# Patient Record
Sex: Male | Born: 1950
Health system: Southern US, Community
[De-identification: ages and names within clinical notes are randomized; demographics above are authoritative.]

## PROBLEM LIST (undated history)

## (undated) DIAGNOSIS — I700 Atherosclerosis of aorta: Secondary | ICD-10-CM

## (undated) DIAGNOSIS — I10 Essential (primary) hypertension: Secondary | ICD-10-CM

## (undated) DIAGNOSIS — K224 Dyskinesia of esophagus: Secondary | ICD-10-CM

## (undated) DIAGNOSIS — G20 Parkinson's disease: Principal | ICD-10-CM

## (undated) DIAGNOSIS — K921 Melena: Secondary | ICD-10-CM

## (undated) DIAGNOSIS — E7800 Pure hypercholesterolemia, unspecified: Secondary | ICD-10-CM

## (undated) HISTORY — DX: Essential (primary) hypertension: I10

## (undated) HISTORY — DX: Atherosclerosis of aorta: I70.0

## (undated) HISTORY — DX: Melena: K92.1

## (undated) HISTORY — DX: Dyskinesia of esophagus: K22.4

## (undated) HISTORY — DX: Pure hypercholesterolemia, unspecified: E78.00

## (undated) HISTORY — DX: Parkinson's disease: G20

---

## 2000-03-27 ENCOUNTER — Ambulatory Visit (HOSPITAL_COMMUNITY): Admission: RE | Admit: 2000-03-27 | Discharge: 2000-03-27 | Payer: Self-pay | Admitting: Gastroenterology

## 2000-03-27 ENCOUNTER — Encounter (INDEPENDENT_AMBULATORY_CARE_PROVIDER_SITE_OTHER): Payer: Self-pay | Admitting: *Deleted

## 2001-02-06 ENCOUNTER — Emergency Department (HOSPITAL_COMMUNITY): Admission: EM | Admit: 2001-02-06 | Discharge: 2001-02-06 | Payer: Self-pay | Admitting: Emergency Medicine

## 2002-06-14 ENCOUNTER — Encounter: Admission: RE | Admit: 2002-06-14 | Discharge: 2002-06-14 | Payer: Self-pay | Admitting: Family Medicine

## 2002-06-14 ENCOUNTER — Encounter: Payer: Self-pay | Admitting: Family Medicine

## 2002-07-06 ENCOUNTER — Encounter: Payer: Self-pay | Admitting: Neurology

## 2002-07-06 ENCOUNTER — Ambulatory Visit (HOSPITAL_COMMUNITY): Admission: RE | Admit: 2002-07-06 | Discharge: 2002-07-06 | Payer: Self-pay | Admitting: Neurology

## 2002-07-08 ENCOUNTER — Ambulatory Visit (HOSPITAL_COMMUNITY): Admission: RE | Admit: 2002-07-08 | Discharge: 2002-07-08 | Payer: Self-pay | Admitting: Neurology

## 2006-05-19 ENCOUNTER — Emergency Department (HOSPITAL_COMMUNITY): Admission: EM | Admit: 2006-05-19 | Discharge: 2006-05-19 | Payer: Self-pay | Admitting: Emergency Medicine

## 2008-10-17 DIAGNOSIS — K921 Melena: Secondary | ICD-10-CM

## 2008-10-17 HISTORY — DX: Melena: K92.1

## 2010-06-23 ENCOUNTER — Ambulatory Visit (HOSPITAL_COMMUNITY): Admission: RE | Admit: 2010-06-23 | Discharge: 2010-06-23 | Payer: Self-pay | Admitting: General Surgery

## 2010-06-23 HISTORY — PX: HERNIA REPAIR: SHX51

## 2010-12-30 LAB — BASIC METABOLIC PANEL
BUN: 19 mg/dL (ref 6–23)
CO2: 27 mEq/L (ref 19–32)
Calcium: 9.5 mg/dL (ref 8.4–10.5)
Chloride: 107 mEq/L (ref 96–112)
Creatinine, Ser: 1.1 mg/dL (ref 0.4–1.5)
GFR calc Af Amer: >60 mL/min (ref >60)
GFR calc non Af Amer: >60 mL/min (ref >60)
Glucose, Bld: 109 mg/dL — ABNORMAL HIGH (ref 70–99)
Potassium: 3.6 mEq/L (ref 3.5–5.1)
Sodium: 140 mEq/L (ref 135–145)

## 2010-12-30 LAB — CBC
Hemoglobin: 14.5 g/dL (ref 13.0–17.0)
MCHC: 34.4 g/dL (ref 30.0–36.0)
Platelets: 261 10*3/uL (ref 150–400)
RDW: 12.9 % (ref 11.5–15.5)

## 2010-12-30 LAB — SURGICAL PCR SCREEN
MRSA, PCR: NEGATIVE
Staphylococcus aureus: POSITIVE — AB

## 2010-12-30 LAB — DIFFERENTIAL
Basophils Absolute: 0 10*3/uL (ref 0.0–0.1)
Basophils Relative: 1 % (ref 0–1)
Neutro Abs: 4.9 10*3/uL (ref 1.7–7.7)
Neutrophils Relative %: 64 % (ref 43–77)

## 2011-03-04 NOTE — Op Note (Signed)
   NAME:  Jeremy Davidson, Jeremy Davidson NO.:  [id number]   MEDICAL RECORD NO.:  [id number]                   PATIENT TYPE:  OUT   LOCATION:  [facility]                                  FACILITY:  [facility]   PHYSICIAN:  Marlan Palau, M.D.               DATE OF BIRTH:  12/05/1950   DATE OF PROCEDURE:  07/08/2002  DATE OF DISCHARGE:                                 OPERATIVE REPORT   PROCEDURE:  Lumbar puncture.   HISTORY:  This is a 60 year old gentleman with paresthesias in both lower  extremities and the left upper extremity.  This patient is being evaluated  for possible transverse myelitis.  Rule out demyelinating disease.   DESCRIPTION OF PROCEDURE:  Lumbar puncture was performed with the patient in  the fetal position on the left side.  The low back was cleaned with Betadine  solution, and approximately 2 cc of 1% Xylocaine was used as a local  anesthetic.  A 20-gauge spinal needle was inserted in the L3-4 interspace,  and approximately 18 cc of clear, colorless spinal fluid was removed for  testing.  Opening pressure was 180 mmH2O.  Tube #1 was sent for VDRL,  cryptococcal antigen; tube #2 was sent for oligoclonal banding, IgG/albumin  ratio; tube #3 was sent for cells, differential, glucose, protein; tube #4  was sent for Lyme antibody panel.  Blood work was also obtained for  sedimentation rate, ANA, rheumatoid factor, ANCA level, antiphospholipid  antibody panel, protein S, protein C, antithrombin-3 level, homocysteine  level.  The patient tolerated the procedure well.  No complications of the  above procedure were noted.                                               Marlan Palau, M.D.    CKW/MEDQ  D:  07/08/2002  T:  07/09/2002  Job:  [id number]

## 2011-03-04 NOTE — Procedures (Signed)
[place]  Patient:    Jeremy Davidson, Jeremy Davidson                    MRN: [id number] Proc. Date: 03/27/00 Adm. Date:  [id number] Disc. Date: [id number] Attending:  Nelda Marseille CC:         Vale [place]. Andrey Campanile, M.D.                           Procedure Report  PROCEDURE PERFORMED:  Colonoscopy with biopsy.  INDICATION:  The patient with a family history of colon cancer due for colonic screening. Some bright red blood per rectum. Consent was signed after risks benefits, methods and options were thoroughly discussed in the office.  MEDICATIONS USED: Demerol 60 mg, Versed 6 mg.  DESCRIPTION OF PROCEDURE:  Rectal inspection is pertinent for external hemorrhoids. Digital exam was negative.  Video colonoscope was inserted and easily advanced around the colon to the cecum. This did not require any abdominal pressure or any position changes.  The cecum was identified by the appendiceal orifice and the ileocecal valve. In fact, the scope was inserted a short way into the terminal ileum which was normal. Photo documentation was obtained.  The scope was then slowly withdrawn and the prep was adequate. There was some liquid stool that required washing and suctioning.  On slow withdrawal through the cecum, ascending, transverse, descending and majority of the sigmoid were normal and the rectal sigmoid junction two tiny hyperplastic appearing polyps were seen and were each cold biopsied times one to two.  The scope as withdrawn back to the rectum and retroflexed, pertinent for some internal hemorrhoids.  The scope was straightened and readvanced a short way of the cecum.  The air was suctioned and the scope removed.  The patient tolerated the procedure well. There was no evidence of immediate complication.  ENDOSCOPIC DIAGNOSES: 1. Internal and external hemorrhoids. 2. Two tiny hyperplastic appearing rectal sigmoid junction polyps status post    cold biopsy. 3.  Otherwise within normal limits to the terminal ileum.  PLAN:  Yearly rectals and guaiac per Dr. Andrey Campanile.  GI follow up p.r.n.  Await pathology.  Probably recheck colon screening in five years. DD:  03/27/00 TD:  03/29/00 Job: [id number]

## 2011-10-17 ENCOUNTER — Other Ambulatory Visit: Payer: Self-pay | Admitting: Family Medicine

## 2011-10-17 DIAGNOSIS — R42 Dizziness and giddiness: Secondary | ICD-10-CM

## 2011-10-21 ENCOUNTER — Ambulatory Visit
Admission: RE | Admit: 2011-10-21 | Discharge: 2011-10-21 | Disposition: A | Discharge status: Home / Self Care | Payer: BC Managed Care – PPO | Source: Ambulatory Visit | Attending: Family Medicine | Admitting: Family Medicine

## 2011-10-21 ENCOUNTER — Other Ambulatory Visit: Payer: Self-pay

## 2011-10-21 DIAGNOSIS — R42 Dizziness and giddiness: Secondary | ICD-10-CM

## 2014-05-27 ENCOUNTER — Other Ambulatory Visit: Payer: Self-pay | Admitting: Family Medicine

## 2014-05-27 DIAGNOSIS — Z139 Encounter for screening, unspecified: Secondary | ICD-10-CM

## 2014-06-04 ENCOUNTER — Ambulatory Visit: Payer: BC Managed Care – PPO

## 2014-06-06 ENCOUNTER — Encounter (INDEPENDENT_AMBULATORY_CARE_PROVIDER_SITE_OTHER): Payer: Self-pay

## 2014-06-06 ENCOUNTER — Ambulatory Visit
Admission: RE | Admit: 2014-06-06 | Discharge: 2014-06-06 | Disposition: A | Discharge status: Home / Self Care | Payer: Medicare HMO | Source: Ambulatory Visit | Attending: Family Medicine | Admitting: Family Medicine

## 2014-06-06 DIAGNOSIS — Z139 Encounter for screening, unspecified: Secondary | ICD-10-CM

## 2015-07-20 DIAGNOSIS — Z23 Encounter for immunization: Secondary | ICD-10-CM | POA: Diagnosis not present

## 2015-09-09 ENCOUNTER — Ambulatory Visit (INDEPENDENT_AMBULATORY_CARE_PROVIDER_SITE_OTHER): Payer: Medicare HMO | Admitting: Emergency Medicine

## 2015-09-09 VITALS — BP 128/72 | HR 71 | Temp 97.4°F | Resp 19 | Ht 71.0 in | Wt 188.0 lb

## 2015-09-09 DIAGNOSIS — I10 Essential (primary) hypertension: Secondary | ICD-10-CM | POA: Diagnosis not present

## 2015-09-09 DIAGNOSIS — E785 Hyperlipidemia, unspecified: Secondary | ICD-10-CM | POA: Diagnosis not present

## 2015-09-09 DIAGNOSIS — L03012 Cellulitis of left finger: Secondary | ICD-10-CM

## 2015-09-09 HISTORY — DX: Essential (primary) hypertension: I10

## 2015-09-09 HISTORY — DX: Hyperlipidemia, unspecified: E78.5

## 2015-09-09 MED ORDER — DOXYCYCLINE HYCLATE 100 MG PO TABS: 100.0000 mg | ORAL_TABLET | Freq: Two times a day (BID) | ORAL | Status: DC

## 2015-09-09 NOTE — Patient Instructions (Signed)
Paronychia °Paronychia is an infection of the skin that surrounds a nail. It usually affects the skin around a fingernail, but it may also occur near a toenail. It often causes pain and swelling around the nail. This condition may come on suddenly or develop over a longer period. In some cases, a collection of pus (abscess) can form near or under the nail. Usually, paronychia is not serious and it clears up with treatment. °CAUSES °This condition may be caused by bacteria or fungi. It is commonly caused by either Streptococcus or Staphylococcus bacteria. The bacteria or fungi often cause the infection by getting into the affected area through an opening in the skin, such as a cut or a hangnail. °RISK FACTORS °This condition is more likely to develop in: °· People who get their hands wet often, such as those who work as dishwashers, bartenders, or nurses. °· People who bite their fingernails or suck their thumbs. °· People who trim their nails too short. °· People who have hangnails or injured fingertips. °· People who get manicures. °· People who have diabetes. °SYMPTOMS °Symptoms of this condition include: °· Redness and swelling of the skin near the nail. °· Tenderness around the nail when you touch the area. °· Pus-filled bumps under the cuticle. The cuticle is the skin at the base or sides of the nail. °· Fluid or pus under the nail. °· Throbbing pain in the area. °DIAGNOSIS °This condition is usually diagnosed with a physical exam. In some cases, a sample of pus may be taken from an abscess to be tested in a lab. This can help to determine what type of bacteria or fungi is causing the condition. °TREATMENT °Treatment for this condition depends on the cause and severity of the condition. If the condition is mild, it may clear up on its own in a few days. Your health care provider may recommend soaking the affected area in warm water a few times a day. When treatment is needed, the options may  include: °· Antibiotic medicine, if the condition is caused by a bacterial infection. °· Antifungal medicine, if the condition is caused by a fungal infection. °· Incision and drainage, if an abscess is present. In this procedure, the health care provider will cut open the abscess so the pus can drain out. °HOME CARE INSTRUCTIONS °· Soak the affected area in warm water if directed to do so by your health care provider. You may be told to do this for 20 minutes, 2-3 times a day. Keep the area dry in between soakings. °· Take medicines only as directed by your health care provider. °· If you were prescribed an antibiotic medicine, finish all of it even if you start to feel better. °· Keep the affected area clean. °· Do not try to drain a fluid-filled bump yourself. °· If you will be washing dishes or performing other tasks that require your hands to get wet, wear rubber gloves. You should also wear gloves if your hands might come in contact with irritating substances, such as cleaners or chemicals. °· Follow your health care provider's instructions about: °¨ Wound care. °¨ Bandage (dressing) changes and removal. °SEEK MEDICAL CARE IF: °· Your symptoms get worse or do not improve with treatment. °· You have a fever or chills. °· You have redness spreading from the affected area. °· You have continued or increased fluid, blood, or pus coming from the affected area. °· Your finger or knuckle becomes swollen or is difficult to move. °  °  This information is not intended to replace advice given to you by your health care provider. Make sure you discuss any questions you have with your health care provider. °  °Document Released: 03/29/2001 Document Revised: 02/17/2015 Document Reviewed: 09/10/2014 °Elsevier Interactive Patient Education ©2016 [employer] ° °

## 2015-09-09 NOTE — Addendum Note (Signed)
Addended by: Arlyss Queen A on: 09/09/2015 02:54 PM   Modules accepted: Level of Service

## 2015-09-09 NOTE — Progress Notes (Signed)
Patient ID: Jeremy Davidson, male   DOB: [birth year], 64 y.o.   MRN: [id number]     This chart was scribed for Arlyss Queen, MD by Zola Button, Medical Scribe. This patient was seen in room 3 and the patient's care was started at 2:24 PM.   Chief Complaint:  Chief Complaint  Patient presents with  . Other    lft.thumb swelling     HPI: Jeremy Davidson is a 64 y.o. male who reports to [facility] today complaining of gradual onset, left thumb pain that started about a week ago. He has noticed an area of swelling and redness within the past few days. Patient denies known injury and history of DM. He believes he is UTD on tetanus. He has been receiving most of his care at [facility].  Patient has 3 children and 6 grandchildren, soon to be 66.  Past Medical History  Diagnosis Date  . Hypertension   . Elevated cholesterol   . Blood in stool 2010    Colonoscopy benign repeat 2015   Past Surgical History  Procedure Laterality Date  . Hernia repair  06/23/2010    With large ultra pro hrnia system-- Dr Donne Hazel 1971   Social History   Social History  . Marital Status: Married    Spouse Name: N/A  . Number of Children: N/A  . Years of Education: N/A   Social History Main Topics  . Smoking status: Former Smoker    Quit date: 10/17/1998  . Smokeless tobacco: None  . Alcohol Use: Yes  . Drug Use: None  . Sexual Activity: Not Asked   Other Topics Concern  . None   Social History Narrative   History reviewed. No pertinent family history. No Known Allergies Prior to Admission medications   Medication Sig Start Date End Date Taking? Authorizing Provider  atorvastatin (LIPITOR) 40 MG tablet Take 40 mg by mouth at bedtime.   Yes Historical Provider, MD  hydrochlorothiazide (HYDRODIURIL) 25 MG tablet Take 25 mg by mouth daily.   Yes Historical Provider, MD  Sildenafil Citrate (VIAGRA PO) Take by mouth as needed.   Yes Historical Provider, MD     ROS: The patient denies fevers,  chills, night sweats, unintentional weight loss, chest pain, palpitations, wheezing, dyspnea on exertion, nausea, vomiting, abdominal pain, dysuria, hematuria, melena, numbness, weakness, or tingling.  All other systems have been reviewed and were otherwise negative with the exception of those mentioned in the HPI and as above.    PHYSICAL EXAM: Filed Vitals:   09/09/15 1420  BP: 128/72  Pulse: 71  Temp: 97.4 F (36.3 C)  Resp: 19   Body mass index is 26.23 kg/(m^2).   General: Alert, no acute distress HEENT:  Normocephalic, atraumatic, oropharynx patent. Eye: Jeremy Davidson [facility] Cardiovascular:  Regular rate and rhythm, no rubs murmurs or gallops.  No Carotid bruits, radial pulse intact. No pedal edema.  Respiratory: Clear to auscultation bilaterally.  No wheezes, rales, or rhonchi.  No cyanosis, no use of accessory musculature Abdominal: No organomegaly, abdomen is soft and non-tender, positive bowel sounds.  No masses. Musculoskeletal: Gait intact. No edema, tenderness Skin: He has a 1 cm x 0.75 cm purulent blister, medial left thumb with mild redness of the cuticle. Neurologic: Facial musculature symmetric. Psychiatric: Patient acts appropriately throughout our interaction. Lymphatic: No cervical or submandibular lymphadenopathy Procedure note The thumb was prepped with Betadine half centimeter incision made over the wound abscess with a droplet of purulent material obtained and sent for  culture.  LABS:    EKG/XRAY:   Primary read interpreted by Dr. Everlene Farrier at [facility].   ASSESSMENT/PLAN: Patient presents with a paronychia with abscess. The abscess was opened and sent for culture. He will be on doxycycline as well as soap and water cleaning.I personally performed the services described in this documentation, which was scribed in my presence. The recorded information has been reviewed and is accurate.  By signing my name below, I, Zola Button, attest that this documentation has been  prepared under the direction and in the presence of Arlyss Queen, MD.  Electronically Signed: Zola Button, Medical Scribe. 09/09/2015. 2:24 PM.   Gross sideeffects, risk and benefits, and alternatives of medications d/w patient. Patient is aware that all medications have potential sideeffects and we are unable to predict every sideeffect or drug-drug interaction that may occur.  Arlyss Queen MD 09/09/2015 2:24 PM

## 2015-09-12 ENCOUNTER — Telehealth: Payer: Self-pay | Admitting: *Deleted

## 2015-09-12 DIAGNOSIS — L03012 Cellulitis of left finger: Secondary | ICD-10-CM

## 2015-09-12 LAB — WOUND CULTURE: GRAM STAIN: NONE SEEN

## 2015-09-12 MED ORDER — CIPROFLOXACIN HCL 500 MG PO TABS: 500.0000 mg | ORAL_TABLET | Freq: Two times a day (BID) | ORAL | Status: DC

## 2015-09-12 NOTE — Telephone Encounter (Signed)
Pt called about lab results of his wound culture.  He has not picked up his medication because its $29 and wanted to know if he really need it or not.   Advise he take Cipro 500 BID for 5 days. # 10 no refills. Thanks.  Land[place]. Do not take Doxy.  Cipro sent into pharmacy  Pt called and notified

## 2015-09-15 DIAGNOSIS — R69 Illness, unspecified: Secondary | ICD-10-CM | POA: Diagnosis not present

## 2015-10-30 DIAGNOSIS — L570 Actinic keratosis: Secondary | ICD-10-CM | POA: Diagnosis not present

## 2015-10-30 DIAGNOSIS — D1801 Hemangioma of skin and subcutaneous tissue: Secondary | ICD-10-CM | POA: Diagnosis not present

## 2015-10-30 DIAGNOSIS — D225 Melanocytic nevi of trunk: Secondary | ICD-10-CM | POA: Diagnosis not present

## 2015-10-30 DIAGNOSIS — L821 Other seborrheic keratosis: Secondary | ICD-10-CM | POA: Diagnosis not present

## 2015-10-30 DIAGNOSIS — L814 Other melanin hyperpigmentation: Secondary | ICD-10-CM | POA: Diagnosis not present

## 2015-11-04 ENCOUNTER — Other Ambulatory Visit: Payer: Self-pay | Admitting: Emergency Medicine

## 2016-02-29 DIAGNOSIS — B029 Zoster without complications: Secondary | ICD-10-CM | POA: Diagnosis not present

## 2016-02-29 DIAGNOSIS — K068 Other specified disorders of gingiva and edentulous alveolar ridge: Secondary | ICD-10-CM | POA: Diagnosis not present

## 2016-03-02 DIAGNOSIS — Z0000 Encounter for general adult medical examination without abnormal findings: Secondary | ICD-10-CM | POA: Diagnosis not present

## 2016-03-02 DIAGNOSIS — E785 Hyperlipidemia, unspecified: Secondary | ICD-10-CM | POA: Diagnosis not present

## 2016-03-02 DIAGNOSIS — I10 Essential (primary) hypertension: Secondary | ICD-10-CM | POA: Diagnosis not present

## 2016-03-15 DIAGNOSIS — R69 Illness, unspecified: Secondary | ICD-10-CM | POA: Diagnosis not present

## 2016-03-23 DIAGNOSIS — Z23 Encounter for immunization: Secondary | ICD-10-CM | POA: Diagnosis not present

## 2016-04-12 DIAGNOSIS — N5201 Erectile dysfunction due to arterial insufficiency: Secondary | ICD-10-CM | POA: Diagnosis not present

## 2016-04-12 DIAGNOSIS — Z125 Encounter for screening for malignant neoplasm of prostate: Secondary | ICD-10-CM | POA: Diagnosis not present

## 2016-04-12 DIAGNOSIS — R351 Nocturia: Secondary | ICD-10-CM | POA: Diagnosis not present

## 2016-05-08 DIAGNOSIS — S91111A Laceration without foreign body of right great toe without damage to nail, initial encounter: Secondary | ICD-10-CM | POA: Diagnosis not present

## 2016-07-06 DIAGNOSIS — Z23 Encounter for immunization: Secondary | ICD-10-CM | POA: Diagnosis not present

## 2016-07-08 IMAGING — US US AORTA SCREENING (MEDICARE)
1 series · 13 of 13 positions shown · non-contrast
Comparison: None.

CLINICAL DATA: Medicare screening exam for abdominal aortic
aneurysm.

EXAM:
ABDOMINAL AORTA SCREENING ULTRASOUND
TECHNIQUE: Ultrasound examination of the abdominal aorta was performed as a
screening evaluation for abdominal aortic aneurysm.

[Series 1: us aorta screening (medicare) · 0.41mm/px · 13 of 13 slices shown]
[im 1/13]
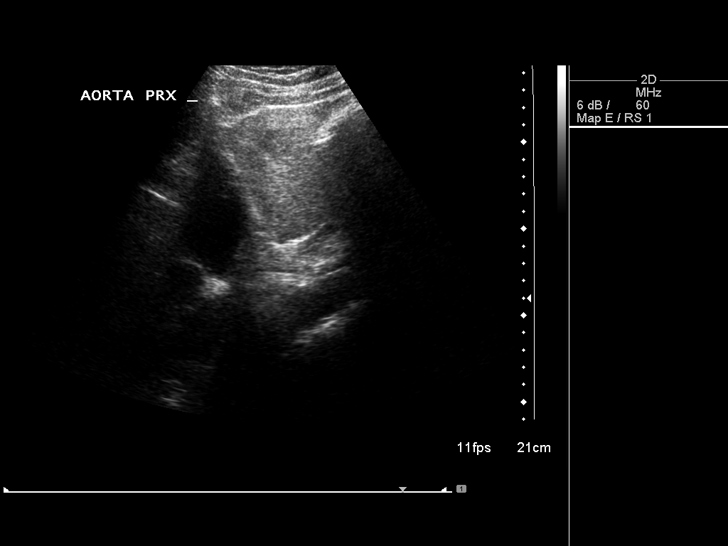
[im 2/13]
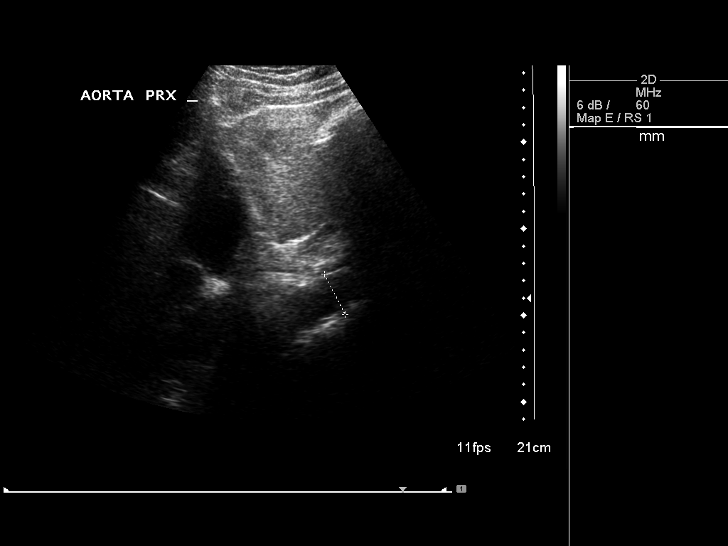
[im 3/13]
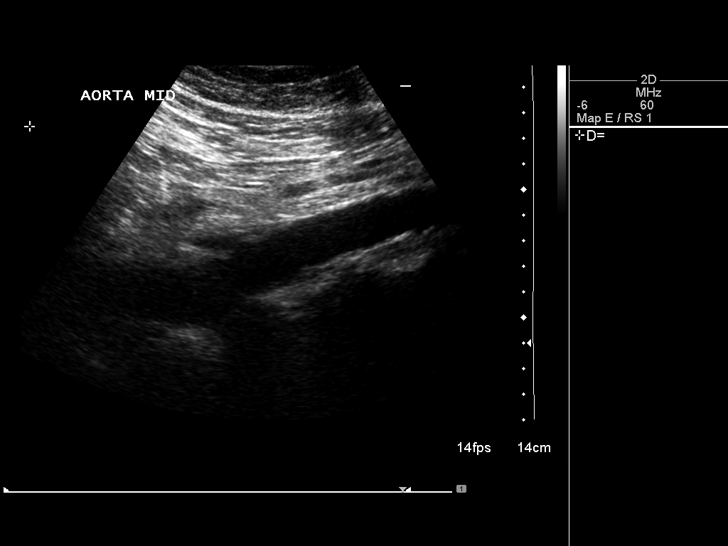
[im 4/13]
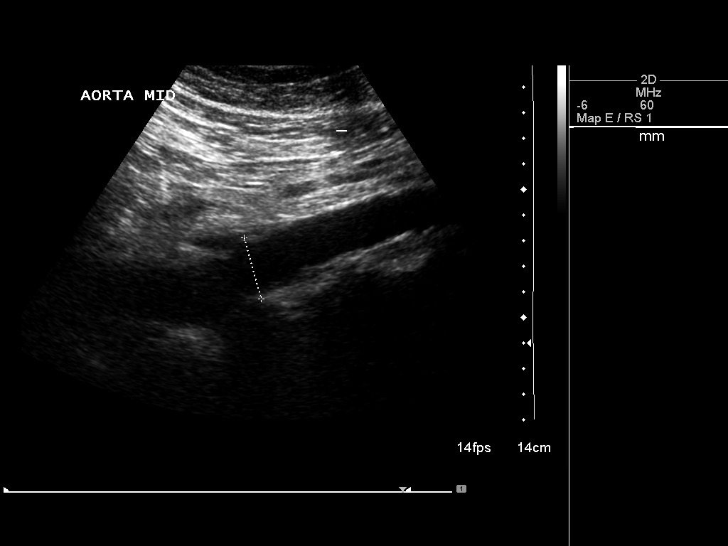
[im 5/13]
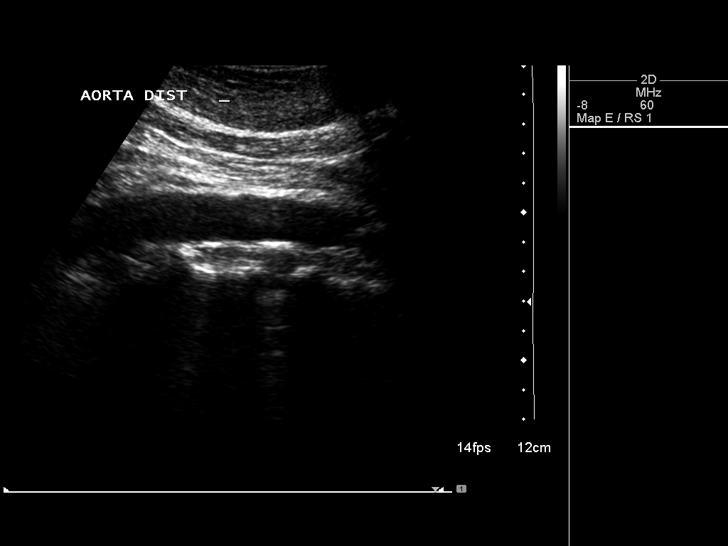
[im 6/13]
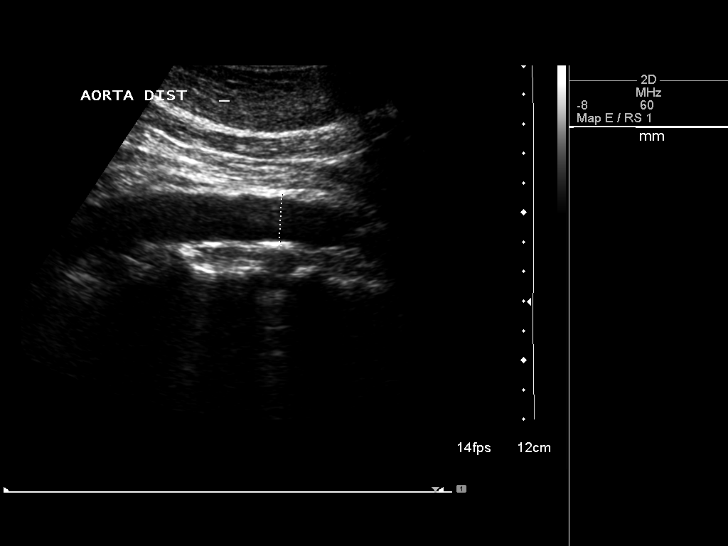
[im 7/13]
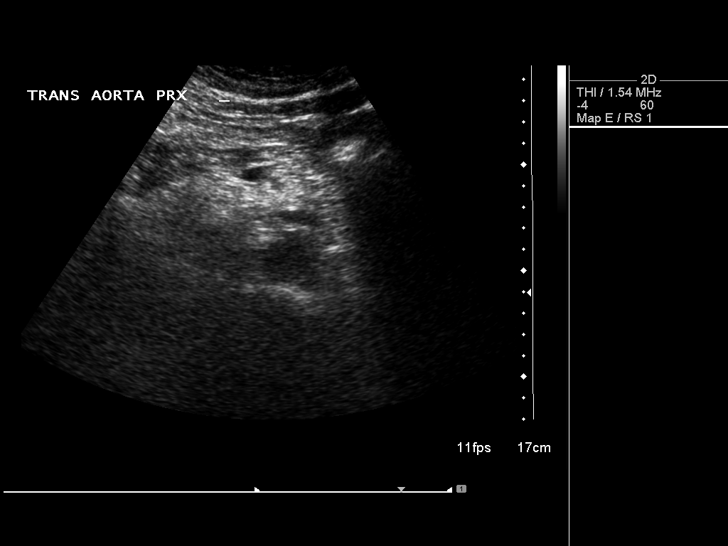
[im 8/13]
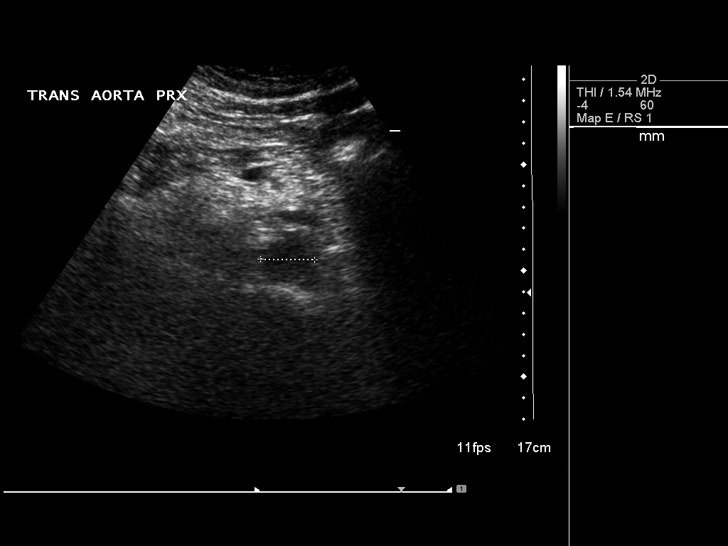
[im 9/13]
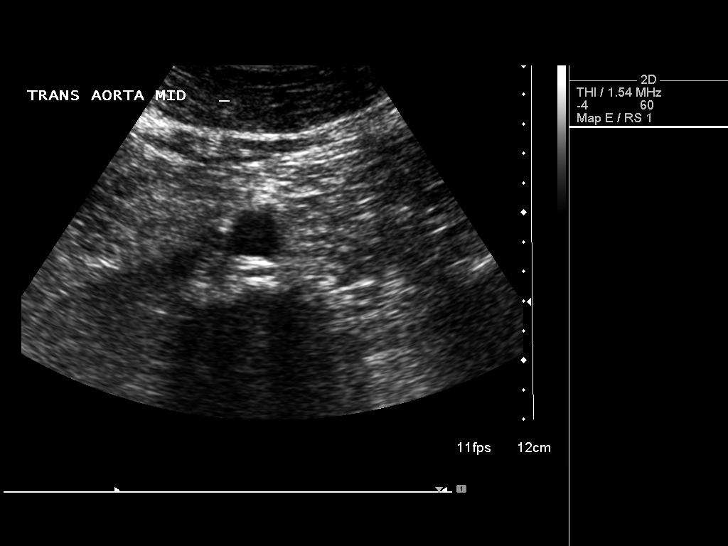
[im 10/13]
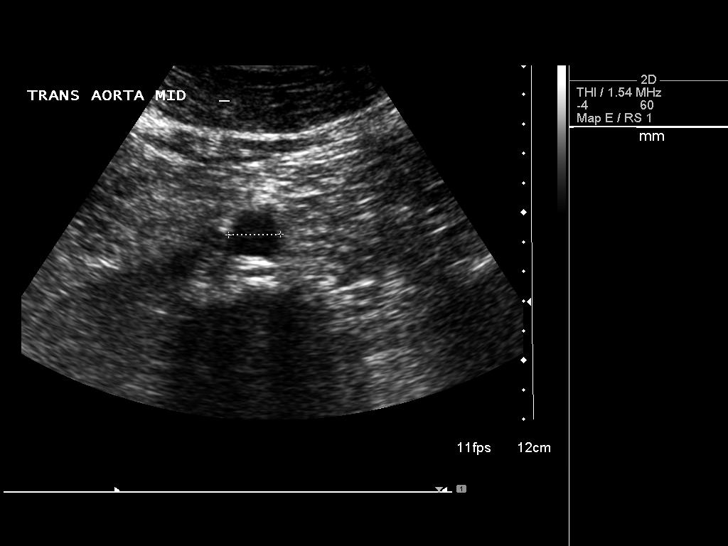
[im 11/13]
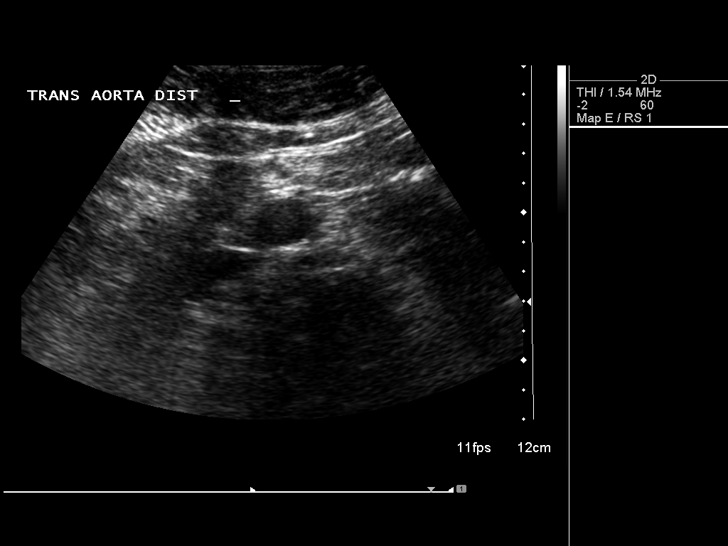
[im 12/13]
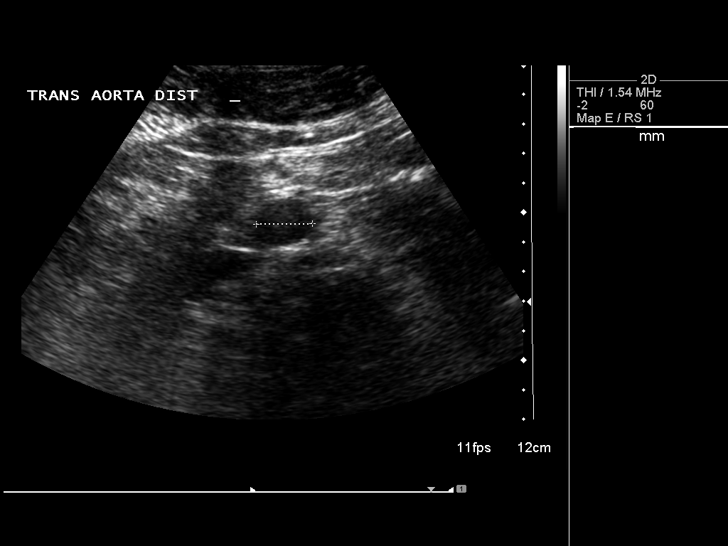
[im 13/13]
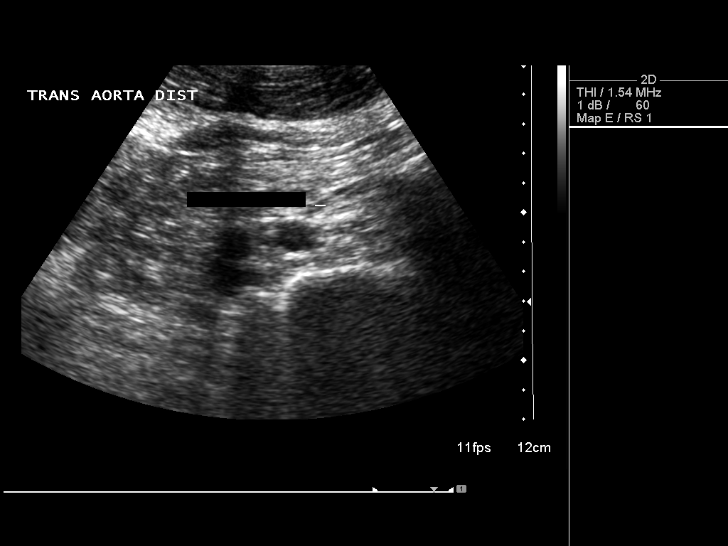

[13 of 13 positions shown; findings below may reference images not displayed]

FINDINGS: Abdominal Aorta

No aneurysm identified. There is slight atherosclerosis in the
abdominal aorta.

Maximum AP

Diameter:  2.6 cm

Maximum TRV

Diameter: 2.6 cm
IMPRESSION: No evidence of abdominal aortic aneurysm. Slight atherosclerotic
irregularity.

## 2016-07-11 DIAGNOSIS — H52223 Regular astigmatism, bilateral: Secondary | ICD-10-CM | POA: Diagnosis not present

## 2016-07-11 DIAGNOSIS — H524 Presbyopia: Secondary | ICD-10-CM | POA: Diagnosis not present

## 2016-07-11 DIAGNOSIS — H2513 Age-related nuclear cataract, bilateral: Secondary | ICD-10-CM | POA: Diagnosis not present

## 2016-07-11 DIAGNOSIS — H35371 Puckering of macula, right eye: Secondary | ICD-10-CM | POA: Diagnosis not present

## 2016-07-11 DIAGNOSIS — H5203 Hypermetropia, bilateral: Secondary | ICD-10-CM | POA: Diagnosis not present

## 2016-09-14 DIAGNOSIS — R69 Illness, unspecified: Secondary | ICD-10-CM | POA: Diagnosis not present

## 2016-10-12 DIAGNOSIS — R69 Illness, unspecified: Secondary | ICD-10-CM | POA: Diagnosis not present

## 2016-10-21 DIAGNOSIS — D225 Melanocytic nevi of trunk: Secondary | ICD-10-CM | POA: Diagnosis not present

## 2016-10-21 DIAGNOSIS — L570 Actinic keratosis: Secondary | ICD-10-CM | POA: Diagnosis not present

## 2016-10-21 DIAGNOSIS — C44519 Basal cell carcinoma of skin of other part of trunk: Secondary | ICD-10-CM | POA: Diagnosis not present

## 2016-10-21 DIAGNOSIS — L821 Other seborrheic keratosis: Secondary | ICD-10-CM | POA: Diagnosis not present

## 2016-10-21 DIAGNOSIS — Z85828 Personal history of other malignant neoplasm of skin: Secondary | ICD-10-CM | POA: Diagnosis not present

## 2016-10-21 DIAGNOSIS — L814 Other melanin hyperpigmentation: Secondary | ICD-10-CM | POA: Diagnosis not present

## 2016-12-13 DIAGNOSIS — Z0001 Encounter for general adult medical examination with abnormal findings: Secondary | ICD-10-CM | POA: Diagnosis not present

## 2016-12-13 DIAGNOSIS — S86911A Strain of unspecified muscle(s) and tendon(s) at lower leg level, right leg, initial encounter: Secondary | ICD-10-CM | POA: Diagnosis not present

## 2016-12-13 DIAGNOSIS — Z79899 Other long term (current) drug therapy: Secondary | ICD-10-CM | POA: Diagnosis not present

## 2016-12-13 DIAGNOSIS — E7800 Pure hypercholesterolemia, unspecified: Secondary | ICD-10-CM | POA: Diagnosis not present

## 2016-12-13 DIAGNOSIS — I10 Essential (primary) hypertension: Secondary | ICD-10-CM | POA: Diagnosis not present

## 2017-03-14 DIAGNOSIS — G252 Other specified forms of tremor: Secondary | ICD-10-CM | POA: Diagnosis not present

## 2017-03-14 DIAGNOSIS — R269 Unspecified abnormalities of gait and mobility: Secondary | ICD-10-CM | POA: Diagnosis not present

## 2017-03-16 DIAGNOSIS — R69 Illness, unspecified: Secondary | ICD-10-CM | POA: Diagnosis not present

## 2017-03-17 ENCOUNTER — Encounter: Payer: Self-pay | Admitting: Neurology

## 2017-03-17 ENCOUNTER — Encounter (INDEPENDENT_AMBULATORY_CARE_PROVIDER_SITE_OTHER): Payer: Self-pay

## 2017-03-17 ENCOUNTER — Ambulatory Visit (INDEPENDENT_AMBULATORY_CARE_PROVIDER_SITE_OTHER): Payer: Medicare HMO | Admitting: Neurology

## 2017-03-17 DIAGNOSIS — G20A1 Parkinson's disease without dyskinesia, without mention of fluctuations: Secondary | ICD-10-CM

## 2017-03-17 DIAGNOSIS — G20 Parkinson's disease: Secondary | ICD-10-CM | POA: Diagnosis not present

## 2017-03-17 HISTORY — DX: Parkinson's disease without dyskinesia, without mention of fluctuations: G20.A1

## 2017-03-17 HISTORY — DX: Parkinson's disease: G20

## 2017-03-17 MED ORDER — PRAMIPEXOLE DIHYDROCHLORIDE 0.125 MG PO TABS: ORAL_TABLET | ORAL | 2 refills | Status: DC

## 2017-03-17 NOTE — Patient Instructions (Signed)
   We will start Mirapex and gradually work up on the dose.  We will check MRI of the brain.  Mirapex (pramipexole) may result in confusion or hallucinations, dizziness, drowsiness, or nausea. If any significant side effects are noted, please contact our office.

## 2017-03-17 NOTE — Progress Notes (Signed)
Reason for visit: Tremors  Referring physician: Dr. Gerlene Fee is a 66 y.o. male  History of present illness:  Jeremy Davidson is a 66 year old right-handed white male with a history of onset of tremor that affected the left arm and some with the left leg that began about one month ago. The patient was on a recent beach trip with the family, his children noted that he was shuffling his feet. His wife has not noted any change in his walking. The patient is quite active, he runs and he works out on a regular basis. He has not noted any change in balance, with no falls, and he has not noted any slowness of activities throughout the day. His running times have not changed. He has not noted any change in speech or swallowing. He has noted some alteration in his handwriting with more sloppy handwriting. He denies any numbness or weakness of the face, arms, or legs. He has not had any troubles controlling the bowels or the bladder. He reports no problems with memory or concentration. There is no family history of tremor, he has a paternal aunt with ALS. The patient is sent to this office for an evaluation of possible Parkinson's disease.  Past Medical History:  Diagnosis Date  . Blood in stool 2010   Colonoscopy benign repeat 2015  . Elevated cholesterol   . Hypertension     Past Surgical History:  Procedure Laterality Date  . HERNIA REPAIR  06/23/2010   With large ultra pro hrnia system-- Dr Donne Hazel 1971. x2    History reviewed. No pertinent family history.  Social history:  reports that he quit smoking about 18 years ago. He has never used smokeless tobacco. He reports that he drinks alcohol. He reports that he does not use drugs.  Medications:  Prior to Admission medications   Medication Sig Start Date End Date Taking? Authorizing Provider  aspirin 81 MG tablet Take 81 mg by mouth daily.   Yes [provider]  atorvastatin (LIPITOR) 40 MG tablet Take 40 mg by  mouth at bedtime.   Yes [provider]  hydrochlorothiazide (HYDRODIURIL) 25 MG tablet Take 25 mg by mouth daily.   Yes [provider]  Sildenafil Citrate (VIAGRA PO) Take by mouth as needed.   Yes [provider]     No Known Allergies  ROS:  Out of a complete 14 system review of symptoms, the patient complains only of the following symptoms, and all other reviewed systems are negative.  Fatigue Weakness Tremor  Blood pressure (!) 145/90, pulse 81, height 6' (1.829 m), weight 183 lb 8 oz (83.2 kg).  Physical Exam  General: The patient is alert and cooperative at the time of the examination.  Eyes: Pupils are equal, round, and reactive to light. Discs are flat bilaterally.  Neck: The neck is supple, no carotid bruits are noted.  Respiratory: The respiratory examination is clear.  Cardiovascular: The cardiovascular examination reveals a regular rate and rhythm, no obvious murmurs or rubs are noted.  Skin: Extremities are without significant edema.  Neurologic Exam  Mental status: The patient is alert and oriented x 3 at the time of the examination. The patient has apparent normal recent and remote memory, with an apparently normal attention span and concentration ability.  Cranial nerves: Facial symmetry is present. There is good sensation of the face to pinprick and soft touch bilaterally. The strength of the facial muscles and the muscles to head  turning and shoulder shrug are normal bilaterally. Speech is well enunciated, no aphasia or dysarthria is noted. Extraocular movements are full. Visual fields are full. The tongue is midline, and the patient has symmetric elevation of the soft palate. No obvious hearing deficits are noted. Very minimal masking of the face was seen.  Motor: The motor testing reveals 5 over 5 strength of all 4 extremities. Good symmetric motor tone is noted throughout.  Sensory: Sensory testing is intact to pinprick, soft  touch, vibration sensation, and position sense on all 4 extremities. No evidence of extinction is noted.  Coordination: Cerebellar testing reveals good finger-nose-finger and heel-to-shin bilaterally. Resting tremors were noted on the left upper and left lower extremities.  Gait and station: The patient is able to arise from a seated position with arms crossed. Once up, the patient is able to ambulate without difficulty, there is decreased arm swing on the left arm as compared to the right, tremor seen with the left arm while walking. Tandem gait is normal. Romberg is negative. No drift is seen.  Reflexes: Deep tendon reflexes are symmetric and normal bilaterally. Toes are downgoing bilaterally.   MRI brain 10/21/11:  IMPRESSION:  1.  Mild periventricular subcortical white matter disease is slightly advanced for age. The finding is nonspecific but can be seen in the setting of chronic microvascular ischemia, a demyelinating process such as multiple sclerosis, vasculitis, complicated migraine headaches, or as the sequelae of a prior infectious or inflammatory process. 2.  No acute or focal abnormality to explain the patient's symptoms of dizziness.    Assessment/Plan:  1. Parkinson's disease with left-sided features  The patient will be set up for MRI of the brain, a prior MRI done in 2013 did show some degree of small vessel disease. The patient will be placed on Mirapex and the dose will be increased gradually over time. The patient will call me for any dose adjustments or for side effects. He will follow-up in 5 months. He is to remain active.  Jill Alexanders MD 03/17/2017 8:57 AM  [facility] [address] [place], [place] [address]  Phone [phone] Fax [phone]

## 2017-03-20 ENCOUNTER — Encounter: Payer: Self-pay | Admitting: Neurology

## 2017-03-24 ENCOUNTER — Telehealth: Payer: Self-pay | Admitting: *Deleted

## 2017-03-24 NOTE — Telephone Encounter (Signed)
Gave completed/signed form for [employer] program back to medical records to process.

## 2017-03-29 ENCOUNTER — Ambulatory Visit
Admission: RE | Admit: 2017-03-29 | Discharge: 2017-03-29 | Disposition: A | Discharge status: Home / Self Care | Payer: Medicare HMO | Source: Ambulatory Visit | Attending: Neurology | Admitting: Neurology

## 2017-03-29 ENCOUNTER — Encounter: Payer: Self-pay | Admitting: Neurology

## 2017-03-29 DIAGNOSIS — G20 Parkinson's disease: Secondary | ICD-10-CM | POA: Diagnosis not present

## 2017-03-30 ENCOUNTER — Encounter: Payer: Self-pay | Admitting: Neurology

## 2017-03-30 ENCOUNTER — Telehealth: Payer: Self-pay | Admitting: Neurology

## 2017-03-30 NOTE — Telephone Encounter (Signed)
I called patient. The patient is continued on Mirapex, small vessel disease is mild, likely is not the etiology of his parkinsonism. No change from 2013.   MRI brain 03/29/17:  IMPRESSION:  Abnormal MRI brain (without) demonstrating: 1. Mild periventricular and subcortical foci of chronic small vessel ischemic disease. 2. Mild diffuse atrophy. 3. No change from MRI on 10/21/11.

## 2017-04-11 ENCOUNTER — Encounter: Payer: Self-pay | Admitting: Neurology

## 2017-04-11 ENCOUNTER — Ambulatory Visit (INDEPENDENT_AMBULATORY_CARE_PROVIDER_SITE_OTHER): Payer: Medicare HMO | Admitting: Neurology

## 2017-04-11 VITALS — BP 142/86 | HR 79 | Ht 72.0 in | Wt 183.0 lb

## 2017-04-11 DIAGNOSIS — G20 Parkinson's disease: Secondary | ICD-10-CM | POA: Diagnosis not present

## 2017-04-11 NOTE — Progress Notes (Addendum)
Reason for visit: Parkinson's disease  Jeremy Davidson is an 66 y.o. male  History of present illness:  Jeremy Davidson is a 66 year old right-handed white male with a history of Parkinson's disease. The patient has recently been placed on Mirapex, he is gradually working up on the dose. He initially had some problems with nausea on the medication, but he rapidly became acclimated to the medication. He has noted it has improved the tremor on the left hand. The patient is ambulating well, he is not having falls. He comes back in today mainly to discuss the results of the MRI of the brain and get many questions answered concerning Parkinson's disease.  Past Medical History:  Diagnosis Date  . Blood in stool 2010   Colonoscopy benign repeat 2015  . Elevated cholesterol   . Hypertension   . Parkinson's disease ([place]) 03/17/2017    Past Surgical History:  Procedure Laterality Date  . HERNIA REPAIR  06/23/2010   With large ultra pro hrnia system-- Dr Donne Hazel 1971. x2    History reviewed. No pertinent family history.  Social history:  reports that he quit smoking about 18 years ago. He has never used smokeless tobacco. He reports that he drinks alcohol. He reports that he does not use drugs.   No Known Allergies  Medications:  Prior to Admission medications   Medication Sig Start Date End Date Taking? Authorizing Provider  aspirin 81 MG tablet Take 81 mg by mouth daily.   Yes [provider]  atorvastatin (LIPITOR) 40 MG tablet Take 40 mg by mouth at bedtime.   Yes [provider]  hydrochlorothiazide (HYDRODIURIL) 25 MG tablet Take 25 mg by mouth daily.   Yes [provider]  pramipexole (MIRAPEX) 0.125 MG tablet One tablet three times a day for 3 weeks, then take 2 tablets three times a day for 3 weeks, then take 3 tablets three times a day 03/17/17  Yes Kathrynn Ducking, MD  Sildenafil Citrate (VIAGRA PO) Take by mouth as needed.   Yes [provider]    ROS:  Out of a complete 14 system review of symptoms, the patient complains only of the following symptoms, and all other reviewed systems are negative.  Memory loss, dizziness Confusion Frequent waking, daytime sleepiness Fatigue  Blood pressure (!) 142/86, pulse 79, height 6' (1.829 m), weight 183 lb (83 kg).  Physical Exam  General: The patient is alert and cooperative at the time of the examination.  Skin: No significant peripheral edema is noted.   Neurologic Exam  Mental status: The patient is alert and oriented x 3 at the time of the examination. The patient has apparent normal recent and remote memory, with an apparently normal attention span and concentration ability.   Cranial nerves: Facial symmetry is present. Speech is normal, no aphasia or dysarthria is noted. Extraocular movements are full.  Gait and station: The patient has a normal gait, good arm swing seen on the right, slightly decreased on the left.   MRI brain 03/29/17:  IMPRESSION:  Abnormal MRI brain (without) demonstrating: 1. Mild periventricular and subcortical foci of chronic small vessel ischemic disease. 2. Mild diffuse atrophy. 3. No change from MRI on 10/21/11.   * MRI scan images were reviewed online. I agree with the written report.    Assessment/Plan:  1. Parkinson's disease  The patient has already had a positive response to the Mirapex. He will call me when he gets up to taking 3 tablets  3 times a day, we will convert him to a 0.5 mg tablet 3 times daily. He will follow-up for his next scheduled visit. The majority of this visit was spent answering questions regarding Parkinson's disease for the patient, and reviewing the MRI scan of the brain was done.  Jill Alexanders MD 04/11/2017 12:27 PM  [facility] [address] [place], [place] [address]  Phone [phone] Fax [phone]

## 2017-04-17 DIAGNOSIS — H6120 Impacted cerumen, unspecified ear: Secondary | ICD-10-CM | POA: Diagnosis not present

## 2017-04-17 DIAGNOSIS — H9190 Unspecified hearing loss, unspecified ear: Secondary | ICD-10-CM | POA: Diagnosis not present

## 2017-04-18 ENCOUNTER — Encounter: Payer: Self-pay | Admitting: Neurology

## 2017-04-21 ENCOUNTER — Encounter: Payer: Self-pay | Admitting: Neurology

## 2017-04-27 ENCOUNTER — Other Ambulatory Visit: Payer: Self-pay | Admitting: Neurology

## 2017-04-27 ENCOUNTER — Encounter: Payer: Self-pay | Admitting: Neurology

## 2017-04-27 MED ORDER — PRAMIPEXOLE DIHYDROCHLORIDE 0.125 MG PO TABS: ORAL_TABLET | ORAL | 1 refills | Status: DC

## 2017-04-28 ENCOUNTER — Encounter: Payer: Self-pay | Admitting: Neurology

## 2017-05-10 ENCOUNTER — Encounter: Payer: Self-pay | Admitting: Neurology

## 2017-05-11 ENCOUNTER — Encounter: Payer: Self-pay | Admitting: Neurology

## 2017-05-11 ENCOUNTER — Telehealth: Payer: Self-pay | Admitting: Neurology

## 2017-05-11 MED ORDER — PRAMIPEXOLE DIHYDROCHLORIDE 0.125 MG PO TABS: ORAL_TABLET | ORAL | 1 refills | Status: DC

## 2017-05-11 NOTE — Telephone Encounter (Signed)
The Mirapex will be refilled.

## 2017-05-11 NOTE — Telephone Encounter (Signed)
Pt called the office said he will run out of pramipexole (MIRAPEX) 0.125 MG tablet, he will be 49 tablets short (pt has sent an email regarding this also). Pt is going to [facility] today thru Sunday. He is wanting refill called to [facility] [place] [phone] Thank you

## 2017-05-18 ENCOUNTER — Encounter: Payer: Self-pay | Admitting: Neurology

## 2017-05-22 ENCOUNTER — Other Ambulatory Visit: Payer: Self-pay | Admitting: Neurology

## 2017-05-22 MED ORDER — PRAMIPEXOLE DIHYDROCHLORIDE 0.5 MG PO TABS: 0.5000 mg | ORAL_TABLET | Freq: Three times a day (TID) | ORAL | 3 refills | Status: DC

## 2017-05-30 ENCOUNTER — Encounter: Payer: Self-pay | Admitting: Neurology

## 2017-06-01 ENCOUNTER — Encounter: Payer: Self-pay | Admitting: Neurology

## 2017-06-18 ENCOUNTER — Encounter: Payer: Self-pay | Admitting: Neurology

## 2017-06-20 ENCOUNTER — Other Ambulatory Visit: Payer: Self-pay | Admitting: Neurology

## 2017-06-20 MED ORDER — PRAMIPEXOLE DIHYDROCHLORIDE 0.75 MG PO TABS: 0.7500 mg | ORAL_TABLET | Freq: Three times a day (TID) | ORAL | 1 refills | Status: DC

## 2017-06-27 ENCOUNTER — Other Ambulatory Visit: Payer: Self-pay | Admitting: Neurology

## 2017-07-03 DIAGNOSIS — R351 Nocturia: Secondary | ICD-10-CM | POA: Diagnosis not present

## 2017-07-03 DIAGNOSIS — N401 Enlarged prostate with lower urinary tract symptoms: Secondary | ICD-10-CM | POA: Diagnosis not present

## 2017-07-03 DIAGNOSIS — R3912 Poor urinary stream: Secondary | ICD-10-CM | POA: Diagnosis not present

## 2017-07-03 DIAGNOSIS — Z125 Encounter for screening for malignant neoplasm of prostate: Secondary | ICD-10-CM | POA: Diagnosis not present

## 2017-07-11 DIAGNOSIS — H35371 Puckering of macula, right eye: Secondary | ICD-10-CM | POA: Diagnosis not present

## 2017-07-14 DIAGNOSIS — Z23 Encounter for immunization: Secondary | ICD-10-CM | POA: Diagnosis not present

## 2017-07-23 DIAGNOSIS — J029 Acute pharyngitis, unspecified: Secondary | ICD-10-CM | POA: Diagnosis not present

## 2017-07-23 DIAGNOSIS — B370 Candidal stomatitis: Secondary | ICD-10-CM | POA: Diagnosis not present

## 2017-07-23 DIAGNOSIS — Z9289 Personal history of other medical treatment: Secondary | ICD-10-CM | POA: Diagnosis not present

## 2017-08-18 ENCOUNTER — Other Ambulatory Visit: Payer: Self-pay | Admitting: Neurology

## 2017-08-21 DIAGNOSIS — H6123 Impacted cerumen, bilateral: Secondary | ICD-10-CM | POA: Diagnosis not present

## 2017-08-22 ENCOUNTER — Ambulatory Visit: Payer: Medicare HMO | Admitting: Neurology

## 2017-08-22 ENCOUNTER — Encounter: Payer: Self-pay | Admitting: Neurology

## 2017-08-22 VITALS — BP 121/78 | HR 80 | Ht 72.0 in | Wt 190.5 lb

## 2017-08-22 DIAGNOSIS — G20 Parkinson's disease: Secondary | ICD-10-CM

## 2017-08-22 MED ORDER — PRAMIPEXOLE DIHYDROCHLORIDE 0.75 MG PO TABS: 0.7500 mg | ORAL_TABLET | Freq: Three times a day (TID) | ORAL | 1 refills | Status: DC

## 2017-08-22 NOTE — Progress Notes (Signed)
Reason for visit: Parkinson's disease  Jeremy Davidson is an 66 y.o. male  History of present illness:  Jeremy Davidson is a 66 year old right-handed white male with a history of Parkinson's disease.  The patient has worked up gradually on the dose of the Mirapex currently taking 0.75 mg 3 times daily.  He is tolerating the medication fairly well, he is not having any significant issues with confusion on the drug, he may occasionally see something in the corner of his eye and by the time that he turns around he does not see anything.  He sleeps well at night, he does not act out his dreams.  He may have vivid dreams at times.  The patient is quite active, he runs and he does a spin class, he has good balance, he has not had any falls.  He returns to the office today for an evaluation.  Past Medical History:  Diagnosis Date  . Blood in stool 2010   Colonoscopy benign repeat 2015  . Elevated cholesterol   . Hypertension   . Parkinson's disease ([place]) 03/17/2017    Past Surgical History:  Procedure Laterality Date  . HERNIA REPAIR  06/23/2010   With large ultra pro hrnia system-- Dr Donne Hazel 1971. x2    History reviewed. No pertinent family history.  Social history:  reports that he quit smoking about 18 years ago. he has never used smokeless tobacco. He reports that he drinks alcohol. He reports that he does not use drugs.   No Known Allergies  Medications:  Prior to Admission medications   Medication Sig Start Date End Date Taking? Authorizing Provider  aspirin 81 MG tablet Take 81 mg by mouth daily.   Yes [provider]  atorvastatin (LIPITOR) 40 MG tablet Take 40 mg by mouth at bedtime.   Yes [provider]  pramipexole (MIRAPEX) 0.75 MG tablet TAKE 1 TABLET (0.75 MG TOTAL) BY MOUTH 3 (THREE) TIMES DAILY. 08/18/17  Yes Kathrynn Ducking, MD  Sildenafil Citrate (VIAGRA PO) Take by mouth as needed.   Yes [provider]    ROS:  Out of a complete  14 system review of symptoms, the patient complains only of the following symptoms, and all other reviewed systems are negative.  Fatigue Frequency of urination Daytime sleepiness Back pain Dizziness Confusion  Blood pressure 121/78, pulse 80, height 6' (1.829 m), weight 190 lb 8 oz (86.4 kg), SpO2 98 %.  Physical Exam  General: The patient is alert and cooperative at the time of the examination.  Skin: No significant peripheral edema is noted.   Neurologic Exam  Mental status: The patient is alert and oriented x 3 at the time of the examination. The patient has apparent normal recent and remote memory, with an apparently normal attention span and concentration ability.   Cranial nerves: Facial symmetry is present. Speech is normal, no aphasia or dysarthria is noted. Extraocular movements are full. Visual fields are full.  Mild masking of the face is seen.  Motor: The patient has good strength in all 4 extremities.  Sensory examination: Soft touch sensation is symmetric on the face, arms, and legs.  Coordination: The patient has good finger-nose-finger and heel-to-shin bilaterally.  Gait and station: The patient has a normal gait.  The patient is able to arise from a seated position with the arms crossed.  Tandem gait is normal. Romberg is negative. No drift is seen.  Reflexes: Deep tendon reflexes are symmetric.   Assessment/Plan:  1.  Parkinson's disease  The patient appears to have minimum features of Parkinson's disease on medication.  The patient will continue the Mirapex at the current dose taking 0.75 mg 3 times daily.  He does report some decreased voice amplitude at times.  He is swallowing well.  He will follow-up in 6 months, sooner if needed.  He will call for any dose adjustments.  A prescription was sent in for Mirapex 0.75 mg tablets.  Jill Alexanders MD 08/22/2017 12:28 PM  [facility] [address] [place], [place]  [address]  Phone [phone] Fax [phone]

## 2017-09-08 DIAGNOSIS — B370 Candidal stomatitis: Secondary | ICD-10-CM | POA: Diagnosis not present

## 2017-09-19 DIAGNOSIS — Z0000 Encounter for general adult medical examination without abnormal findings: Secondary | ICD-10-CM | POA: Diagnosis not present

## 2017-09-19 DIAGNOSIS — G20 Parkinson's disease: Secondary | ICD-10-CM | POA: Diagnosis not present

## 2017-09-19 DIAGNOSIS — R030 Elevated blood-pressure reading, without diagnosis of hypertension: Secondary | ICD-10-CM | POA: Diagnosis not present

## 2017-09-19 DIAGNOSIS — Z79899 Other long term (current) drug therapy: Secondary | ICD-10-CM | POA: Diagnosis not present

## 2017-09-19 DIAGNOSIS — E785 Hyperlipidemia, unspecified: Secondary | ICD-10-CM | POA: Diagnosis not present

## 2017-09-19 DIAGNOSIS — Z6826 Body mass index (BMI) 26.0-26.9, adult: Secondary | ICD-10-CM | POA: Diagnosis not present

## 2017-09-19 DIAGNOSIS — Z87891 Personal history of nicotine dependence: Secondary | ICD-10-CM | POA: Diagnosis not present

## 2017-09-19 DIAGNOSIS — Z7982 Long term (current) use of aspirin: Secondary | ICD-10-CM | POA: Diagnosis not present

## 2017-10-24 DIAGNOSIS — R69 Illness, unspecified: Secondary | ICD-10-CM | POA: Diagnosis not present

## 2017-12-26 ENCOUNTER — Encounter: Payer: Self-pay | Admitting: Neurology

## 2018-01-08 ENCOUNTER — Encounter: Payer: Self-pay | Admitting: Neurology

## 2018-01-10 DIAGNOSIS — Z0001 Encounter for general adult medical examination with abnormal findings: Secondary | ICD-10-CM | POA: Diagnosis not present

## 2018-01-10 DIAGNOSIS — E7800 Pure hypercholesterolemia, unspecified: Secondary | ICD-10-CM | POA: Diagnosis not present

## 2018-01-10 DIAGNOSIS — Z23 Encounter for immunization: Secondary | ICD-10-CM | POA: Diagnosis not present

## 2018-01-10 DIAGNOSIS — Z79899 Other long term (current) drug therapy: Secondary | ICD-10-CM | POA: Diagnosis not present

## 2018-01-10 DIAGNOSIS — G20 Parkinson's disease: Secondary | ICD-10-CM | POA: Diagnosis not present

## 2018-01-19 ENCOUNTER — Encounter: Payer: Self-pay | Admitting: Neurology

## 2018-02-08 ENCOUNTER — Encounter: Payer: Self-pay | Admitting: Neurology

## 2018-02-19 ENCOUNTER — Encounter: Payer: Self-pay | Admitting: Neurology

## 2018-02-19 ENCOUNTER — Ambulatory Visit: Payer: Medicare HMO | Admitting: Neurology

## 2018-02-19 VITALS — BP 154/91 | HR 73 | Ht 72.0 in | Wt 190.5 lb

## 2018-02-19 DIAGNOSIS — E538 Deficiency of other specified B group vitamins: Secondary | ICD-10-CM | POA: Diagnosis not present

## 2018-02-19 DIAGNOSIS — R413 Other amnesia: Secondary | ICD-10-CM

## 2018-02-19 DIAGNOSIS — G20 Parkinson's disease: Secondary | ICD-10-CM | POA: Diagnosis not present

## 2018-02-19 MED ORDER — PRAMIPEXOLE DIHYDROCHLORIDE 0.75 MG PO TABS: 0.7500 mg | ORAL_TABLET | Freq: Three times a day (TID) | ORAL | 1 refills | Status: DC

## 2018-02-19 NOTE — Progress Notes (Signed)
Reason for visit: Parkinson's disease  Jeremy Davidson is an 67 y.o. male  History of present illness:  Jeremy Davidson is a 67 year old right-handed white male with a history of Parkinson's disease.  The patient has remained quite active, he runs and works out on a regular basis.  The patient has reported some mild tremor at times affecting the left hand.  The patient has not had any falls.  He denies difficulty swallowing.  He has urinary frequency at night, he gets up 3-4 times.  He has noted some problems with word finding and some mild memory problems that may come and go.  The patient reports no focal symptoms of numbness or weakness.  He is on Mirapex taking 0.75 mg 3 times daily.  He has not had any decline in his physical capabilities since last seen.  Past Medical History:  Diagnosis Date  . Blood in stool 2010   Colonoscopy benign repeat 2015  . Elevated cholesterol   . Hypertension   . Parkinson's disease ([place]) 03/17/2017    Past Surgical History:  Procedure Laterality Date  . HERNIA REPAIR  06/23/2010   With large ultra pro hrnia system-- Dr Donne Hazel 1971. x2    History reviewed. No pertinent family history.  Social history:  reports that he quit smoking about 19 years ago. He has never used smokeless tobacco. He reports that he drinks alcohol. He reports that he does not use drugs.   No Known Allergies  Medications:  Prior to Admission medications   Medication Sig Start Date End Date Taking? Authorizing Provider  atorvastatin (LIPITOR) 40 MG tablet Take 40 mg by mouth at bedtime.   Yes [provider]  pramipexole (MIRAPEX) 0.75 MG tablet Take 1 tablet (0.75 mg total) by mouth 3 (three) times daily. 02/19/18  Yes Kathrynn Ducking, MD  Sildenafil Citrate (VIAGRA PO) Take by mouth as needed.   Yes [provider]    ROS:  Out of a complete 14 system review of symptoms, the patient complains only of the following symptoms, and all other reviewed  systems are negative.  Fatigue Frequency of urination Back pain Dizziness, tremors Confusion  Blood pressure (!) 154/91, pulse 73, height 6' (1.829 m), weight 190 lb 8 oz (86.4 kg).   Blood pressure, right arm, sitting is 142/80.  Blood pressure, right arm, standing is 161 systolic.  Physical Exam  General: The patient is alert and cooperative at the time of the examination.  Skin: No significant peripheral edema is noted.   Neurologic Exam  Mental status: The patient is alert and oriented x 3 at the time of the examination. The patient has apparent normal recent and remote memory, with an apparently normal attention span and concentration ability.  Mini-Mental status examination done today shows a total score 27/30   Cranial nerves: Facial symmetry is present. Speech is normal, no aphasia or dysarthria is noted. Extraocular movements are full. Visual fields are full.  Motor: The patient has good strength in all 4 extremities.  Sensory examination: Soft touch sensation is symmetric on the face, arms, and legs.  Coordination: The patient has good finger-nose-finger and heel-to-shin bilaterally.  Gait and station: The patient has a normal gait.  There appears to be some very slight decrease in arm swing on the left.  Tandem gait is normal. Romberg is negative. No drift is seen.  Reflexes: Deep tendon reflexes are symmetric.   Assessment/Plan:  1.  Parkinson's disease  2.  Reports  of memory disturbance  We will follow the memory issues over time.  The patient will remain on his current dose of Mirapex, a prescription was sent in.  He will follow-up in 6 months.  The tremor issue appears to be quite minimal at this time.  Jeremy Alexanders MD 02/19/2018 12:33 PM  [facility] [address] [place], [place] [address]  Phone [phone] Fax [phone]

## 2018-02-20 ENCOUNTER — Telehealth: Payer: Self-pay | Admitting: Neurology

## 2018-02-20 LAB — SEDIMENTATION RATE: SED RATE: 2 mm/h (ref 0–30)

## 2018-02-20 LAB — RPR: RPR: NONREACTIVE

## 2018-02-20 LAB — VITAMIN B12: VITAMIN B 12: 420 pg/mL (ref 232–1245)

## 2018-02-20 NOTE — Telephone Encounter (Signed)
Called pt. Advised per CW,MD note, labs unremarkable. He released the results to [facility] and that is why he received a message. He verbalized understanding. Nothing further needed at this time.

## 2018-02-20 NOTE — Telephone Encounter (Signed)
Pt called stating he had been notified that he had 3 mychart messages, one being about B12 but once the pt clicked to read it they all disappeared pt just needing to know if there was anything he needed to know

## 2018-02-21 ENCOUNTER — Ambulatory Visit: Payer: Medicare HMO | Admitting: Neurology

## 2018-03-08 DIAGNOSIS — L570 Actinic keratosis: Secondary | ICD-10-CM | POA: Diagnosis not present

## 2018-03-08 DIAGNOSIS — Z85828 Personal history of other malignant neoplasm of skin: Secondary | ICD-10-CM | POA: Diagnosis not present

## 2018-03-08 DIAGNOSIS — L821 Other seborrheic keratosis: Secondary | ICD-10-CM | POA: Diagnosis not present

## 2018-03-08 DIAGNOSIS — C44519 Basal cell carcinoma of skin of other part of trunk: Secondary | ICD-10-CM | POA: Diagnosis not present

## 2018-03-08 DIAGNOSIS — L814 Other melanin hyperpigmentation: Secondary | ICD-10-CM | POA: Diagnosis not present

## 2018-03-08 DIAGNOSIS — D225 Melanocytic nevi of trunk: Secondary | ICD-10-CM | POA: Diagnosis not present

## 2018-03-08 DIAGNOSIS — D045 Carcinoma in situ of skin of trunk: Secondary | ICD-10-CM | POA: Diagnosis not present

## 2018-04-09 ENCOUNTER — Encounter: Payer: Self-pay | Admitting: Neurology

## 2018-04-14 ENCOUNTER — Encounter: Payer: Self-pay | Admitting: Neurology

## 2018-04-17 ENCOUNTER — Encounter: Payer: Self-pay | Admitting: Neurology

## 2018-04-27 ENCOUNTER — Encounter: Payer: Self-pay | Admitting: Neurology

## 2018-04-30 ENCOUNTER — Telehealth: Payer: Self-pay | Admitting: Neurology

## 2018-04-30 NOTE — Telephone Encounter (Signed)
Please call patient regarding his question about Droxidopa. Please advise patient that this medication is indicated for true blood pressure drops in the context of a neurodegenerative disorder and while Parkinson's disease would qualify him for the medication, he would have to have evidence of true blood pressure drop when changing positions. It may not be feasible to try this medication for just symptoms of lightheadedness. For that, he is encouraged to stay well-hydrated and change positions slowly, monitor his blood pressure if possible.

## 2018-04-30 NOTE — Telephone Encounter (Signed)
Sent pt mychart message

## 2018-05-10 DIAGNOSIS — R69 Illness, unspecified: Secondary | ICD-10-CM | POA: Diagnosis not present

## 2018-06-22 DIAGNOSIS — M545 Low back pain: Secondary | ICD-10-CM | POA: Diagnosis not present

## 2018-06-22 DIAGNOSIS — Z23 Encounter for immunization: Secondary | ICD-10-CM | POA: Diagnosis not present

## 2018-06-28 DIAGNOSIS — N400 Enlarged prostate without lower urinary tract symptoms: Secondary | ICD-10-CM | POA: Diagnosis not present

## 2018-07-09 DIAGNOSIS — Z125 Encounter for screening for malignant neoplasm of prostate: Secondary | ICD-10-CM | POA: Diagnosis not present

## 2018-07-09 DIAGNOSIS — R351 Nocturia: Secondary | ICD-10-CM | POA: Diagnosis not present

## 2018-08-09 DIAGNOSIS — H5203 Hypermetropia, bilateral: Secondary | ICD-10-CM | POA: Diagnosis not present

## 2018-08-09 DIAGNOSIS — H35371 Puckering of macula, right eye: Secondary | ICD-10-CM | POA: Diagnosis not present

## 2018-08-09 DIAGNOSIS — H2513 Age-related nuclear cataract, bilateral: Secondary | ICD-10-CM | POA: Diagnosis not present

## 2018-08-09 DIAGNOSIS — H524 Presbyopia: Secondary | ICD-10-CM | POA: Diagnosis not present

## 2018-08-09 DIAGNOSIS — H52223 Regular astigmatism, bilateral: Secondary | ICD-10-CM | POA: Diagnosis not present

## 2018-08-13 ENCOUNTER — Other Ambulatory Visit: Payer: Self-pay | Admitting: Neurology

## 2018-08-16 ENCOUNTER — Telehealth: Payer: Self-pay | Admitting: Neurology

## 2018-08-16 NOTE — Telephone Encounter (Signed)
LMOM that Dr. Jannifer Franklin just had a cancellation for 3pm today. If he could be here by 2:45pm to check in, Dr. Jannifer Franklin could see him today. If not, will add to the cancellation list and call him if another appt.  sooner than 08/27/18 comes open/fim

## 2018-08-16 NOTE — Telephone Encounter (Signed)
Patient called in stating that he saw the my chart message from [facility] about coming in at noon today 08/16/18 but he just saw it. I informed him that I don't see that message at all.. Right now he is scheduled for 08/27/18 to see Dr. Jannifer Franklin but didn't know if in the next week if there would be an cancellation to be seen sooner.

## 2018-08-23 ENCOUNTER — Encounter: Payer: Self-pay | Admitting: *Deleted

## 2018-08-27 ENCOUNTER — Ambulatory Visit: Payer: Medicare HMO | Admitting: Neurology

## 2018-08-27 ENCOUNTER — Encounter: Payer: Self-pay | Admitting: Neurology

## 2018-08-27 ENCOUNTER — Other Ambulatory Visit: Payer: Self-pay

## 2018-08-27 ENCOUNTER — Telehealth: Payer: Self-pay | Admitting: Neurology

## 2018-08-27 VITALS — BP 131/83 | HR 81 | Resp 18 | Ht 72.0 in | Wt 195.5 lb

## 2018-08-27 DIAGNOSIS — G20 Parkinson's disease: Secondary | ICD-10-CM | POA: Diagnosis not present

## 2018-08-27 MED ORDER — PRAMIPEXOLE DIHYDROCHLORIDE 1 MG PO TABS: 1.0000 mg | ORAL_TABLET | Freq: Three times a day (TID) | ORAL | 3 refills | Status: DC

## 2018-08-27 NOTE — Telephone Encounter (Signed)
error 

## 2018-08-27 NOTE — Patient Instructions (Signed)
We will increase the mirapex to 1 mg three times a day.  Week 1-2:  0.75 mg/ 0.75 mg/ 1 mg  Week 3-4:  1 mg/ 0.75 mg/ 1 mg  Then start 1 mg three times a day.

## 2018-08-27 NOTE — Progress Notes (Signed)
Reason for visit: Parkinson's disease  Jeremy Davidson is an 67 y.o. male  History of present illness:  Jeremy Davidson is a 67 year old right-handed white male with a history of Parkinson's disease.  The patient has tremors that affect mainly the left hand, he has some hypophonia that may come and go.  He is remaining quite active, he has excellent mobility.  He is on minimal medications to include Mirapex 0.75 mg 3 times daily.  The patient does have a mild memory issue, this has not worsened much over time.  The patient is having occasional events where he may have a vivid dream and when he awakens, he may have some visual hallucinations until he fully clears from sleep.  The patient denies any problems with choking with swallowing.  He does have some problems with directions with driving at times otherwise he is able to function fairly well cognitively.  He returns to this office for an evaluation.  The patient has had some low back pain off and on over the last several weeks, fortunately, this has improved within the last 7 to 10 days, he is not having any pain currently.  When he did have back pain, it was in the center of the back without radiation down the legs.  Past Medical History:  Diagnosis Date  . Blood in stool 2010   Colonoscopy benign repeat 2015  . Elevated cholesterol   . Hypertension   . Parkinson's disease ([place]) 03/17/2017    Past Surgical History:  Procedure Laterality Date  . HERNIA REPAIR  06/23/2010   With large ultra pro hrnia system-- Dr Donne Hazel 1971. x2    History reviewed. No pertinent family history.  Social history:  reports that he quit smoking about 19 years ago. He has never used smokeless tobacco. He reports that he drinks alcohol. He reports that he does not use drugs.   No Known Allergies  Medications:  Prior to Admission medications   Medication Sig Start Date End Date Taking? Authorizing Provider  atorvastatin (LIPITOR) 40 MG tablet Take 40  mg by mouth at bedtime.   Yes [provider]  pramipexole (MIRAPEX) 0.75 MG tablet TAKE 1 TABLET BY MOUTH 3 TIMES A DAY 08/13/18  Yes Kathrynn Ducking, MD  tamsulosin (FLOMAX) 0.4 MG CAPS capsule Take 0.4 mg by mouth.   Yes [provider]  Sildenafil Citrate (VIAGRA PO) Take by mouth as needed.    [provider]    ROS:  Out of a complete 14 system review of symptoms, the patient complains only of the following symptoms, and all other reviewed systems are negative.  Tremors Memory disturbance  Blood pressure 131/83, pulse 81, resp. rate 18, height 6' (1.829 m), weight 195 lb 8 oz (88.7 kg).  Physical Exam  General: The patient is alert and cooperative at the time of the examination.  Skin: No significant peripheral edema is noted.   Neurologic Exam  Mental status: The patient is alert and oriented x 3 at the time of the examination. The Mini-Mental status examination done today shows a total score 27/30.  The patient is able to name 13 four-legged animals in 1 minute..   Cranial nerves: Facial symmetry is present. Speech is normal, no aphasia or dysarthria is noted. Extraocular movements are full. Visual fields are full.  Motor: The patient has good strength in all 4 extremities.  Sensory examination: Soft touch sensation is symmetric on the face, arms, and legs.  Coordination: The  patient has good finger-nose-finger and heel-to-shin bilaterally.  Gait and station: The patient has a normal gait.  The patient is able to rise from a seated position with the arms crossed.  Tandem gait is normal. Romberg is negative. No drift is seen.  Reflexes: Deep tendon reflexes are symmetric.   Assessment/Plan:  1.  Parkinson's disease  2.  Memory disturbance  The patient appears to be doing fairly well with his mobility.  He may get into yoga for his back pain and for his mobility.  The patient will go up on the Mirapex, he will go to 1 mg 3 times daily.   He will follow-up in 6 months.  The patient will contact me if any new issues arise.  He does not wish to go on medications for memory at this time.  Jill Alexanders MD 08/27/2018 10:00 AM  [facility] [address] [place],  [address]  Phone [phone] Fax [phone]

## 2018-10-22 DIAGNOSIS — Z0100 Encounter for examination of eyes and vision without abnormal findings: Secondary | ICD-10-CM | POA: Diagnosis not present

## 2018-11-15 DIAGNOSIS — R69 Illness, unspecified: Secondary | ICD-10-CM | POA: Diagnosis not present

## 2018-12-11 DIAGNOSIS — L570 Actinic keratosis: Secondary | ICD-10-CM | POA: Diagnosis not present

## 2018-12-11 DIAGNOSIS — D485 Neoplasm of uncertain behavior of skin: Secondary | ICD-10-CM | POA: Diagnosis not present

## 2018-12-11 DIAGNOSIS — R69 Illness, unspecified: Secondary | ICD-10-CM | POA: Diagnosis not present

## 2018-12-11 DIAGNOSIS — Z85828 Personal history of other malignant neoplasm of skin: Secondary | ICD-10-CM | POA: Diagnosis not present

## 2019-03-19 ENCOUNTER — Telehealth: Payer: Self-pay | Admitting: Neurology

## 2019-03-19 DIAGNOSIS — E7800 Pure hypercholesterolemia, unspecified: Secondary | ICD-10-CM | POA: Diagnosis not present

## 2019-03-19 DIAGNOSIS — Z1159 Encounter for screening for other viral diseases: Secondary | ICD-10-CM | POA: Diagnosis not present

## 2019-03-19 DIAGNOSIS — Z79899 Other long term (current) drug therapy: Secondary | ICD-10-CM | POA: Diagnosis not present

## 2019-03-19 DIAGNOSIS — G252 Other specified forms of tremor: Secondary | ICD-10-CM | POA: Diagnosis not present

## 2019-03-19 NOTE — Telephone Encounter (Signed)
Due to current COVID 19 pandemic, our office is severely reducing in office visits until further notice, in order to minimize the risk to our patients and healthcare providers.   Called patient and confirmed a virtual visit for his 6/8 appointment. Patient verbalized understanding of the [email or web address] process and I have sent him an e-mail with link and directions as well as my name and office number/hours for reference. Patient understands that he will receive a call from RN to update chart.  Pt understands that although there may be some limitations with this type of visit, we will take all precautions to reduce any security or privacy concerns.  Pt understands that this will be treated like an in office visit and we will file with pt's insurance, and there may be a patient responsible charge related to this service.

## 2019-03-21 DIAGNOSIS — G20 Parkinson's disease: Secondary | ICD-10-CM | POA: Diagnosis not present

## 2019-03-21 DIAGNOSIS — R42 Dizziness and giddiness: Secondary | ICD-10-CM | POA: Diagnosis not present

## 2019-03-21 DIAGNOSIS — Z0001 Encounter for general adult medical examination with abnormal findings: Secondary | ICD-10-CM | POA: Diagnosis not present

## 2019-03-25 ENCOUNTER — Encounter: Payer: Self-pay | Admitting: Neurology

## 2019-03-25 ENCOUNTER — Other Ambulatory Visit: Payer: Self-pay

## 2019-03-25 ENCOUNTER — Ambulatory Visit (INDEPENDENT_AMBULATORY_CARE_PROVIDER_SITE_OTHER): Payer: Medicare HMO | Admitting: Neurology

## 2019-03-25 DIAGNOSIS — G20 Parkinson's disease: Secondary | ICD-10-CM | POA: Diagnosis not present

## 2019-03-25 MED ORDER — PRAMIPEXOLE DIHYDROCHLORIDE 1.5 MG PO TABS: 1.5000 mg | ORAL_TABLET | Freq: Three times a day (TID) | ORAL | 3 refills | Status: DC

## 2019-03-25 NOTE — Progress Notes (Signed)
     Virtual Visit via Video Note  I connected with Jeremy Davidson on 03/25/19 at 10:30 AM EDT by a video enabled telemedicine application and verified that I am speaking with the correct person using two identifiers.  Location: Patient: The patient is at home. Provider: Physician in office.   I discussed the limitations of evaluation and management by telemedicine and the availability of in person appointments. The patient expressed understanding and agreed to proceed.  History of Present Illness: Jeremy Davidson is a 68 year old right-handed white male with a history of Parkinson's disease.  The patient has been quite active, he exercises daily, he has noted that his ability to run and walk has slowed some, he will occasionally stumble but he is not having falls.  The patient continues to have some tremor involving the left arm.  He notes that his balance is off slightly, he will have low amplitude speech at times.  He does shuffle some when he first tries to walk.  He is on Mirapex taking 1 mg 3 times daily, he tolerates medication well.  Occasionally, he may have some sort of a visual phenomena in the periphery of the vision, when he turns to look, nothing is there.  He is sleeping fairly well at night, he takes a nap in late afternoon which seems to help.   Observations/Objective: The virtual visit reveals that the patient is alert and cooperative.  He is speaking relatively normally, no aphasia or dysarthria or hypophonia is noted.  The patient has good finger-nose-finger and heel shin bilaterally.  He has good facial symmetry, he is able to protrude the tongue midline with good lateral movement of the tongue.  He is able to arise from a seated position easily, he can walk without assistance.  He does have arm swing bilaterally.  He is able to perform tandem gait with some difficulty, slight imbalance is noted.  Romberg is negative.  No drift is seen.  Assessment and Plan: 1.   Parkinson's disease  The patient is doing quite well at this point.  He is starting to have more bradykinesia, for this reason we will go up to the 1.5 mg tablets of the Mirapex.  A prescription will be sent in.  He will phase in a 1.5 mg tablet for a 1 mg tablet every 2 weeks until he gets to 1.5 mg 3 times daily.  Follow Up Instructions: 60-month follow-up with me.   I discussed the assessment and treatment plan with the patient. The patient was provided an opportunity to ask questions and all were answered. The patient agreed with the plan and demonstrated an understanding of the instructions.   The patient was advised to call back or seek an in-person evaluation if the symptoms worsen or if the condition fails to improve as anticipated.  I provided 25 minutes of non-face-to-face time during this encounter.   Kathrynn Ducking, MD

## 2019-04-03 DIAGNOSIS — Z85828 Personal history of other malignant neoplasm of skin: Secondary | ICD-10-CM | POA: Diagnosis not present

## 2019-04-03 DIAGNOSIS — L821 Other seborrheic keratosis: Secondary | ICD-10-CM | POA: Diagnosis not present

## 2019-04-03 DIAGNOSIS — L570 Actinic keratosis: Secondary | ICD-10-CM | POA: Diagnosis not present

## 2019-04-03 DIAGNOSIS — L814 Other melanin hyperpigmentation: Secondary | ICD-10-CM | POA: Diagnosis not present

## 2019-04-07 DIAGNOSIS — Z20828 Contact with and (suspected) exposure to other viral communicable diseases: Secondary | ICD-10-CM | POA: Diagnosis not present

## 2019-04-23 DIAGNOSIS — R69 Illness, unspecified: Secondary | ICD-10-CM | POA: Diagnosis not present

## 2019-05-06 DIAGNOSIS — Z20828 Contact with and (suspected) exposure to other viral communicable diseases: Secondary | ICD-10-CM | POA: Diagnosis not present

## 2019-05-13 DIAGNOSIS — Z20828 Contact with and (suspected) exposure to other viral communicable diseases: Secondary | ICD-10-CM | POA: Diagnosis not present

## 2019-05-15 DIAGNOSIS — E785 Hyperlipidemia, unspecified: Secondary | ICD-10-CM | POA: Diagnosis not present

## 2019-05-15 DIAGNOSIS — Z7982 Long term (current) use of aspirin: Secondary | ICD-10-CM | POA: Diagnosis not present

## 2019-05-15 DIAGNOSIS — Z85828 Personal history of other malignant neoplasm of skin: Secondary | ICD-10-CM | POA: Diagnosis not present

## 2019-05-15 DIAGNOSIS — Z809 Family history of malignant neoplasm, unspecified: Secondary | ICD-10-CM | POA: Diagnosis not present

## 2019-05-15 DIAGNOSIS — G3184 Mild cognitive impairment, so stated: Secondary | ICD-10-CM | POA: Diagnosis not present

## 2019-05-15 DIAGNOSIS — G20 Parkinson's disease: Secondary | ICD-10-CM | POA: Diagnosis not present

## 2019-05-15 DIAGNOSIS — N400 Enlarged prostate without lower urinary tract symptoms: Secondary | ICD-10-CM | POA: Diagnosis not present

## 2019-06-18 ENCOUNTER — Other Ambulatory Visit: Payer: Self-pay

## 2019-06-18 DIAGNOSIS — Z20822 Contact with and (suspected) exposure to covid-19: Secondary | ICD-10-CM

## 2019-06-18 DIAGNOSIS — R6889 Other general symptoms and signs: Secondary | ICD-10-CM | POA: Diagnosis not present

## 2019-06-19 LAB — NOVEL CORONAVIRUS, NAA: SARS-CoV-2, NAA: NOT DETECTED

## 2019-06-25 DIAGNOSIS — R69 Illness, unspecified: Secondary | ICD-10-CM | POA: Diagnosis not present

## 2019-07-08 DIAGNOSIS — N400 Enlarged prostate without lower urinary tract symptoms: Secondary | ICD-10-CM | POA: Diagnosis not present

## 2019-07-12 DIAGNOSIS — N529 Male erectile dysfunction, unspecified: Secondary | ICD-10-CM | POA: Diagnosis not present

## 2019-07-12 DIAGNOSIS — R351 Nocturia: Secondary | ICD-10-CM | POA: Diagnosis not present

## 2019-07-12 DIAGNOSIS — Z125 Encounter for screening for malignant neoplasm of prostate: Secondary | ICD-10-CM | POA: Diagnosis not present

## 2019-08-12 DIAGNOSIS — H2513 Age-related nuclear cataract, bilateral: Secondary | ICD-10-CM | POA: Diagnosis not present

## 2019-08-13 DIAGNOSIS — R69 Illness, unspecified: Secondary | ICD-10-CM | POA: Diagnosis not present

## 2019-08-19 DIAGNOSIS — Z20828 Contact with and (suspected) exposure to other viral communicable diseases: Secondary | ICD-10-CM | POA: Diagnosis not present

## 2019-08-22 DIAGNOSIS — R69 Illness, unspecified: Secondary | ICD-10-CM | POA: Diagnosis not present

## 2019-08-30 DIAGNOSIS — Z20828 Contact with and (suspected) exposure to other viral communicable diseases: Secondary | ICD-10-CM | POA: Diagnosis not present

## 2019-09-10 ENCOUNTER — Encounter: Payer: Self-pay | Admitting: Neurology

## 2019-09-24 ENCOUNTER — Other Ambulatory Visit: Payer: Self-pay

## 2019-09-24 ENCOUNTER — Encounter: Payer: Self-pay | Admitting: Neurology

## 2019-09-24 ENCOUNTER — Ambulatory Visit: Payer: Medicare HMO | Admitting: Neurology

## 2019-09-24 VITALS — BP 142/80 | HR 68 | Temp 97.8°F | Ht 72.0 in | Wt 201.3 lb

## 2019-09-24 DIAGNOSIS — G20 Parkinson's disease: Secondary | ICD-10-CM

## 2019-09-24 DIAGNOSIS — G20A1 Parkinson's disease without dyskinesia, without mention of fluctuations: Secondary | ICD-10-CM

## 2019-09-24 NOTE — Progress Notes (Addendum)
Reason for visit: Parkinson's disease  Jeremy Davidson is an 68 y.o. male  History of present illness:  Jeremy. Davidson is a 68 year old right-handed white male with a history of Parkinson's disease.  The patient has progressed very slowly over the last 2 years, he has a resting tremor in the left arm.  He is remaining quite active, he is not having issues with shuffling or any balance problems, he has not had any falls.  He was increased on the Mirapex to the 1.5 mg tablets 3 times daily in June 2020.  Since that time, he has had occasional hallucinations that are visual in nature, he usually will see the child inside the house.  The hallucinations are not upsetting to him and are not daily in nature.  The patient has good and bad days with his energy level.  Most nights he sleeps fairly well.  He is involved with [employer] and has been walking on a regular basis.  The patient denies any issues with choking with swallowing.  He does report some blurring of vision with reading, he has gotten prism reading glasses for this purpose, he believes that this is helpful.  Past Medical History:  Diagnosis Date  . Blood in stool 2010   Colonoscopy benign repeat 2015  . Elevated cholesterol   . Hypertension   . Parkinson's disease ([place]) 03/17/2017    Past Surgical History:  Procedure Laterality Date  . HERNIA REPAIR  06/23/2010   With large ultra pro hrnia system-- Dr Donne Hazel 1971. x2    History reviewed. No pertinent family history.  Social history:  reports that he quit smoking about 20 years ago. He has never used smokeless tobacco. He reports current alcohol use. He reports that he does not use drugs.   No Known Allergies  Medications:  Prior to Admission medications   Medication Sig Start Date End Date Taking? Authorizing Provider  atorvastatin (LIPITOR) 40 MG tablet Take 40 mg by mouth at bedtime.    [provider]  pramipexole (MIRAPEX) 1.5 MG tablet Take 1  tablet (1.5 mg total) by mouth 3 (three) times daily. 03/25/19   Kathrynn Ducking, MD  Sildenafil Citrate (VIAGRA PO) Take by mouth as needed.    [provider]  tamsulosin (FLOMAX) 0.4 MG CAPS capsule Take 0.4 mg by mouth.    [provider]    ROS:  Out of a complete 14 system review of symptoms, the patient complains only of the following symptoms, and all other reviewed systems are negative.  Tremor Hallucinations  Blood pressure (!) 142/80, pulse 68, temperature 97.8 F (36.6 C), temperature source Temporal, height 6' (1.829 m), weight 201 lb 5 oz (91.3 kg).  Physical Exam  General: The patient is alert and cooperative at the time of the examination.  Skin: No significant peripheral edema is noted.   Neurologic Exam  Mental status: The patient is alert and oriented x 3 at the time of the examination. The patient has apparent normal recent and remote memory, with an apparently normal attention span and concentration ability.  The Mini-Mental status examination done today shows a total score 30/30.   Cranial nerves: Facial symmetry is present. Speech is normal, no aphasia or dysarthria is noted. Extraocular movements are full. Visual fields are full.  Motor: The patient has good strength in all 4 extremities.  Sensory examination: Soft touch sensation is symmetric on the face, arms, and legs.  Coordination: The patient has good  finger-nose-finger and heel-to-shin bilaterally.  A mild resting tremor seen intermittently with the left upper extremity.  Gait and station: The patient has a normal gait, there may be a slight decrease in arm swing on the left. Tandem gait is normal. Romberg is negative. No drift is seen.  Reflexes: Deep tendon reflexes are symmetric.   Assessment/Plan:  1.  Parkinson's disease  The patient is doing fairly well at this time.  His mobility is excellent, we will continue on the Mirapex taking 1.5 mg tablets 3 times daily.  He is  having some problems with hallucinations we will need to follow this over time, he will call if they are worsening.  The patient is doing a good job staying active.  He will be seen by Dr. Carles Collet for second opinion in January 2021.  Jill Alexanders MD 09/24/2019 10:27 AM  [facility] [address] [place], [place] [address]  Phone [phone] Fax [phone]

## 2019-10-21 NOTE — Progress Notes (Deleted)
Jeremy Davidson was seen today in the movement disorders clinic for neurologic consultation at the request of Koirala, Dibas, MD.  The consultation is for a second opinion on PD.  Outside records that were made available to me were reviewed, including those of Dr. Jannifer Franklin.  Record review: Patient began to see Dr. Jannifer Franklin in June, 2018 with complaints of left arm and left leg tremor that began 1 month prior.  He was started on pramipexole.  The dose has changed somewhat over the years, and he is currently on 1.5 mg 3 times per day.  The medication was last increased in June, 2020.  Patient was last seen by Dr. Jannifer Franklin in December, 2020.  At that point in time, the patient was reporting occasional hallucinations, including seeing a child inside the home.  The first symptom(s) the patient noticed was {parkinsons general sx:18033} and this was {NUMBERS;0-15 BY 1:408015} {days/wks/mos/yrs:310907}.    Specific Symptoms:  Tremor: {yes no:314532} Family hx of similar:  {yes no:314532} Voice: *** Sleep: ***  Vivid Dreams:  {yes no:314532}  Acting out dreams:  {yes no:314532} Wet Pillows: {yes no:314532} Postural symptoms:  {yes no:314532}  Falls?  {yes no:314532} Bradykinesia symptoms: {parkinson brady:18041} Loss of smell:  {yes no:314532} Loss of taste:  {yes no:314532} Urinary Incontinence:  {yes no:314532} Difficulty Swallowing:  {yes no:314532} Handwriting, micrographia: {yes no:314532} Trouble with ADL's:  {yes no:314532}  Trouble buttoning clothing: {yes no:314532} Depression:  {yes no:314532} Memory changes:  {yes no:314532} Hallucinations:  {yes no:314532}  visual distortions: {yes no:314532} N/V:  {yes no:314532} Lightheaded:  {yes no:314532}  Syncope: {yes no:314532} Diplopia:  {yes no:314532} Dyskinesia:  {yes no:314532} Prior exposure to reglan/antipsychotics: {yes no:314532}   MRI of the brain was ordered and completed in June, 2018.  It was reported to show mild T2  hyperintensities.  This image was not available.  I was able to review the January, 2013 MRI of the brain and it also showed mild T2 hyperintensities.  According to the report of the 2018 MRI, it had not changed from 2013.  PREVIOUS MEDICATIONS: {Parkinson's RX:18200}  ALLERGIES:  No Known Allergies  CURRENT MEDICATIONS:  Current Outpatient Medications  Medication Instructions  . atorvastatin (LIPITOR) 40 mg, Daily at bedtime  . pramipexole (MIRAPEX) 1.5 mg, Oral, 3 times daily  . Sildenafil Citrate (VIAGRA PO) As needed  . tamsulosin (FLOMAX) 0.4 mg, Oral    VITALS:  There were no vitals filed for this visit.  GEN:  The patient appears stated age and is in NAD. HEENT:  Normocephalic, atraumatic.  The mucous membranes are moist. The superficial temporal arteries are without ropiness or tenderness. CV:  RRR Lungs:  CTAB Neck/HEME:  There are no carotid bruits bilaterally.  Neurological examination:  Orientation: The patient is alert and oriented x3.  Cranial nerves: There is good facial symmetry. Extraocular muscles are intact. The visual fields are full to confrontational testing. The speech is fluent and clear. Soft palate rises symmetrically and there is no tongue deviation. Hearing is intact to conversational tone. Sensation: Sensation is intact to light and pinprick throughout (facial, trunk, extremities). Vibration is intact at the bilateral big toe. There is no extinction with double simultaneous stimulation. There is no sensory dermatomal level identified. Motor: Strength is 5/5 in the bilateral upper and lower extremities.   Shoulder shrug is equal and symmetric.  There is no pronator drift. Deep tendon reflexes: Deep tendon reflexes are 2/4 at the bilateral biceps, triceps, brachioradialis, patella and achilles. Plantar  responses are downgoing bilaterally.  Movement examination: Tone: There is ***tone in the bilateral upper extremities.  The tone in the lower extremities is  ***.  Abnormal movements: *** Coordination:  There is *** decremation with RAM's, *** Gait and Station: The patient has *** difficulty arising out of a deep-seated chair without the use of the hands. The patient's stride length is ***.  The patient has a *** pull test.      ASSESSMENT/PLAN:  ***  Total time spent on today's visit was ***greater than 60 minutes, including both face-to-face time and nonface-to-face time.  Time included that spent on review of records (prior notes available to me/labs/imaging if pertinent), discussing treatment and goals, answering patient's questions and coordinating care.  Cc:  Lujean Amel, MD

## 2019-10-22 DIAGNOSIS — R05 Cough: Secondary | ICD-10-CM | POA: Diagnosis not present

## 2019-10-24 ENCOUNTER — Ambulatory Visit: Payer: Medicare HMO | Admitting: Neurology

## 2019-10-25 DIAGNOSIS — R05 Cough: Secondary | ICD-10-CM | POA: Diagnosis not present

## 2019-10-28 DIAGNOSIS — R69 Illness, unspecified: Secondary | ICD-10-CM | POA: Diagnosis not present

## 2019-11-06 ENCOUNTER — Ambulatory Visit: Payer: Medicare Other | Attending: Internal Medicine

## 2019-11-06 DIAGNOSIS — Z23 Encounter for immunization: Secondary | ICD-10-CM | POA: Insufficient documentation

## 2019-11-06 NOTE — Progress Notes (Signed)
   Covid-19 Vaccination Clinic  Name:  Jeremy Davidson    MRN: [id number] DOB: May 23, 1951  11/06/2019  Jeremy Davidson was observed post Covid-19 immunization for 15 minutes without incidence. He was provided with Vaccine Information Sheet and instruction to access the V-Safe system.   Jeremy Davidson was instructed to call 911 with any severe reactions post vaccine: Marland Kitchen Difficulty breathing  . Swelling of your face and throat  . A fast heartbeat  . A bad rash all over your body  . Dizziness and weakness    Immunizations Administered    Name Date Dose VIS Date Route   Pfizer COVID-19 Vaccine 11/06/2019  1:18 PM 0.3 mL 09/27/2019 Intramuscular   Manufacturer: [place]   Lot: [id number]   [place]: [id number]

## 2019-11-22 NOTE — Progress Notes (Signed)
Jeremy Davidson was seen today in the movement disorders clinic for neurologic consultation at the request of Koirala, Dibas, MD.  The consultation is for a second opinion on PD.  Outside records that were made available to me were reviewed, including those of Dr. Jannifer Franklin.  Record review: Patient began to see Dr. Jannifer Franklin in June, 2018 with complaints of left arm and left leg tremor that began 1 month prior.  He was started on pramipexole.  The dose has changed somewhat over the years, and he is currently on 1.5 mg 3 times per day.  The medication was last increased in June, 2020.  Patient was last seen by Dr. Jannifer Franklin in December, 2020.  At that point in time, the patient was reporting occasional hallucinations, including seeing a child inside the home.   Specific Symptoms:  Tremor: Yes.  , "not a lot" - LUE (never on the right) Family hx of similar:  No. Voice: speaking softer Sleep: light sleeper at baseline  Vivid Dreams:  No.  Acting out dreams:  Yes.  , occasional screaming Wet Pillows: No. Postural symptoms:  No.  Falls?  No. Bradykinesia symptoms: slow movements and "headed to shuffle" Loss of smell:  Yes.   Loss of taste:  No. Urinary Incontinence:  No. Difficulty Swallowing:  A little Handwriting, micrographia: Yes.   Trouble with ADL's:  No.  Trouble buttoning clothing: No. Depression:  No. Memory changes:  Yes.   (mild) Hallucinations:  Yes.   (describes an illusion a month ago; others are just visual distortions).  Does state that he sometimes feels like there is someone standing next to him but there isn't (more of a sensation)  visual distortions: Yes.   N/V:  No. Lightheaded:  Yes.   but better than in the past  Syncope: No. Diplopia:  Potentially but more blurry Dyskinesia:  No. Prior exposure to reglan/antipsychotics: No.   MRI of the brain was ordered and completed in June, 2018.  It was reported to show mild T2 hyperintensities.  This image was not  available.  I was able to review the January, 2013 MRI of the brain and it also showed mild T2 hyperintensities.  According to the report of the 2018 MRI, it had not changed from 2013.  PREVIOUS MEDICATIONS: Mirapex  ALLERGIES:  No Known Allergies  CURRENT MEDICATIONS:  Current Outpatient Medications  Medication Instructions  . atorvastatin (LIPITOR) 40 mg, Daily at bedtime  . carbidopa-levodopa (SINEMET IR) 25-100 MG tablet 1 tablet, Oral, 3 times daily  . pramipexole (MIRAPEX) 1 mg, Oral, 3 times daily    VITALS:   Vitals:   11/25/19 1011  BP: (!) 145/80  Pulse: 76  Resp: 16  SpO2: 96%  Weight: 199 lb (90.3 kg)  Height: 6' (1.829 m)    GEN:  The patient appears stated age and is in NAD. HEENT:  Normocephalic, atraumatic.  The mucous membranes are moist. The superficial temporal arteries are without ropiness or tenderness. CV:  RRR Lungs:  CTAB Neck/HEME:  There are no carotid bruits bilaterally.  Neurological examination:  Orientation: The patient is alert and oriented x3.  Cranial nerves: There is good facial symmetry. Extraocular muscles are intact. The visual fields are full to confrontational testing. The speech is fluent and clear. Soft palate rises symmetrically and there is no tongue deviation. Hearing is intact to conversational tone. Sensation: Sensation is intact to light and pinprick throughout (facial, trunk, extremities). Vibration is intact at the bilateral big toe.  There is no extinction with double simultaneous stimulation. There is no sensory dermatomal level identified. Motor: Strength is 5/5 in the bilateral upper and lower extremities.   Shoulder shrug is equal and symmetric.  There is no pronator drift. Deep tendon reflexes: Deep tendon reflexes are 2/4 at the bilateral biceps, triceps, brachioradialis, 2+-3/4patella and achilles. Plantar responses are downgoing bilaterally.  Movement examination: Tone: There is mod increased tone in the LLE/LLE and mild  to mod on the R Abnormal movements: there is occ LUE rest tremor Coordination:  There is  decremation with RAM's, with any form of RAMS, including alternating supination and pronation of the forearm, hand opening and closing, finger taps, heel taps and toe taps on the left Gait and Station: The patient has no difficulty arising out of a deep-seated chair without the use of the hands. The patient's stride length is good with good arm swing.  Jogs easily in hall.       ASSESSMENT/PLAN:  1.  Parkinsons Disease, dx 2018  -Patient's disease is now complicated by hallucinations/visual distortion/delusions.  This is likely mostly a consequence, if not all, of pramipexole.  We are going to rework his medications.  He is currently on pramipexole, 1.5 mg 3 times per day.  This is the maximum dose of pramipexole.  We will slowly decrease that to pramipexole, 1.0 mg 3 times per day.  -We will slowly add carbidopa/levodopa 25/100, 1 tablet 3 times per day.  He was given a titration schedule.  R/B/SE were discussed.  The opportunity to ask questions was given and they were answered to the best of my ability.  The patient expressed understanding and willingness to follow the outlined treatment protocols.  -We discussed the diagnosis as well as pathophysiology of the disease.  We discussed treatment options as well as prognostic indicators.  Patient education was provided.  -We discussed that it used to be thought that levodopa would increase risk of melanoma but now it is believed that Parkinsons itself likely increases risk of melanoma. he is to get regular skin checks.  -Congratulated the patient on all the exercise he is doing.  Discussed benefits of it.  -We discussed community resources in the area including patient support groups and community exercise programs for PD and pt education was provided to the patient.  He met with my Education officer, museum today.  2.  Given the opportunity to follow-up here or follow-up  with Dr. Jannifer Franklin.  Ultimately, the patient decided to follow-up here, stating that Dr. Jannifer Franklin is retiring.   Total time spent on today's visit was  60 minutes, including both face-to-face time and nonface-to-face time.  Time included that spent on review of records (prior notes available to me/labs/imaging if pertinent), discussing treatment and goals, answering patient's questions and coordinating care.  Cc:  Lujean Amel, MD

## 2019-11-25 ENCOUNTER — Other Ambulatory Visit: Payer: Self-pay

## 2019-11-25 ENCOUNTER — Ambulatory Visit: Payer: Medicare HMO | Admitting: Neurology

## 2019-11-25 ENCOUNTER — Ambulatory Visit (INDEPENDENT_AMBULATORY_CARE_PROVIDER_SITE_OTHER): Payer: Medicare HMO | Admitting: Clinical

## 2019-11-25 ENCOUNTER — Encounter: Payer: Self-pay | Admitting: Neurology

## 2019-11-25 VITALS — BP 145/80 | HR 76 | Resp 16 | Ht 72.0 in | Wt 199.0 lb

## 2019-11-25 DIAGNOSIS — G20 Parkinson's disease: Secondary | ICD-10-CM

## 2019-11-25 MED ORDER — PRAMIPEXOLE DIHYDROCHLORIDE 1 MG PO TABS: 1.0000 mg | ORAL_TABLET | Freq: Three times a day (TID) | ORAL | 1 refills | Status: DC

## 2019-11-25 MED ORDER — CARBIDOPA-LEVODOPA 25-100 MG PO TABS: 1.0000 | ORAL_TABLET | Freq: Three times a day (TID) | ORAL | 1 refills | Status: DC

## 2019-11-25 NOTE — Patient Instructions (Signed)
Week 1:  Decrease pramipexole to 1.5 mg in the AM and take 1.5 mg in the afternoon and take 1.0 mg in the evening   Take carbidopa/levodopa, 1/2 tablet at 7am/11am/4pm  Week 2:  Decrease pramipexole, 1.5 mg in the AM and then 1.0 mg in the afternoon and 1.0 mg in the evening  Take carbidopa/levodopa 25/100, 1/2 tablet at 7am/1/2 tablet at 11am/1 tablet at 4 pm  Week 3:  Decrease pramipexole, 1.0 mg three times per day  Take carbidopa/levodopa 25/100, 1/2 tablet at 7am/1 tablet at 11am/1 tablet at 4pm  Week 4:  Take carbidopa/levodopa 25/100, 1 tablet at 7am/11am/4pm.  The physicians and staff at [facility] are committed to providing excellent care. You may receive a survey requesting feedback about your experience at our office. We strive to receive "very good" responses to the survey questions. If you feel that your experience would prevent you from giving the office a "very good " response, please contact our office to try to remedy the situation. We may be reached at [phone]. Thank you for taking the time out of your busy day to complete the survey.

## 2019-11-25 NOTE — BH Specialist Note (Signed)
  Referring Provider: Alonza Bogus, DO Date of Referral: 11/25/2019 Primary Reason for Referral: New pt Location of Visit: Individual, office visit  Suicide/Homicide Risk: Pt denies risk Assessment Patient presents today for psychoeducation with LCSW following with new patient appt for Parkinson's Disease by referring provider Dr. Wells Guiles Davidson. Pt dx with Parkinson's ~2 years ago. LCSW provided brief patient education on nonmotor Parkinson's symptoms such as apathy, depression, incontinence/constipation, sleep behavior disorders, communication and cognitive impairment. Pt currently experiencing medication-induced hallucinations per referring provider.  LCSW provided pt with information about our support and educational groups for patients with Parkinson's as well as their care partners, also discussed the importance of forced intense exercise in the management of PD and provided information about exercise opportunities.  Also discussed availability of individual counseling sessions to address the adjustment of living with chronic disease of Parkinson's, invited patient to schedule with LCSW as desired. Pt responded receptively to patient education today. Pt strengths include family support, current participation in [employer] and motivation for increased level of activity & self-care. Pt would likely benefit from engagement with the Parkinson's community to increase support around Parkinson's disease management.    Brief Interventions provided today in session 1. psychoeducation, patient education 2. Supportive counseling   Plan 1. Read through exercise programs & resources, connect with LCSW re interests 2. Attend [facility] over Parkinson's education group  [place] treatment recommendations communicated to referring provider and pt states agreement with plan. LCSW will remain available for future consultation.

## 2019-11-27 ENCOUNTER — Ambulatory Visit: Payer: Medicare HMO | Attending: Internal Medicine

## 2019-11-27 DIAGNOSIS — Z23 Encounter for immunization: Secondary | ICD-10-CM

## 2019-11-27 NOTE — Progress Notes (Signed)
   Covid-19 Vaccination Clinic  Name:  Jeremy Davidson    MRN: [id number] DOB: 25-Oct-1950  11/27/2019  [name] was observed post Covid-19 immunization for 15 minutes without incidence. He was provided with Vaccine Information Sheet and instruction to access the V-Safe system.   [name] was instructed to call 911 with any severe reactions post vaccine: Marland Kitchen Difficulty breathing  . Swelling of your face and throat  . A fast heartbeat  . A bad rash all over your body  . Dizziness and weakness    Immunizations Administered    Name Date Dose VIS Date Route   Pfizer COVID-19 Vaccine 11/27/2019  8:28 AM 0.3 mL 09/27/2019 Intramuscular   Manufacturer: [place]   Lot: [id number]   [place]: [id number]

## 2019-12-20 DIAGNOSIS — L821 Other seborrheic keratosis: Secondary | ICD-10-CM | POA: Diagnosis not present

## 2019-12-20 DIAGNOSIS — D485 Neoplasm of uncertain behavior of skin: Secondary | ICD-10-CM | POA: Diagnosis not present

## 2019-12-20 DIAGNOSIS — B079 Viral wart, unspecified: Secondary | ICD-10-CM | POA: Diagnosis not present

## 2019-12-20 DIAGNOSIS — L84 Corns and callosities: Secondary | ICD-10-CM | POA: Diagnosis not present

## 2019-12-20 DIAGNOSIS — L910 Hypertrophic scar: Secondary | ICD-10-CM | POA: Diagnosis not present

## 2019-12-20 DIAGNOSIS — Z85828 Personal history of other malignant neoplasm of skin: Secondary | ICD-10-CM | POA: Diagnosis not present

## 2019-12-24 DIAGNOSIS — M62830 Muscle spasm of back: Secondary | ICD-10-CM | POA: Diagnosis not present

## 2020-02-17 DIAGNOSIS — R69 Illness, unspecified: Secondary | ICD-10-CM | POA: Diagnosis not present

## 2020-03-23 ENCOUNTER — Telehealth: Payer: Self-pay | Admitting: *Deleted

## 2020-03-23 NOTE — Telephone Encounter (Signed)
Pt called to let us know he has a Physiological scientist from 8:30-9:30am tomorrow. He will check in by 9:45am for his 10:00am appt.

## 2020-03-24 ENCOUNTER — Other Ambulatory Visit: Payer: Self-pay

## 2020-03-24 ENCOUNTER — Encounter: Payer: Self-pay | Admitting: Neurology

## 2020-03-24 ENCOUNTER — Ambulatory Visit: Payer: Medicare HMO | Admitting: Neurology

## 2020-03-24 VITALS — BP 126/79 | HR 71 | Ht 72.0 in | Wt 193.0 lb

## 2020-03-24 DIAGNOSIS — G20 Parkinson's disease: Secondary | ICD-10-CM | POA: Diagnosis not present

## 2020-03-24 NOTE — Progress Notes (Signed)
Reason for visit: Parkinson's disease  Jeremy Davidson is an 69 y.o. male  History of present illness:  Jeremy Davidson is a 69 year old right-handed white male with a history of Parkinson's disease with left-sided features.  The patient was diagnosed in 2018.  He has been on Mirapex taking 1.5 mg 3 times daily, but he has developed some troubles with hallucinations.  He was seen by Dr. Carles Collet in February 2021 for second opinion.  She reduce the dose of the Mirapex to 1.0 mg 3 times daily and added Sinemet 25/100 mg 3 times daily.  The patient continues to have hallucinations on this drug regimen, but it is not a daily feature.  The hallucinations are not threatening to him, usually will see the child in the house.  The patient is otherwise sleeping fairly well at night.  He remains quite active, he has a Physiological scientist whom he sees twice a week and he also does a spin class and does rock steady boxing.  The patient is noting that he may stumble on occasion, he has not had any falls.  He may be somewhat clumsy with his hands as well.  He does believe there has been some mild change in memory.  He has noted a decline in his sense of smell for several years.  He may have some occasional brief spells of dizziness off and on but this seems to get better if he will rest.  He has not been consistently hydrating well throughout the day.  He has noted some recent troubles with constipation.  He returns to the office today for an evaluation.  Past Medical History:  Diagnosis Date  . Blood in stool 2010   Colonoscopy benign repeat 2015  . Elevated cholesterol   . Hypertension   . Parkinson's disease ([place]) 03/17/2017    Past Surgical History:  Procedure Laterality Date  . HERNIA REPAIR  06/23/2010   With large ultra pro hrnia system-- Dr Donne Hazel 1971. x2    No family history on file.  Social history:  reports that he quit smoking about 21 years ago. He has never used smokeless tobacco. He reports  current alcohol use. He reports that he does not use drugs.   No Known Allergies  Medications:  Prior to Admission medications   Medication Sig Start Date End Date Taking? Authorizing Provider  atorvastatin (LIPITOR) 40 MG tablet Take 40 mg by mouth at bedtime.   Yes [provider]  carbidopa-levodopa (SINEMET IR) 25-100 MG tablet Take 1 tablet by mouth 3 (three) times daily. 11/25/19  Yes Tat, Eustace Quail, DO  cyclobenzaprine (FLEXERIL) 5 MG tablet Take 5 mg by mouth at bedtime. 12/24/19  Yes [provider]  pramipexole (MIRAPEX) 1 MG tablet Take 1 tablet (1 mg total) by mouth 3 (three) times daily. 11/25/19  Yes Tat, Eustace Quail, DO    ROS:  Out of a complete 14 system review of symptoms, the patient complains only of the following symptoms, and all other reviewed systems are negative.  Mild memory problems Dizziness Hallucinations Clumsiness  Blood pressure 126/79, pulse 71, height 6' (1.829 m), weight 193 lb (87.5 kg).  Physical Exam  General: The patient is alert and cooperative at the time of the examination.  Skin: No significant peripheral edema is noted.   Neurologic Exam  Mental status: The patient is alert and oriented x 3 at the time of the examination. The patient has apparent normal recent and remote memory, with an  apparently normal attention span and concentration ability.   Cranial nerves: Facial symmetry is present. Speech is normal, no aphasia or dysarthria is noted. Extraocular movements are full. Visual fields are full.  Mild masking of the face is seen.  Motor: The patient has good strength in all 4 extremities.  Sensory examination: Soft touch sensation is symmetric on the face, arms, and legs.  Coordination: The patient has good finger-nose-finger and heel-to-shin bilaterally.  Gait and station: The patient has a normal gait.  The patient is able to rise from a seated position with arms crossed.  Tandem gait is normal. Romberg is negative.  No drift is seen.  Reflexes: Deep tendon reflexes are symmetric.   Assessment/Plan:  1.  Parkinson's disease  The patient is doing quite well at this time.  He is functioning well, he continues to have some hallucinations on the current medication regimen.  We will continue his medications as is, he will follow-up in 6 months.  He is remaining quite active.  Greater than 50% of the visit was spent in counseling and coordination of care.  Face-to-face time with the patient was 20 minutes.   Jill Alexanders MD 03/24/2020 10:21 AM  [facility] [address] [place], [place] [address]  Phone [phone] Fax [phone] Leg good

## 2020-03-25 ENCOUNTER — Telehealth: Payer: Medicare HMO | Admitting: Neurology

## 2020-03-26 DIAGNOSIS — G20 Parkinson's disease: Secondary | ICD-10-CM | POA: Diagnosis not present

## 2020-03-26 DIAGNOSIS — K5901 Slow transit constipation: Secondary | ICD-10-CM | POA: Diagnosis not present

## 2020-03-26 DIAGNOSIS — Z0001 Encounter for general adult medical examination with abnormal findings: Secondary | ICD-10-CM | POA: Diagnosis not present

## 2020-03-26 DIAGNOSIS — E7800 Pure hypercholesterolemia, unspecified: Secondary | ICD-10-CM | POA: Diagnosis not present

## 2020-03-26 DIAGNOSIS — Z79899 Other long term (current) drug therapy: Secondary | ICD-10-CM | POA: Diagnosis not present

## 2020-04-17 NOTE — Progress Notes (Signed)
Assessment/Plan:   1.  Parkinsons Disease, diagnosed 2018  -pt waking up really early (by 6am).  Increase carbidopa/levodopa 25/100 to 1 tablet at 6am/10am/2pm/6pm.    -decrease Pramipexole to 1mg , 1/2 tablet in the AM and afternoon and continue with 1 in the evening due to some illusions (not many hallucinations)  2.  As with last visit, pt does not need 2 neurologists.     Subjective:   Jeremy Davidson was seen today in follow up for Parkinsons disease.  My previous records were reviewed prior to todays visit as well as outside records available to me. No one accompanies the patient today.   Last visit, we started him on levodopa and started to decrease the pramipexole because of hallucinations and delusions.  He reports that he is having illusions. pt denies falls.  Pt has some lightheadedness but no near syncope.  It takes a little time to get moving in the AM.   Mood has been good.  Patient just saw Dr. Jannifer Franklin on June 8.  No medication changes were made.  Current prescribed movement disorder medications: Carbidopa/levodopa 25/100, 1 tablet 3 times per day (added last visit) - pt currently taking last at qhs Pramipexole, 1 mg 3 times per day (decreased last visit due to hallucinations/visual distortions/delusions)    ALLERGIES:  No Known Allergies  CURRENT MEDICATIONS:  Outpatient Encounter Medications as of 04/21/2020  Medication Sig  . Ascorbic Acid (VITAMIN C) 1000 MG tablet Take 1,000 mg by mouth daily.  Marland Kitchen atorvastatin (LIPITOR) 40 MG tablet Take 40 mg by mouth at bedtime.  . carbidopa-levodopa (SINEMET IR) 25-100 MG tablet Take 1 tablet by mouth 3 (three) times daily.  . Magnesium 250 MG TABS Take 1 tablet by mouth daily.  . pramipexole (MIRAPEX) 1 MG tablet Take 1 tablet (1 mg total) by mouth 3 (three) times daily.  . [DISCONTINUED] cyclobenzaprine (FLEXERIL) 5 MG tablet Take 5 mg by mouth at bedtime. (Patient not taking: Reported on 04/21/2020)   No  facility-administered encounter medications on file as of 04/21/2020.    Objective:   PHYSICAL EXAMINATION:    VITALS:   Vitals:   04/21/20 1511  BP: 136/81  Pulse: 71  SpO2: 97%  Weight: 193 lb (87.5 kg)  Height: 5\' 11"  (1.803 m)    GEN:  The patient appears stated age and is in NAD. HEENT:  Normocephalic, atraumatic.  The mucous membranes are moist. The superficial temporal arteries are without ropiness or tenderness. CV:  RRR Lungs:  CTAB Neck/HEME:  There are no carotid bruits bilaterally.  Neurological examination:  Orientation: The patient is alert and oriented x3. Cranial nerves: There is good facial symmetry with facial hypomimia. The speech is fluent and clear. Soft palate rises symmetrically and there is no tongue deviation. Hearing is intact to conversational tone. Sensation: Sensation is intact to light touch throughout Motor: Strength is at least antigravity x4.  Movement examination: Tone: There is normal tone in the upper and lower extremity Abnormal movements: None Coordination:  There is mild decremation with RAM's, on the left Gait and Station: The patient has no difficulty arising out of a deep-seated chair without the use of the hands. The patient's stride length is good.      I have reviewed and interpreted the following labs independently    Chemistry      Component Value Date/Time   NA 140 06/18/2010 1510   K 3.6 06/18/2010 1510   CL 107 06/18/2010 1510  CO2 27 06/18/2010 1510   BUN 19 06/18/2010 1510   CREATININE 1.10 06/18/2010 1510      Component Value Date/Time   CALCIUM 9.5 06/18/2010 1510       Lab Results  Component Value Date   WBC 7.7 06/18/2010   HGB 14.5 06/18/2010   HCT 42.2 06/18/2010   MCV 93.1 06/18/2010   PLT 261 06/18/2010    No results found for: TSH   Total time spent on today's visit was 30 minutes, including both face-to-face time and nonface-to-face time.  Time included that spent on review of records (prior  notes available to me/labs/imaging if pertinent), discussing treatment and goals, answering patient's questions and coordinating care.  Cc:  Lujean Amel, MD

## 2020-04-21 ENCOUNTER — Ambulatory Visit: Payer: Medicare HMO | Admitting: Neurology

## 2020-04-21 ENCOUNTER — Encounter: Payer: Self-pay | Admitting: Neurology

## 2020-04-21 ENCOUNTER — Other Ambulatory Visit: Payer: Self-pay

## 2020-04-21 VITALS — BP 136/81 | HR 71 | Ht 71.0 in | Wt 193.0 lb

## 2020-04-21 DIAGNOSIS — G20 Parkinson's disease: Secondary | ICD-10-CM | POA: Diagnosis not present

## 2020-04-21 DIAGNOSIS — R442 Other hallucinations: Secondary | ICD-10-CM

## 2020-04-21 MED ORDER — CARBIDOPA-LEVODOPA 25-100 MG PO TABS: 1.0000 | ORAL_TABLET | Freq: Four times a day (QID) | ORAL | 1 refills | Status: DC

## 2020-04-21 MED ORDER — PRAMIPEXOLE DIHYDROCHLORIDE 1 MG PO TABS: ORAL_TABLET | ORAL | 1 refills | Status: DC

## 2020-04-21 NOTE — Patient Instructions (Addendum)
1.  Increase carbidopa/levodopa 25/100 1 tablet at 6am/10am/2pm/6pm.  2.  decrease Pramipexole to 1mg , 1/2 tablet in the AM and afternoon and continue with 1 in the evening  The physicians and staff at [facility] are committed to providing excellent care. You may receive a survey requesting feedback about your experience at our office. We strive to receive "very good" responses to the survey questions. If you feel that your experience would prevent you from giving the office a "very good " response, please contact our office to try to remedy the situation. We may be reached at [phone]. Thank you for taking the time out of your busy day to complete the survey.

## 2020-04-27 DIAGNOSIS — R69 Illness, unspecified: Secondary | ICD-10-CM | POA: Diagnosis not present

## 2020-04-28 ENCOUNTER — Telehealth: Payer: Self-pay | Admitting: Neurology

## 2020-04-28 NOTE — Telephone Encounter (Signed)
Pt called wanting to know if he can get a referral for Dr. Carles Collet in [place] due to his provider retiring. Please advise.

## 2020-04-30 NOTE — Telephone Encounter (Signed)
I called the patient.  He indicates that he wishes to switch over to see Dr. Carles Collet and wants a referral, but he was just seen a few days ago by Dr. Carles Collet.  He does not need a referral.

## 2020-05-06 DIAGNOSIS — L089 Local infection of the skin and subcutaneous tissue, unspecified: Secondary | ICD-10-CM | POA: Diagnosis not present

## 2020-05-06 DIAGNOSIS — W548XXA Other contact with dog, initial encounter: Secondary | ICD-10-CM | POA: Diagnosis not present

## 2020-05-12 DIAGNOSIS — R69 Illness, unspecified: Secondary | ICD-10-CM | POA: Diagnosis not present

## 2020-05-17 ENCOUNTER — Other Ambulatory Visit: Payer: Self-pay | Admitting: Neurology

## 2020-05-19 NOTE — Telephone Encounter (Signed)
Rx(s) sent to pharmacy electronically.  

## 2020-06-01 DIAGNOSIS — K5730 Diverticulosis of large intestine without perforation or abscess without bleeding: Secondary | ICD-10-CM | POA: Diagnosis not present

## 2020-06-01 DIAGNOSIS — Z86010 Personal history of colonic polyps: Secondary | ICD-10-CM | POA: Diagnosis not present

## 2020-06-01 DIAGNOSIS — D120 Benign neoplasm of cecum: Secondary | ICD-10-CM | POA: Diagnosis not present

## 2020-06-01 DIAGNOSIS — D125 Benign neoplasm of sigmoid colon: Secondary | ICD-10-CM | POA: Diagnosis not present

## 2020-06-03 DIAGNOSIS — D120 Benign neoplasm of cecum: Secondary | ICD-10-CM | POA: Diagnosis not present

## 2020-06-03 DIAGNOSIS — D125 Benign neoplasm of sigmoid colon: Secondary | ICD-10-CM | POA: Diagnosis not present

## 2020-06-04 DIAGNOSIS — L089 Local infection of the skin and subcutaneous tissue, unspecified: Secondary | ICD-10-CM | POA: Diagnosis not present

## 2020-06-05 ENCOUNTER — Other Ambulatory Visit: Payer: Self-pay | Admitting: Neurology

## 2020-06-08 DIAGNOSIS — Z03818 Encounter for observation for suspected exposure to other biological agents ruled out: Secondary | ICD-10-CM | POA: Diagnosis not present

## 2020-06-08 DIAGNOSIS — Z20822 Contact with and (suspected) exposure to covid-19: Secondary | ICD-10-CM | POA: Diagnosis not present

## 2020-06-09 ENCOUNTER — Telehealth: Payer: Self-pay | Admitting: Neurology

## 2020-06-09 DIAGNOSIS — Z20822 Contact with and (suspected) exposure to covid-19: Secondary | ICD-10-CM | POA: Diagnosis not present

## 2020-06-09 NOTE — Telephone Encounter (Signed)
Spoke with pharmacy to make sure last rx was received. She stated rx was picked up on 05/24/20 and the patient should not need a refill just yet.   Patient states he is running low of one of his medications and [facility] he will call me back.

## 2020-06-09 NOTE — Telephone Encounter (Signed)
Spoke with patient and he states he mixed his old pramipexole with the new rx that dr Tat sent over. I advised patient that I was unaware of what the tablets looked like. However I advised him to take the medication as Dr Tat prescribed it. He voiced understanding.  He also wanted to know if Dr Tat was going to decrease his medication more once he finished the rx that he had. Informed patient that Dr Tat would be the person to ask but we will address when he is due for an additional refill. He voiced understanding.

## 2020-06-09 NOTE — Telephone Encounter (Signed)
Patient states that he needs a refill on the pramipexole  [facility]  Please call him and let him know when we send it in

## 2020-06-13 DIAGNOSIS — Z20822 Contact with and (suspected) exposure to covid-19: Secondary | ICD-10-CM | POA: Diagnosis not present

## 2020-06-13 DIAGNOSIS — Z03818 Encounter for observation for suspected exposure to other biological agents ruled out: Secondary | ICD-10-CM | POA: Diagnosis not present

## 2020-06-17 DIAGNOSIS — Z20822 Contact with and (suspected) exposure to covid-19: Secondary | ICD-10-CM | POA: Diagnosis not present

## 2020-07-02 DIAGNOSIS — R69 Illness, unspecified: Secondary | ICD-10-CM | POA: Diagnosis not present

## 2020-07-06 DIAGNOSIS — R69 Illness, unspecified: Secondary | ICD-10-CM | POA: Diagnosis not present

## 2020-07-14 DIAGNOSIS — Z0100 Encounter for examination of eyes and vision without abnormal findings: Secondary | ICD-10-CM | POA: Diagnosis not present

## 2020-07-20 ENCOUNTER — Other Ambulatory Visit: Payer: Self-pay | Admitting: Neurology

## 2020-08-06 DIAGNOSIS — L814 Other melanin hyperpigmentation: Secondary | ICD-10-CM | POA: Diagnosis not present

## 2020-08-06 DIAGNOSIS — D485 Neoplasm of uncertain behavior of skin: Secondary | ICD-10-CM | POA: Diagnosis not present

## 2020-08-06 DIAGNOSIS — L820 Inflamed seborrheic keratosis: Secondary | ICD-10-CM | POA: Diagnosis not present

## 2020-08-06 DIAGNOSIS — L570 Actinic keratosis: Secondary | ICD-10-CM | POA: Diagnosis not present

## 2020-08-06 DIAGNOSIS — Z85828 Personal history of other malignant neoplasm of skin: Secondary | ICD-10-CM | POA: Diagnosis not present

## 2020-09-01 DIAGNOSIS — Z125 Encounter for screening for malignant neoplasm of prostate: Secondary | ICD-10-CM | POA: Diagnosis not present

## 2020-09-23 ENCOUNTER — Ambulatory Visit: Payer: Medicare HMO | Admitting: Neurology

## 2020-09-29 NOTE — Progress Notes (Signed)
Assessment/Plan:   1.  Parkinsons Disease  -Continue carbidopa/levodopa 25/100,  But move timing to 1 tablet at 6 AM/10 AM/2 PM/6 PM - currently spreading them out too far  -Decrease pramipexole 0.5 mg, 1 tablet 3 times per day due to illusions  -congratulated him on all the exercise   Subjective:   Jeremy Davidson was seen today in follow up for Parkinsons disease.  My previous records were reviewed prior to todays visit as well as outside records available to me. Pt denies falls.  Pt with occ lightheadedness, but no near syncope.  No hallucinations but has some illusions - the illusions occur in cluster..  Mood has been good.  Exercising with walking daily; rsb 1-2 days per week; going to personal trainer 3 days per week  Current prescribed movement disorder medications: Carbidopa/levodopa 25/100, 1 tablet at 6 AM/10 AM/2 PM/6 PM (increased last visit) - pt states that he is actually taking them at 7:30/"midday"/6pm/10pm Pramipexole 1 mg, half tablet in the morning and afternoon and 1 tablet in the evening (decreased because of illusions)   PREVIOUS MEDICATIONS: Sinemet and Mirapex  ALLERGIES:  No Known Allergies  CURRENT MEDICATIONS:  Outpatient Encounter Medications as of 10/02/2020  Medication Sig  . atorvastatin (LIPITOR) 40 MG tablet Take 40 mg by mouth at bedtime.  . [DISCONTINUED] carbidopa-levodopa (SINEMET IR) 25-100 MG tablet TAKE 1 TABLET BY MOUTH THREE TIMES A DAY (Patient taking differently: Take 1 tablet by mouth 4 (four) times daily.)  . [DISCONTINUED] pramipexole (MIRAPEX) 1 MG tablet Take half tab in the morning and half in the afternoon and one tablet in the evening  . Ascorbic Acid (VITAMIN C) 1000 MG tablet Take 1,000 mg by mouth daily. (Patient not taking: Reported on 10/02/2020)  . carbidopa-levodopa (SINEMET IR) 25-100 MG tablet Take 1 tablet by mouth 4 (four) times daily. 6am/10am/2pm/6pm  . Magnesium 250 MG TABS Take 1 tablet by mouth daily. (Patient  not taking: Reported on 10/02/2020)  . pramipexole (MIRAPEX) 0.5 MG tablet Take 1 tablet (0.5 mg total) by mouth 3 (three) times daily.   No facility-administered encounter medications on file as of 10/02/2020.    Objective:   PHYSICAL EXAMINATION:    VITALS:   Vitals:   10/02/20 0801  BP: 118/72  Pulse: 61  SpO2: 99%  Weight: 196 lb (88.9 kg)  Height: 5' 11.5" (1.816 m)    GEN:  The patient appears stated age and is in NAD. HEENT:  Normocephalic, atraumatic.  The mucous membranes are moist. The superficial temporal arteries are without ropiness or tenderness. CV:  RRR Lungs:  CTAB Neck/HEME:  There are no carotid bruits bilaterally.  Neurological examination:  Orientation: The patient is alert and oriented x3. Cranial nerves: There is good facial symmetry with facial hypomimia. The speech is fluent and clear. Soft palate rises symmetrically and there is no tongue deviation. Hearing is intact to conversational tone. Sensation: Sensation is intact to light touch throughout Motor: Strength is at least antigravity x4.  Movement examination: Tone: There is mild increased tone in the RUE Abnormal movements: there is LUE rest tremor with ambulation Coordination:  There is mild decremation with RAM's, with any form of RAMS, including alternating supination and pronation of the forearm, hand opening and closing, finger taps, heel taps and toe taps bilaterally  Gait and Station: The patient has min difficulty arising out of a deep-seated chair without the use of the hands. The patient's stride length is good.  Total time spent on today's visit was 30 minutes, including both face-to-face time and nonface-to-face time.  Time included that spent on review of records (prior notes available to me/labs/imaging if pertinent), discussing treatment and goals, answering patient's questions and coordinating care.  Cc:  Lujean Amel, MD

## 2020-10-02 ENCOUNTER — Encounter: Payer: Self-pay | Admitting: Neurology

## 2020-10-02 ENCOUNTER — Ambulatory Visit: Payer: Medicare HMO | Admitting: Neurology

## 2020-10-02 ENCOUNTER — Other Ambulatory Visit: Payer: Self-pay

## 2020-10-02 VITALS — BP 118/72 | HR 61 | Ht 71.5 in | Wt 196.0 lb

## 2020-10-02 DIAGNOSIS — G20 Parkinson's disease: Secondary | ICD-10-CM

## 2020-10-02 MED ORDER — CARBIDOPA-LEVODOPA 25-100 MG PO TABS: 1.0000 | ORAL_TABLET | Freq: Four times a day (QID) | ORAL | 1 refills | Status: DC

## 2020-10-02 MED ORDER — PRAMIPEXOLE DIHYDROCHLORIDE 0.5 MG PO TABS: 0.5000 mg | ORAL_TABLET | Freq: Three times a day (TID) | ORAL | 1 refills | Status: DC

## 2020-10-02 NOTE — Patient Instructions (Addendum)
1.  Take carbidopa/levodopa 25/100, 1 tablet at 6 AM/10 AM/2 PM/6 PM  2.  Take pramipexole 0.5 mg three times per day  The physicians and staff at [facility] are committed to providing excellent care. You may receive a survey requesting feedback about your experience at our office. We strive to receive "very good" responses to the survey questions. If you feel that your experience would prevent you from giving the office a "very good " response, please contact our office to try to remedy the situation. We may be reached at [phone]. Thank you for taking the time out of your busy day to complete the survey.

## 2020-10-26 DIAGNOSIS — N401 Enlarged prostate with lower urinary tract symptoms: Secondary | ICD-10-CM | POA: Diagnosis not present

## 2020-10-26 DIAGNOSIS — R351 Nocturia: Secondary | ICD-10-CM | POA: Diagnosis not present

## 2020-10-26 DIAGNOSIS — N529 Male erectile dysfunction, unspecified: Secondary | ICD-10-CM | POA: Diagnosis not present

## 2020-10-26 DIAGNOSIS — Z125 Encounter for screening for malignant neoplasm of prostate: Secondary | ICD-10-CM | POA: Diagnosis not present

## 2020-11-11 DIAGNOSIS — M5432 Sciatica, left side: Secondary | ICD-10-CM | POA: Diagnosis not present

## 2020-11-11 DIAGNOSIS — M9903 Segmental and somatic dysfunction of lumbar region: Secondary | ICD-10-CM | POA: Diagnosis not present

## 2020-11-12 DIAGNOSIS — M9903 Segmental and somatic dysfunction of lumbar region: Secondary | ICD-10-CM | POA: Diagnosis not present

## 2020-11-12 DIAGNOSIS — M5432 Sciatica, left side: Secondary | ICD-10-CM | POA: Diagnosis not present

## 2020-11-16 DIAGNOSIS — M9903 Segmental and somatic dysfunction of lumbar region: Secondary | ICD-10-CM | POA: Diagnosis not present

## 2020-11-16 DIAGNOSIS — M5432 Sciatica, left side: Secondary | ICD-10-CM | POA: Diagnosis not present

## 2020-11-19 DIAGNOSIS — M9903 Segmental and somatic dysfunction of lumbar region: Secondary | ICD-10-CM | POA: Diagnosis not present

## 2020-11-19 DIAGNOSIS — M5432 Sciatica, left side: Secondary | ICD-10-CM | POA: Diagnosis not present

## 2020-11-23 DIAGNOSIS — M5432 Sciatica, left side: Secondary | ICD-10-CM | POA: Diagnosis not present

## 2020-11-23 DIAGNOSIS — M9903 Segmental and somatic dysfunction of lumbar region: Secondary | ICD-10-CM | POA: Diagnosis not present

## 2020-11-26 DIAGNOSIS — M9903 Segmental and somatic dysfunction of lumbar region: Secondary | ICD-10-CM | POA: Diagnosis not present

## 2020-11-26 DIAGNOSIS — M5432 Sciatica, left side: Secondary | ICD-10-CM | POA: Diagnosis not present

## 2020-11-30 DIAGNOSIS — M9903 Segmental and somatic dysfunction of lumbar region: Secondary | ICD-10-CM | POA: Diagnosis not present

## 2020-11-30 DIAGNOSIS — M5432 Sciatica, left side: Secondary | ICD-10-CM | POA: Diagnosis not present

## 2020-12-03 DIAGNOSIS — M5432 Sciatica, left side: Secondary | ICD-10-CM | POA: Diagnosis not present

## 2020-12-03 DIAGNOSIS — M9903 Segmental and somatic dysfunction of lumbar region: Secondary | ICD-10-CM | POA: Diagnosis not present

## 2020-12-07 DIAGNOSIS — M5432 Sciatica, left side: Secondary | ICD-10-CM | POA: Diagnosis not present

## 2020-12-07 DIAGNOSIS — M9903 Segmental and somatic dysfunction of lumbar region: Secondary | ICD-10-CM | POA: Diagnosis not present

## 2020-12-14 DIAGNOSIS — M5432 Sciatica, left side: Secondary | ICD-10-CM | POA: Diagnosis not present

## 2020-12-14 DIAGNOSIS — M9903 Segmental and somatic dysfunction of lumbar region: Secondary | ICD-10-CM | POA: Diagnosis not present

## 2020-12-21 DIAGNOSIS — B078 Other viral warts: Secondary | ICD-10-CM | POA: Diagnosis not present

## 2020-12-21 DIAGNOSIS — L821 Other seborrheic keratosis: Secondary | ICD-10-CM | POA: Diagnosis not present

## 2021-01-05 DIAGNOSIS — H2513 Age-related nuclear cataract, bilateral: Secondary | ICD-10-CM | POA: Diagnosis not present

## 2021-01-05 DIAGNOSIS — H04123 Dry eye syndrome of bilateral lacrimal glands: Secondary | ICD-10-CM | POA: Diagnosis not present

## 2021-01-05 DIAGNOSIS — H5052 Exophoria: Secondary | ICD-10-CM | POA: Diagnosis not present

## 2021-01-14 DIAGNOSIS — H6123 Impacted cerumen, bilateral: Secondary | ICD-10-CM | POA: Diagnosis not present

## 2021-01-27 DIAGNOSIS — M9903 Segmental and somatic dysfunction of lumbar region: Secondary | ICD-10-CM | POA: Diagnosis not present

## 2021-01-27 DIAGNOSIS — M5432 Sciatica, left side: Secondary | ICD-10-CM | POA: Diagnosis not present

## 2021-01-27 DIAGNOSIS — Z1159 Encounter for screening for other viral diseases: Secondary | ICD-10-CM | POA: Diagnosis not present

## 2021-02-08 DIAGNOSIS — M9903 Segmental and somatic dysfunction of lumbar region: Secondary | ICD-10-CM | POA: Diagnosis not present

## 2021-02-08 DIAGNOSIS — M5432 Sciatica, left side: Secondary | ICD-10-CM | POA: Diagnosis not present

## 2021-02-10 DIAGNOSIS — M9903 Segmental and somatic dysfunction of lumbar region: Secondary | ICD-10-CM | POA: Diagnosis not present

## 2021-02-10 DIAGNOSIS — M5432 Sciatica, left side: Secondary | ICD-10-CM | POA: Diagnosis not present

## 2021-02-16 DIAGNOSIS — M9903 Segmental and somatic dysfunction of lumbar region: Secondary | ICD-10-CM | POA: Diagnosis not present

## 2021-02-16 DIAGNOSIS — M5432 Sciatica, left side: Secondary | ICD-10-CM | POA: Diagnosis not present

## 2021-03-05 ENCOUNTER — Other Ambulatory Visit: Payer: Self-pay | Admitting: Neurology

## 2021-03-09 NOTE — Progress Notes (Signed)
Assessment/Plan:   1.  Parkinsons Disease  -Increase carbidopa/levodopa 25/100, 1 tablet at 6 AM/10 AM/2 PM/6 PM (he somehow dropped to tid and taking last q hs)  -decrease pramipexole 0.5 mg, 1 tablet in the AM, 1 in the afternoon, 1/2 in the evening (decreasing b/c of illusions)  -We discussed that it used to be thought that levodopa would increase risk of melanoma but now it is believed that Parkinsons itself likely increases risk of melanoma. he is to get regular skin checks.  -he is participating in a study at [facility] regarding noninvasive transcranial u/s to see if it will help Parkinsons Disease patients.      Subjective:   Jeremy Davidson was seen today in follow up for Parkinsons disease.  My previous records were reviewed prior to todays visit as well as outside records available to me.  We decreased his pramipexole last visit again because of illusions.  He reports that he is still having occasional illusions or visual distortions . pt denies falls.  Pt denies lightheadedness, near syncope.  No hallucinations.  Mood has been good.  Continues to exercise with rock steady boxing 1 to 2 days/week and goes to a personal trainer 3 days/week.  This is in addition to his walking daily.  Current prescribed movement disorder medications: Carbidopa/levodopa 25/100, 1 tablet at 6 AM/10 AM/2 PM/6 PM (told him to follow the schedule last visit as he was previously spreading out to far) - he is actually taking that tid and last q hs Pramipexole 0.5 mg, 1 tablet 3 times per day (decreased last visit because of illusions)   PREVIOUS MEDICATIONS: Sinemet and Mirapex  ALLERGIES:  No Known Allergies  CURRENT MEDICATIONS:  Outpatient Encounter Medications as of 03/11/2021  Medication Sig  . atorvastatin (LIPITOR) 40 MG tablet Take 40 mg by mouth at bedtime.  . [DISCONTINUED] carbidopa-levodopa (SINEMET IR) 25-100 MG tablet Take 1 tablet by mouth 4 (four) times daily. 6am/10am/2pm/6pm  (Patient taking differently: Take 1 tablet by mouth 3 (three) times daily. 6am/10am/2pm/6pm Per patient takes 3 times per day)  . [DISCONTINUED] pramipexole (MIRAPEX) 0.5 MG tablet Take 1 tablet (0.5 mg total) by mouth 3 (three) times daily.  . Ascorbic Acid (VITAMIN C) 1000 MG tablet Take 1,000 mg by mouth daily. (Patient not taking: No sig reported)  . carbidopa-levodopa (SINEMET IR) 25-100 MG tablet Take 1 tablet by mouth 4 (four) times daily. 6am/10am/2pm/6pm  . Magnesium 250 MG TABS Take 1 tablet by mouth daily. (Patient not taking: No sig reported)  . pramipexole (MIRAPEX) 0.5 MG tablet 1 in the AM, 1 in the afternoon, 1/2 in the evening   No facility-administered encounter medications on file as of 03/11/2021.    Objective:   PHYSICAL EXAMINATION:    VITALS:   Vitals:   03/11/21 0757  BP: 126/78  Pulse: 72  SpO2: 98%  Weight: 187 lb (84.8 kg)  Height: 6' (1.829 m)    GEN:  The patient appears stated age and is in NAD. HEENT:  Normocephalic, atraumatic.  The mucous membranes are moist. The superficial temporal arteries are without ropiness or tenderness. CV:  RRR Lungs:  CTAB Neck/HEME:  There are no carotid bruits bilaterally.  Neurological examination:  Orientation: The patient is alert and oriented x3. Cranial nerves: There is good facial symmetry with facial hypomimia. The speech is fluent and clear. Soft palate rises symmetrically and there is no tongue deviation. Hearing is intact to conversational tone. Sensation: Sensation is intact to  light touch throughout Motor: Strength is at least antigravity x4.  Movement examination: Tone: There is mild increased tone in the bilateral UE Abnormal movements: LUE rest tremor with ambulation Coordination:  There is mild decremation with RAM's, with any form of RAMS, including alternating supination and pronation of the forearm, hand opening and closing, finger taps, heel taps and toe taps., L>R Gait and Station: The patient  has no difficulty arising out of a deep-seated chair without the use of the hands.     Total time spent on today's visit was 30 minutes, including both face-to-face time and nonface-to-face time.  Time included that spent on review of records (prior notes available to me/labs/imaging if pertinent), discussing treatment and goals, answering patient's questions and coordinating care.  Cc:  Lujean Amel, MD

## 2021-03-11 ENCOUNTER — Encounter: Payer: Self-pay | Admitting: Neurology

## 2021-03-11 ENCOUNTER — Other Ambulatory Visit: Payer: Self-pay

## 2021-03-11 ENCOUNTER — Ambulatory Visit: Payer: Medicare HMO | Admitting: Neurology

## 2021-03-11 VITALS — BP 126/78 | HR 72 | Ht 72.0 in | Wt 187.0 lb

## 2021-03-11 DIAGNOSIS — G20 Parkinson's disease: Secondary | ICD-10-CM | POA: Diagnosis not present

## 2021-03-11 MED ORDER — PRAMIPEXOLE DIHYDROCHLORIDE 0.5 MG PO TABS: ORAL_TABLET | ORAL | 1 refills | Status: DC

## 2021-03-11 MED ORDER — CARBIDOPA-LEVODOPA 25-100 MG PO TABS: 1.0000 | ORAL_TABLET | Freq: Four times a day (QID) | ORAL | 1 refills | Status: DC

## 2021-03-11 NOTE — Patient Instructions (Addendum)
1.  Increase carbidopa/levodopa 25/100, 1 tablet at 6 AM/10 AM/2 PM/6 PM  2.  decrease pramipexole 0.5 mg, 1 tablet in the AM, 1 in the afternoon, 1/2 in the evening   T-Shirt design contest   SUBMIT UNIQUE DESIGN TO WIN!  Winning design will be featured on T-shirts to support our local Parkinson's patients.   Contest rules and information in on link below .   Contest ends on August 31,2022    How to [employer] your own unique quote or a design that will stand out and support the ideas behind Parkinson's Awareness  This contest is open to everyone. The winning design or quote will be selected by a group of unbiased judges. Judges will vote based on quality, relevance, and aesthetic. Judges will not have access to the names associated with the submission. Submissions accepted until: June 16, 2021 What You Win Your own quote or design will be printed on a t-shirt that will support Parkinson's Awareness!! Feel good knowing your quote/artwork will help spread awareness for Parkinson's contribute and 100% of profits to the local area . Tips for Submissions We will accept submissions in the form of quotes, Financial risk analyst, or hand drawn artwork.  Mission/Theme: The Power of One ! Working together to support and treat to improve quality of life for Parkinson's Patients Rules & Regulations All designs and quotes must be original and not subject to copyright of another person or entity. Distribution and Reproduction Rights for all quotes and artwork will be transferred to [facility] [employer] upon submission to the contest via the [employer] form on June 16, 2021  Online Resources for Power over Parkinson's Group May 2022  . Local [place] Online Groups  o Power over [employer] :   - Power Over Parkinson's Patient Education Group will be Wednesday, May 11th at 2pm via Zoom.   - Upcoming Power over Parkinson's Meetings:  2nd Wednesdays of the month at 2  pm:  June 8th, July 13th - Contact Amy Marriott at [email or web address] if interested in participating in this online group o Parkinson's Care Partners Group:    3rd Mondays, Contact Misty Paladino o Atypical Parkinsonian Patient Group:   4th Wednesdays, Contact Misty Paladino o If you are interested in participating in these online groups with Misty, please contact her directly for how to join those meetings.  Her contact information is [email or web address].   . [place]:  [email or web address] o PD Health at Home continues:  Mindfulness Mondays, Expert Briefing Tuesdays, Wellness Wednesdays, Take Time Thursdays, Fitness Fridays -Listings for May 2022 are on the website o Upcoming Webinar:  Newly Diagnosed Building a Better Life with Parkinson   Wednesday, May 18th @ 1 pm o Register for Armed forces operational officer) at [email or web address] o  Please check out their website to sign up for emails and see their full online offerings  . [place]:  [email or web address]  o Upcoming Webinar:   What's in your DNA, understanding Parkinson's genetics.  Thursday, May 19th @ 12 noon o Check out additional information on their website to see their full online offerings  . [place]:  [email or web address] o Upcoming Webinar:  Stay tuned o Care Partner Monthly Meetup.  With [place].  First Tuesday of each month, 2 pm o Joy Breaks:  First Wednesday of each month, 2-3 pm. There will be art, doodling, making, crafting, listening, laughing, stories, and everything in between. No art experience necessary. No supplies required. Just show up  for joy!  Register on their website. o Check out additional information to Live Well Today on their website  . Parkinson and Movement Disorders (PMD) Alliance:  www.pmdalliance.org o [employer] Online:  Online Education Events o Sign up for emails, which are sent weekly to give you  updates on programming and online offerings     . Parkinson's Association of the [place]:  www.[email or web address] o Information on online support groups, education events, and online exercises including Yoga, Parkinson's exercises and more-LOTS of information on links to PD resources and online events o Virtual Support Group through Parkinson's Association of the [place]; next one is scheduled for Wednesday, May 4th, 2022 at 2 pm. (These are typically scheduled for the 1st Wednesday of the month at 2 pm).  Visit website for details.  . Additional links for movement activities: o PWR! Moves Classes at [place] RESUMED!  Wednesdays 10 and 11 am.  Contact Amy Marriott, PT [email or web address] or [phone] if interested o Here is a link to the PWR!Moves classes on Zoom from [place] - Daily Mon-Sat at 10:00. Via Zoom, FREE and open to all.  There is also a link below via Facebook if you use that platform. - [email or web address].si - [email or web address] o Parkinson's Wellness Recovery (PWR! Moves)  www.[email or web address] - Info on the PWR! Virtual Experience:  You will have access to our expertise through self-assessment, guided plans that start with the PD-specific fundamentals, educational content, tips, Q&A with an expert, and a growing Art therapist of PD-specific pre-recorded and live exercise classes of varying types and intensity - both physical and cognitive! If that is not enough, we offer 1:1 wellness consultations (in-person or virtual) to personalize your PWR! Research scientist (medical).  - Check out the PWR! Move of the month on the [place] [facility] website:  [email or web address] o [employer] Fridays:  - As part  of the PD Health @ Home program, this free video series focuses each week on one aspect of fitness designed to support people living with Parkinson's.  These weekly videos highlight the [place] recent fitness guidelines for people with Parkinson's disease. -  [email or web address].dk o Dance for PD website is offering free, live-stream classes throughout the week, as well as links to [employer] of classes:  [email or web address] o Dance for Parkinson's Class:  [place].  Free offering for people with Parkinson's and care partners; virtual class.  o For more information, contact [phone] or email Ruffin Frederick at [email or web address] o Virtual dance and Pilates for Parkinson's classes: Click on the Community Tab> Parkinson's Movement Initiative Tab.  To register for classes and for more information, visit www.[employer] and click the "community" tab.     o [employer] Parkinson's Cycling Classes  - [facility]: 1pm on Fridays-Live classes at 05-06-1991 ([employer] at [email or web address] or [phone]) [phone] [employer]: Virtual Classes Mondays and Thursdays [birth year]1985 classes Tuesday, Wednesday and Thursday (contact [place] at [email or web address]  or [phone])  o [facility] Boxing - Three levels of classes are offered Tuesdays and Thursdays:  10:30 am,  12 noon & 1:45 pm at [facility].  - Active Stretching with [facility] Class starting in March, on Fridays - To observe a class or for  more information, call [phone] or email [email or web address] . [employer]: o [phone] Opportunities:  www.[email or web address]/caregiver-[email or web address]/[email or web address]-support-group.  You may also contact Chief Technology Officer at [email or web address] or [phone].   o [facility] Family  Caregivers! Thursday, May 12th 10:00a-1:30p [place]  [facility], [employer], [address] [place] . You may contact Vickki Muff at [email or web address] or [phone].   o [facility] Navigator:  Just1Navigator program, a free service to help individuals and families through the journey of determining care for older adults.  The "Navigator" is a [phone], Education officer, museum, who will speak with a prospective client and/or loved ones to provide an assessment of the situation and a set of recommendations for a personalized care plan -- all free of charge, and whether [facility] [employer] offers the needed service or not. If the need is not a service we provide, we are well-connected with reputable programs in town that we can refer you to.  www.well-[email or web address] or to speak with the Navigator, call [phone].

## 2021-03-16 DIAGNOSIS — M5432 Sciatica, left side: Secondary | ICD-10-CM | POA: Diagnosis not present

## 2021-03-16 DIAGNOSIS — M9903 Segmental and somatic dysfunction of lumbar region: Secondary | ICD-10-CM | POA: Diagnosis not present

## 2021-03-22 DIAGNOSIS — M5432 Sciatica, left side: Secondary | ICD-10-CM | POA: Diagnosis not present

## 2021-03-22 DIAGNOSIS — M9903 Segmental and somatic dysfunction of lumbar region: Secondary | ICD-10-CM | POA: Diagnosis not present

## 2021-05-18 DIAGNOSIS — D0461 Carcinoma in situ of skin of right upper limb, including shoulder: Secondary | ICD-10-CM | POA: Diagnosis not present

## 2021-05-18 DIAGNOSIS — L570 Actinic keratosis: Secondary | ICD-10-CM | POA: Diagnosis not present

## 2021-05-18 DIAGNOSIS — L821 Other seborrheic keratosis: Secondary | ICD-10-CM | POA: Diagnosis not present

## 2021-05-18 DIAGNOSIS — Z85828 Personal history of other malignant neoplasm of skin: Secondary | ICD-10-CM | POA: Diagnosis not present

## 2021-05-18 DIAGNOSIS — C44519 Basal cell carcinoma of skin of other part of trunk: Secondary | ICD-10-CM | POA: Diagnosis not present

## 2021-05-18 DIAGNOSIS — D1801 Hemangioma of skin and subcutaneous tissue: Secondary | ICD-10-CM | POA: Diagnosis not present

## 2021-05-18 DIAGNOSIS — L814 Other melanin hyperpigmentation: Secondary | ICD-10-CM | POA: Diagnosis not present

## 2021-06-08 DIAGNOSIS — Z7982 Long term (current) use of aspirin: Secondary | ICD-10-CM | POA: Diagnosis not present

## 2021-06-08 DIAGNOSIS — Z87891 Personal history of nicotine dependence: Secondary | ICD-10-CM | POA: Diagnosis not present

## 2021-06-08 DIAGNOSIS — N529 Male erectile dysfunction, unspecified: Secondary | ICD-10-CM | POA: Diagnosis not present

## 2021-06-08 DIAGNOSIS — E785 Hyperlipidemia, unspecified: Secondary | ICD-10-CM | POA: Diagnosis not present

## 2021-06-08 DIAGNOSIS — G20 Parkinson's disease: Secondary | ICD-10-CM | POA: Diagnosis not present

## 2021-06-08 DIAGNOSIS — G3184 Mild cognitive impairment, so stated: Secondary | ICD-10-CM | POA: Diagnosis not present

## 2021-08-31 ENCOUNTER — Other Ambulatory Visit: Payer: Self-pay | Admitting: Neurology

## 2021-08-31 DIAGNOSIS — G20 Parkinson's disease: Secondary | ICD-10-CM

## 2021-09-06 NOTE — Progress Notes (Signed)
Assessment/Plan:   1.  Parkinsons Disease  -Increase carbidopa/levodopa 25/100, 1 tablet at 6 AM/10 AM/2 PM/6 PM   -weaning off of pramipexole due to illusions  -start carbidopa/levodopa 50/200 at bed for first AM on  -We discussed that it used to be thought that levodopa would increase risk of melanoma but now it is believed that Parkinsons itself likely increases risk of melanoma. Jeremy Davidson is to get regular skin checks.  Jeremy Davidson went about 6 months ago.    2.  Insomnia  -no napping after 2 pm  -add melatonn, 3 mg at bedtime  3.  Constipation  -discussed nature and pathophysiology and association with PD  -discussed importance of hydration.  Pt is to increase water intake  -pt is given a copy of the rancho recipe  -recommended daily colace  -recommended miralax prn   4.  Memory change  -will stop pramipexole  -discussed physical and mental exercises  Subjective:   Jeremy Davidson was seen today in follow up for Parkinsons disease.  My previous records were reviewed prior to todays visit as well as outside records available to me. Daughter supplements the history.   Last visit, we talked about how to take the levodopa properly and I also slightly decreased his pramipexole because of illusions.  Jeremy Davidson reports that Jeremy Davidson is still having some illusions.  Jeremy Davidson has no visual hallucination but sometimes has a feeling someone else in the house.  Jeremy Davidson has a Physiological scientist 3 days a week, walks daily, and does boxing.  Jeremy Davidson has completed the [facility] study.  Jeremy Davidson does have some trouble with first AM on.  Has some insomnia.  Naps during day but only 15 min.  Also c/o constipation  Current prescribed movement disorder medications: Carbidopa/levodopa 25/100, 1 tablet at 6 AM/10 AM/2 PM/6 PM Pramipexole 0.5 mg, 1 tablet / 1 tablet/0.5 tablet   PREVIOUS MEDICATIONS: Sinemet and Mirapex  ALLERGIES:  No Known Allergies  CURRENT MEDICATIONS:  Outpatient Encounter Medications as of 09/14/2021  Medication Sig    atorvastatin (LIPITOR) 40 MG tablet Take 40 mg by mouth at bedtime.   carbidopa-levodopa (SINEMET IR) 25-100 MG tablet Take 1 tablet by mouth 4 (four) times daily. 6am/10am/2pm/6pm   pramipexole (MIRAPEX) 0.5 MG tablet 1 IN THE AM, 1 IN THE AFTERNOON, 1/2 IN THE EVENING   [DISCONTINUED] Ascorbic Acid (VITAMIN C) 1000 MG tablet Take 1,000 mg by mouth daily. (Patient not taking: Reported on 10/02/2020)   [DISCONTINUED] Magnesium 250 MG TABS Take 1 tablet by mouth daily. (Patient not taking: Reported on 10/02/2020)   No facility-administered encounter medications on file as of 09/14/2021.    Objective:   PHYSICAL EXAMINATION:    VITALS:   There were no vitals filed for this visit.   GEN:  The patient appears stated age and is in NAD. HEENT:  Normocephalic, atraumatic.  The mucous membranes are moist. The superficial temporal arteries are without ropiness or tenderness. CV:  RRR Lungs:  CTAB Neck/HEME:  There are no carotid bruits bilaterally.  Neurological examination:  Orientation: The patient is alert and oriented x3. Cranial nerves: There is good facial symmetry with facial hypomimia. The speech is fluent and clear. Soft palate rises symmetrically and there is no tongue deviation. Hearing is intact to conversational tone. Sensation: Sensation is intact to light touch throughout Motor: Strength is at least antigravity x4.  Movement examination: Tone: There is mild increased tone in the RUE Abnormal movements: LUE rest tremor with ambulation Coordination:  There  is mild decremation with RAM's, with any form of RAMS, including alternating supination and pronation of the forearm, hand opening and closing, finger taps, heel taps and toe taps., L>R Gait and Station: The patient has no difficulty arising out of a deep-seated chair without the use of the hands.  Jeremy Davidson has decreased arm swing on the L    Total time spent on today's visit was 31 minutes, including both face-to-face time and  nonface-to-face time.  Time included that spent on review of records (prior notes available to me/labs/imaging if pertinent), discussing treatment and goals, answering patient's questions and coordinating care.  Cc:  Lujean Amel, MD

## 2021-09-14 ENCOUNTER — Encounter: Payer: Self-pay | Admitting: Neurology

## 2021-09-14 ENCOUNTER — Ambulatory Visit: Payer: Medicare HMO | Admitting: Neurology

## 2021-09-14 ENCOUNTER — Other Ambulatory Visit: Payer: Self-pay

## 2021-09-14 VITALS — BP 126/79 | HR 62 | Ht 72.0 in | Wt 192.2 lb

## 2021-09-14 DIAGNOSIS — G4709 Other insomnia: Secondary | ICD-10-CM

## 2021-09-14 DIAGNOSIS — K5901 Slow transit constipation: Secondary | ICD-10-CM

## 2021-09-14 DIAGNOSIS — G20 Parkinson's disease: Secondary | ICD-10-CM | POA: Diagnosis not present

## 2021-09-14 MED ORDER — CARBIDOPA-LEVODOPA 25-100 MG PO TABS: 1.0000 | ORAL_TABLET | Freq: Four times a day (QID) | ORAL | 1 refills | Status: DC

## 2021-09-14 MED ORDER — CARBIDOPA-LEVODOPA ER 50-200 MG PO TBCR: 1.0000 | EXTENDED_RELEASE_TABLET | Freq: Every day | ORAL | 1 refills | Status: DC

## 2021-09-14 NOTE — Patient Instructions (Addendum)
Week 1: Take pramipexole 0.5, 1/2 tablet three times per day Continue carbidopa/levodopa, 1 at 6am/10am/2pm/6pm Start carbidopa/levodopa 50/200 CR at beditme  Week 2: Take pramipexole, 1/2 tablet twice per day Continue carbidopa/levodopa, 1 at 6am/10am/2pm/6pm Continue carbidopa/levodopa 50/200 at bed  Week 3 Take pramipexole 1/2 tablet twice per day Continue carbidopa/levodopa, 1 at 6am/10am/2pm/6pm Continue carbidopa/levodopa 50/200 at bed  Week 4 and beyond: STOP pramipexole Continue carbidopa/levodopa, 1 at 6am/10am/2pm/6pm Continue carbidopa/levodopa 50/200 at bed  You can add melatonin, 3 mg at bedtime No naps after 2pm  Constipation and Parkinson's disease:  1.Rancho recipe for constipation in Parkinsons Disease:  -1 cup of unprocessed bran (need to get this at [employer], [employer] or similar type of store), 2 cups of applesauce in 1 cup of prune juice 2.  Increase fiber intake (Metamucil,vegetables) 3.  Regular, moderate exercise can be beneficial. 4.  Avoid medications causing constipation, such as medications like antacids with calcium or magnesium 5.  It's okay to take daily Miralax, and taper if stools become too loose or you experience diarrhea 6.  Stool softeners (Colace) can help with chronic constipation and I recommend you take this daily. 7.  Increase water intake.  You should be drinking 1/2 gallon of water a day as long as you have not been diagnosed with congestive heart failure or renal/kidney failure.  This is probably the single greatest thing that you can do to help your constipation.

## 2021-09-21 DIAGNOSIS — M9903 Segmental and somatic dysfunction of lumbar region: Secondary | ICD-10-CM | POA: Diagnosis not present

## 2021-09-21 DIAGNOSIS — M5432 Sciatica, left side: Secondary | ICD-10-CM | POA: Diagnosis not present

## 2021-09-23 DIAGNOSIS — M5432 Sciatica, left side: Secondary | ICD-10-CM | POA: Diagnosis not present

## 2021-09-23 DIAGNOSIS — M9903 Segmental and somatic dysfunction of lumbar region: Secondary | ICD-10-CM | POA: Diagnosis not present

## 2021-09-28 DIAGNOSIS — M5432 Sciatica, left side: Secondary | ICD-10-CM | POA: Diagnosis not present

## 2021-09-28 DIAGNOSIS — M9903 Segmental and somatic dysfunction of lumbar region: Secondary | ICD-10-CM | POA: Diagnosis not present

## 2021-10-04 DIAGNOSIS — M5432 Sciatica, left side: Secondary | ICD-10-CM | POA: Diagnosis not present

## 2021-10-04 DIAGNOSIS — M9903 Segmental and somatic dysfunction of lumbar region: Secondary | ICD-10-CM | POA: Diagnosis not present

## 2021-10-21 DIAGNOSIS — N400 Enlarged prostate without lower urinary tract symptoms: Secondary | ICD-10-CM | POA: Diagnosis not present

## 2021-11-04 ENCOUNTER — Telehealth: Payer: Self-pay | Admitting: Neurology

## 2021-11-04 NOTE — Telephone Encounter (Signed)
Patients tremors are getting worse, he would like to see about trying ropinirole.

## 2021-11-04 NOTE — Telephone Encounter (Signed)
Pt c/o increasing tremor Current tremor meds AND times they are taken carbidopa-levodopa 50-200 Take 1 tablet by mouth at bedtime carbidopa-levodopa 25-100 1 tablet by mouth 4 (four) times daily. 6am/10am/2pm/6pm Pts next appointment: 03/17/2022  Tremors happen more often, a friend takes ropinirole for tremors and it works for his friend he is asking if it works for him it might work for him

## 2021-11-04 NOTE — Telephone Encounter (Signed)
Pt called an informed No.  We just d/c his pramipexole b/c of memory change and ropinirole is basically the same drug as ropinirole. Pt verbalized understanding,

## 2021-11-16 DIAGNOSIS — R351 Nocturia: Secondary | ICD-10-CM | POA: Diagnosis not present

## 2021-11-16 DIAGNOSIS — N401 Enlarged prostate with lower urinary tract symptoms: Secondary | ICD-10-CM | POA: Diagnosis not present

## 2021-11-16 DIAGNOSIS — R3912 Poor urinary stream: Secondary | ICD-10-CM | POA: Diagnosis not present

## 2021-11-16 DIAGNOSIS — Z125 Encounter for screening for malignant neoplasm of prostate: Secondary | ICD-10-CM | POA: Diagnosis not present

## 2021-11-22 DIAGNOSIS — L814 Other melanin hyperpigmentation: Secondary | ICD-10-CM | POA: Diagnosis not present

## 2021-11-22 DIAGNOSIS — L570 Actinic keratosis: Secondary | ICD-10-CM | POA: Diagnosis not present

## 2021-11-22 DIAGNOSIS — L821 Other seborrheic keratosis: Secondary | ICD-10-CM | POA: Diagnosis not present

## 2021-11-22 DIAGNOSIS — C44629 Squamous cell carcinoma of skin of left upper limb, including shoulder: Secondary | ICD-10-CM | POA: Diagnosis not present

## 2021-11-22 DIAGNOSIS — Z85828 Personal history of other malignant neoplasm of skin: Secondary | ICD-10-CM | POA: Diagnosis not present

## 2021-12-24 ENCOUNTER — Encounter: Payer: Self-pay | Admitting: Neurology

## 2021-12-27 DIAGNOSIS — N3281 Overactive bladder: Secondary | ICD-10-CM | POA: Diagnosis not present

## 2021-12-27 DIAGNOSIS — N401 Enlarged prostate with lower urinary tract symptoms: Secondary | ICD-10-CM | POA: Diagnosis not present

## 2021-12-27 DIAGNOSIS — R351 Nocturia: Secondary | ICD-10-CM | POA: Diagnosis not present

## 2022-01-04 ENCOUNTER — Encounter: Payer: Self-pay | Admitting: Neurology

## 2022-01-06 DIAGNOSIS — M6281 Muscle weakness (generalized): Secondary | ICD-10-CM | POA: Diagnosis not present

## 2022-01-06 DIAGNOSIS — R3912 Poor urinary stream: Secondary | ICD-10-CM | POA: Diagnosis not present

## 2022-01-06 DIAGNOSIS — M6289 Other specified disorders of muscle: Secondary | ICD-10-CM | POA: Diagnosis not present

## 2022-01-06 DIAGNOSIS — M62838 Other muscle spasm: Secondary | ICD-10-CM | POA: Diagnosis not present

## 2022-01-06 DIAGNOSIS — R351 Nocturia: Secondary | ICD-10-CM | POA: Diagnosis not present

## 2022-01-06 DIAGNOSIS — N3281 Overactive bladder: Secondary | ICD-10-CM | POA: Diagnosis not present

## 2022-01-15 ENCOUNTER — Encounter: Payer: Self-pay | Admitting: Neurology

## 2022-01-18 DIAGNOSIS — L570 Actinic keratosis: Secondary | ICD-10-CM | POA: Diagnosis not present

## 2022-01-20 DIAGNOSIS — R3912 Poor urinary stream: Secondary | ICD-10-CM | POA: Diagnosis not present

## 2022-01-20 DIAGNOSIS — N3281 Overactive bladder: Secondary | ICD-10-CM | POA: Diagnosis not present

## 2022-01-20 DIAGNOSIS — M6281 Muscle weakness (generalized): Secondary | ICD-10-CM | POA: Diagnosis not present

## 2022-01-20 DIAGNOSIS — M62838 Other muscle spasm: Secondary | ICD-10-CM | POA: Diagnosis not present

## 2022-01-20 DIAGNOSIS — M6289 Other specified disorders of muscle: Secondary | ICD-10-CM | POA: Diagnosis not present

## 2022-02-09 DIAGNOSIS — H02834 Dermatochalasis of left upper eyelid: Secondary | ICD-10-CM | POA: Diagnosis not present

## 2022-02-09 DIAGNOSIS — H2513 Age-related nuclear cataract, bilateral: Secondary | ICD-10-CM | POA: Diagnosis not present

## 2022-02-09 DIAGNOSIS — H02831 Dermatochalasis of right upper eyelid: Secondary | ICD-10-CM | POA: Diagnosis not present

## 2022-02-09 DIAGNOSIS — H04123 Dry eye syndrome of bilateral lacrimal glands: Secondary | ICD-10-CM | POA: Diagnosis not present

## 2022-02-09 DIAGNOSIS — H5052 Exophoria: Secondary | ICD-10-CM | POA: Diagnosis not present

## 2022-02-14 ENCOUNTER — Encounter: Payer: Self-pay | Admitting: Neurology

## 2022-02-14 DIAGNOSIS — R351 Nocturia: Secondary | ICD-10-CM | POA: Diagnosis not present

## 2022-02-14 DIAGNOSIS — N401 Enlarged prostate with lower urinary tract symptoms: Secondary | ICD-10-CM | POA: Diagnosis not present

## 2022-02-14 DIAGNOSIS — N3281 Overactive bladder: Secondary | ICD-10-CM | POA: Diagnosis not present

## 2022-03-09 ENCOUNTER — Encounter: Payer: Self-pay | Admitting: Neurology

## 2022-03-16 ENCOUNTER — Other Ambulatory Visit: Payer: Self-pay | Admitting: Neurology

## 2022-03-16 NOTE — Progress Notes (Unsigned)
Assessment/Plan:   1.  Parkinsons Disease  -Continue carbidopa/levodopa 25/100, 1 tablet at 6 AM/10 AM/2 PM/6 PM   -continue carbidopa/levodopa 50/200 at bed for first AM on  -We discussed that it used to be thought that levodopa would increase risk of melanoma but now it is believed that Parkinsons itself likely increases risk of melanoma. he is to get regular skin checks.  He went about 6 months ago.    2.  Insomnia  -no napping after 2 pm  -add melatonn, 3 mg at bedtime  3.  Constipation  -discussed nature and pathophysiology and association with PD  -discussed importance of hydration.  Pt is to increase water intake  -pt is given a copy of the rancho recipe  -recommended daily colace  -recommended miralax prn   4.  Memory change  -Doing mental and physical exercises  5.  Depression  -add lexapro 10 mg daily  6.  Fatigue  -may be related to #5 but discussed that fatigue is the #1 treatment resistant complaint of Parkinson's patients.  Subjective:   Jeremy Davidson was seen today in follow up for Parkinsons disease.  My previous records were reviewed prior to todays visit as well as outside records available to me.  Wife supplements history.   Last visit, we weaned the patient off of pramipexole because of illusions.  He is no longer having that.  He remains on levodopa and bedtime levodopa was added for first morning on.  He emailed me about a month ago about depression, and we asked them to follow-up with primary care about this. He has reached out but didn't get a response back.  His wife states that he is a bit better in that regard, but they still would like some treatment.  They have an appt in a month.  Pt is exercising with personal trainer and doing boxing.  He is not falling.  Biggest c/o is fatigue  Current prescribed movement disorder medications: Carbidopa/levodopa 25/100, 1 tablet at 6 AM/10 AM/2 PM/6 PM Carbidopa/levodopa 50/200 CR at bedtime (started last  visit for first morning on).   PREVIOUS MEDICATIONS: Sinemet and Mirapex (weaned off because of delusions)  ALLERGIES:  No Known Allergies  CURRENT MEDICATIONS:  Outpatient Encounter Medications as of 03/17/2022  Medication Sig   atorvastatin (LIPITOR) 40 MG tablet Take 40 mg by mouth at bedtime.   carbidopa-levodopa (SINEMET CR) 50-200 MG tablet Take 1 tablet by mouth at bedtime.   carbidopa-levodopa (SINEMET IR) 25-100 MG tablet Take 1 tablet by mouth 4 (four) times daily. 6am/10am/2pm/6pm   docusate sodium (STOOL SOFTENER) 100 MG capsule Take 100 mg by mouth 2 (two) times daily.   GEMTESA 75 MG TABS Take 1 tablet by mouth daily.   Multiple Vitamin (MULTIVITAMIN) tablet Take 1 tablet by mouth daily.   tamsulosin (FLOMAX) 0.4 MG CAPS capsule Take 0.4 mg by mouth daily.   No facility-administered encounter medications on file as of 03/17/2022.    Objective:   PHYSICAL EXAMINATION:    VITALS:   Vitals:   03/17/22 1453  BP: (!) 175/90  Pulse: 78  SpO2: 99%  Weight: 179 lb (81.2 kg)  Height: 6' (1.829 m)     GEN:  The patient appears stated age and is in NAD. HEENT:  Normocephalic, atraumatic.  The mucous membranes are moist. The superficial temporal arteries are without ropiness or tenderness. CV:  RRR Lungs:  CTAB Neck/HEME:  There are no carotid bruits bilaterally.  Neurological examination:  Orientation: The patient is alert and oriented x3. Cranial nerves: There is good facial symmetry with facial hypomimia. The speech is fluent and clear. Soft palate rises symmetrically and there is no tongue deviation. Hearing is intact to conversational tone. Sensation: Sensation is intact to light touch throughout Motor: Strength is at least antigravity x4.  Movement examination: Tone: There is mild increased tone in the bilateral UE (he is due for medication) Abnormal movements: LUE rest tremor with ambulation Coordination:  There is mild decremation with RAM's, with any form of  RAMS, including alternating supination and pronation of the forearm, hand opening and closing, finger taps, heel taps and toe taps., L>R Gait and Station: The patient has no difficulty arising out of a deep-seated chair without the use of the hands.      Total time spent on today's visit was 38 minutes, including both face-to-face time and nonface-to-face time.  Time included that spent on review of records (prior notes available to me/labs/imaging if pertinent), discussing treatment and goals, answering patient's questions and coordinating care.  Cc:  Lujean Amel, MD

## 2022-03-17 ENCOUNTER — Ambulatory Visit: Payer: Medicare HMO | Admitting: Neurology

## 2022-03-17 ENCOUNTER — Encounter: Payer: Self-pay | Admitting: Neurology

## 2022-03-17 VITALS — BP 175/90 | HR 78 | Ht 72.0 in | Wt 179.0 lb

## 2022-03-17 DIAGNOSIS — F330 Major depressive disorder, recurrent, mild: Secondary | ICD-10-CM

## 2022-03-17 DIAGNOSIS — G20 Parkinson's disease: Secondary | ICD-10-CM

## 2022-03-17 DIAGNOSIS — R69 Illness, unspecified: Secondary | ICD-10-CM | POA: Diagnosis not present

## 2022-03-17 MED ORDER — ESCITALOPRAM OXALATE 10 MG PO TABS: 10.0000 mg | ORAL_TABLET | Freq: Every day | ORAL | 1 refills | Status: DC

## 2022-03-17 NOTE — Patient Instructions (Signed)
Local and Online Resources for Power over Parkinson's Group May 2023  LOCAL [place] PARKINSON'S GROUPS  Power over Parkinson's Group:   Power Over Parkinson's Patient Education Group will be Wednesday, May 10th-*Hybrid meting*- in person at [place] location and via [facility] at 2:00 pm.   Upcoming Power over Parkinson's Meetings:  2nd Wednesdays of the month at 2 pm:  May 10th, June 14th, July 12th Contact Amy Marriott at [email or web address] if interested in participating in this group Parkinson's Care Partners Group:    3rd Mondays, Contact Misty Paladino Atypical Parkinsonian Patient Group:   4th Wednesdays, [place] If you are interested in participating in these groups with Misty, please contact her directly for how to join those meetings.  Her contact information is [email or web address].    LOCAL EVENTS AND NEW OFFERINGS Moving Day [place]:  Saturday, May 6th, 9:30 am at [place], [place], [place]. Participate in Moving Day as a way to "honor loved ones, raise funds, fight Parkinson's disease, and celebrate movement."  Register today at [email or web address]!  Play [place]!  Join [place] for home game for a fun evening to bring awareness of Parkinson's and raise funds for our Movement Disorder Funds. Rescheduled to May 11th  6:30 pm [place]. To purchase tickets:  [email or web address] Parkinson's T-shirts for sale!  Designed by a local group member, with funds going to [place].  $25.00  Investment banker, corporate to purchase  [employer]! Moves [employer] Instructor-Led Class offering at [employer]!  Wednesdays 1-2 pm, starting April 12th.   Contact Bryson Dames, Acupuncturist at [employer].  [email or web address]  [place]:  [email or web address] PD Health at Home continues:  Mindfulness  Mondays, Wellness Wednesdays, Fitness Fridays  Upcoming Education:  Understanding Gene and Cell-Based Therapies in Parkinson's.  Wednesday, May 10th at 1:00 pm Additional Education offerings virtually through their website-upcoming topics include Palliative Care/Hospice and PD, Sleep and PD Register for expert briefings (webinars) at [email or web address] Please check out their website to sign up for emails and see their full online offerings   [place]:  [email or web address]  Third Thursday Webinars:  On the third Thursday of every month at 12 p.m. ET, join our free live webinars to learn about various aspects of living with Parkinson's disease and our work to speed medical breakthroughs. Upcoming Webinar: Get Moving: Exercising for a Healthy Brain.  Thursday, May 18th  at  12 noon. Check out additional information on their website to see their full online offerings  [facility]:  [email or web address] Upcoming Webinar:   Stay tuned Webinar Series:  Living with Parkinson's Meetup.   Third Thursdays each month, 3 pm Care Partner Monthly Meetup.  With Robin Searing [place].  First Tuesday of each month, 2 pm Check out additional information to Live Well Today on their website  Parkinson and Movement Disorders (PMD) Alliance:  www.[email or web address] [employer] Online:  Online Education Events Sign up for emails, which are sent weekly to give you updates on programming and online offerings  Parkinson's Association of the [place]:  www.[email or web address] Information on online support groups, education events, and online exercises including Yoga, Parkinson's exercises and more-LOTS of information on links to PD resources and online events Virtual Support Group through Parkinson's Association of the [place]; next one is scheduled for Wednesday, May 3rd at 2 pm. (These are typically  scheduled for the 1st Wednesday of the month at 2 pm).  Visit website for details. MOVEMENT AND  EXERCISE OPPORTUNITIES Parkinson's DRUMMING Classes/Music Therapy with Doylene Canning:  This is a returning class and it's FREE!  2nd Mondays, continuing May 8th, 11:00 at the [place].  Contact *Misty Taylor-Paladino at [email or web address] or Doylene Canning at [phone] or [email or web address]  PWR! Moves Classes at [place].  Wednesdays 10 and 11 am.   Contact Amy Marriott, PT [email or web address] if interested. NEW PWR! Moves Class offering at [employer].  Wednesdays 1-2 pm, starting April 12th.  Contact Bryson Dames, Acupuncturist at [employer].  [email or web address] Here is a link to the PWR!Moves classes on Zoom from [place] - Daily Mon-Sat at 10:00. Via Zoom, FREE and open to all.  There is also a link below via Facebook if you use that platform.  [email or web address]  Parkinson's Wellness Recovery (PWR! Moves)  www.[email or web address] Info on the PWR! Virtual Experience:  You will have access to our expertise through self-assessment, guided plans that start with the PD-specific fundamentals, educational content, tips, Q&A with an expert, and a growing Art therapist of PD-specific pre-recorded and live exercise classes of varying types and intensity - both physical and cognitive! If that is not enough, we offer 1:1 wellness consultations (in-person or virtual) to personalize your PWR! Research scientist (medical).  [place] Fridays:  As part of the PD Health @ Home program, this free video series focuses each week on one aspect of fitness designed to support people living with  Parkinson's.  These weekly videos highlight the [place] recent fitness guidelines for people with Parkinson's disease. ModemGamers.si Dance for PD website is offering free, live-stream classes throughout the week, as well as links to [employer] of classes:  [email or web address]/ Virtual dance and Pilates for Parkinson's classes: Click on the Community Tab> Parkinson's Movement Initiative Tab.  To register for classes and for more information, visit [email or web address] and click the "community" tab.  [employer] Parkinson's Cycling Classes  [employer]:  Thursdays @ Noon-Live classes at [employer] ([employer] at [email or web address] or [phone]) [employer]: Virtual Classes Mondays and Thursdays Jeanette Caprice classes Tuesday, Wednesday and Thursday (contact [place] at [email or web address]  or [phone]) [place] Varied levels of classes are offered Mondays, Tuesdays and Thursdays at [employer].  Stretching with Verdis Frederickson weekly class is also offered for people with Parkinson's To observe a class or for more information, call [phone] or email Hezzie Bump at [email or web address] ADDITIONAL SUPPORT AND RESOURCES [employer]:Online Caregiver Education Opportunities:  www.well-springsolutions.org/caregiver-education/caregiver-support-group.  You may also contact Vickki Muff at [email or web address] or [phone].    Well-Spring Navigator:  [phone] program, a free service to help individuals and families through the journey of determining care for older adults.  The "Navigator" is a [employer], Education officer, museum, who will speak with a prospective client and/or loved ones to provide an assessment of the situation and a set of recommendations for a personalized care plan -- all free of charge, and whether [facility] [employer]  offers the needed service or not. If the need is not a service we provide, we are well-connected with reputable programs in town that we can refer you to.  www.well-[email or web address] or to speak with the Navigator, call [phone].

## 2022-04-12 ENCOUNTER — Ambulatory Visit: Payer: Medicare HMO | Admitting: Neurology

## 2022-04-14 DIAGNOSIS — G20 Parkinson's disease: Secondary | ICD-10-CM | POA: Diagnosis not present

## 2022-04-14 DIAGNOSIS — Z79899 Other long term (current) drug therapy: Secondary | ICD-10-CM | POA: Diagnosis not present

## 2022-04-14 DIAGNOSIS — Z0000 Encounter for general adult medical examination without abnormal findings: Secondary | ICD-10-CM | POA: Diagnosis not present

## 2022-04-14 DIAGNOSIS — E7800 Pure hypercholesterolemia, unspecified: Secondary | ICD-10-CM | POA: Diagnosis not present

## 2022-05-22 ENCOUNTER — Other Ambulatory Visit: Payer: Self-pay

## 2022-05-22 ENCOUNTER — Observation Stay (HOSPITAL_BASED_OUTPATIENT_CLINIC_OR_DEPARTMENT_OTHER)
Admission: EM | Admit: 2022-05-22 | Discharge: 2022-05-23 | Disposition: A | Discharge status: Home Health | Payer: Medicare HMO | Attending: Internal Medicine | Admitting: Internal Medicine

## 2022-05-22 ENCOUNTER — Emergency Department (HOSPITAL_BASED_OUTPATIENT_CLINIC_OR_DEPARTMENT_OTHER): Payer: Medicare HMO

## 2022-05-22 ENCOUNTER — Encounter (HOSPITAL_BASED_OUTPATIENT_CLINIC_OR_DEPARTMENT_OTHER): Payer: Self-pay

## 2022-05-22 DIAGNOSIS — R262 Difficulty in walking, not elsewhere classified: Secondary | ICD-10-CM

## 2022-05-22 DIAGNOSIS — Z79899 Other long term (current) drug therapy: Secondary | ICD-10-CM | POA: Insufficient documentation

## 2022-05-22 DIAGNOSIS — I10 Essential (primary) hypertension: Secondary | ICD-10-CM | POA: Diagnosis present

## 2022-05-22 DIAGNOSIS — R2681 Unsteadiness on feet: Secondary | ICD-10-CM

## 2022-05-22 DIAGNOSIS — R509 Fever, unspecified: Secondary | ICD-10-CM | POA: Insufficient documentation

## 2022-05-22 DIAGNOSIS — E871 Hypo-osmolality and hyponatremia: Secondary | ICD-10-CM | POA: Diagnosis not present

## 2022-05-22 DIAGNOSIS — G20 Parkinson's disease: Secondary | ICD-10-CM | POA: Diagnosis not present

## 2022-05-22 DIAGNOSIS — J069 Acute upper respiratory infection, unspecified: Principal | ICD-10-CM | POA: Diagnosis present

## 2022-05-22 DIAGNOSIS — R059 Cough, unspecified: Secondary | ICD-10-CM | POA: Diagnosis not present

## 2022-05-22 DIAGNOSIS — E785 Hyperlipidemia, unspecified: Secondary | ICD-10-CM | POA: Diagnosis present

## 2022-05-22 DIAGNOSIS — R2689 Other abnormalities of gait and mobility: Secondary | ICD-10-CM | POA: Insufficient documentation

## 2022-05-22 DIAGNOSIS — B348 Other viral infections of unspecified site: Secondary | ICD-10-CM | POA: Diagnosis not present

## 2022-05-22 DIAGNOSIS — Z20822 Contact with and (suspected) exposure to covid-19: Secondary | ICD-10-CM | POA: Insufficient documentation

## 2022-05-22 DIAGNOSIS — G9341 Metabolic encephalopathy: Secondary | ICD-10-CM | POA: Diagnosis not present

## 2022-05-22 DIAGNOSIS — R410 Disorientation, unspecified: Secondary | ICD-10-CM

## 2022-05-22 DIAGNOSIS — G20A1 Parkinson's disease without dyskinesia, without mention of fluctuations: Secondary | ICD-10-CM | POA: Diagnosis present

## 2022-05-22 DIAGNOSIS — Z87891 Personal history of nicotine dependence: Secondary | ICD-10-CM | POA: Insufficient documentation

## 2022-05-22 DIAGNOSIS — R4182 Altered mental status, unspecified: Secondary | ICD-10-CM | POA: Diagnosis not present

## 2022-05-22 DIAGNOSIS — R42 Dizziness and giddiness: Secondary | ICD-10-CM | POA: Diagnosis not present

## 2022-05-22 LAB — COMPREHENSIVE METABOLIC PANEL
ALT: <5 U/L (ref 0–44)
AST: 13 U/L — ABNORMAL LOW (ref 15–41)
Albumin: 4.2 g/dL (ref 3.5–5.0)
Alkaline Phosphatase: 34 U/L — ABNORMAL LOW (ref 38–126)
Anion gap: 9 (ref 5–15)
BUN: 11 mg/dL (ref 8–23)
CO2: 26 mmol/L (ref 22–32)
Calcium: 8.6 mg/dL — ABNORMAL LOW (ref 8.9–10.3)
Chloride: 96 mmol/L — ABNORMAL LOW (ref 98–111)
Creatinine, Ser: 0.93 mg/dL (ref 0.61–1.24)
GFR, Estimated: >60 mL/min (ref >60)
Glucose, Bld: 120 mg/dL — ABNORMAL HIGH (ref 70–99)
Potassium: 3.9 mmol/L (ref 3.5–5.1)
Sodium: 131 mmol/L — ABNORMAL LOW (ref 135–145)
Total Bilirubin: 0.7 mg/dL (ref 0.3–1.2)
Total Protein: 6.8 g/dL (ref 6.5–8.1)

## 2022-05-22 LAB — CBC
HCT: 39.2 % (ref 39.0–52.0)
Hemoglobin: 13.6 g/dL (ref 13.0–17.0)
MCH: 31.9 pg (ref 26.0–34.0)
MCHC: 34.7 g/dL (ref 30.0–36.0)
MCV: 92 fL (ref 80.0–100.0)
Platelets: 202 10*3/uL (ref 150–400)
RBC: 4.26 MIL/uL (ref 4.22–5.81)
RDW: 12.8 % (ref 11.5–15.5)
WBC: 10.3 10*3/uL (ref 4.0–10.5)
nRBC: 0 % (ref 0.0–0.2)

## 2022-05-22 LAB — URINALYSIS, ROUTINE W REFLEX MICROSCOPIC
Bilirubin Urine: NEGATIVE
Glucose, UA: NEGATIVE mg/dL
Hgb urine dipstick: NEGATIVE
Ketones, ur: NEGATIVE mg/dL
Leukocytes,Ua: NEGATIVE
Nitrite: NEGATIVE
Protein, ur: NEGATIVE mg/dL
Specific Gravity, Urine: 1.012 (ref 1.005–1.030)
pH: 7.5 (ref 5.0–8.0)

## 2022-05-22 LAB — GROUP A STREP BY PCR: Group A Strep by PCR: NOT DETECTED

## 2022-05-22 LAB — SARS CORONAVIRUS 2 BY RT PCR: SARS Coronavirus 2 by RT PCR: NEGATIVE

## 2022-05-22 LAB — CBG MONITORING, ED: Glucose-Capillary: 110 mg/dL — ABNORMAL HIGH (ref 70–99)

## 2022-05-22 MED ORDER — ACETAMINOPHEN 325 MG PO TABS
650.0000 mg | ORAL_TABLET | Freq: Once | ORAL | Status: AC
  Administered 2022-05-22: 650 mg via ORAL
  Filled 2022-05-22: qty 2

## 2022-05-22 MED ORDER — KETOROLAC TROMETHAMINE 15 MG/ML IJ SOLN
15.0000 mg | Freq: Once | INTRAMUSCULAR | Status: AC
  Administered 2022-05-22: 15 mg via INTRAVENOUS
  Filled 2022-05-22: qty 1

## 2022-05-22 MED ORDER — CARBIDOPA-LEVODOPA ER 25-100 MG PO TBCR
1.0000 | EXTENDED_RELEASE_TABLET | Freq: Every day | ORAL | Status: DC
  Administered 2022-05-23: 1 via ORAL
  Filled 2022-05-22 (×2): qty 1

## 2022-05-22 NOTE — ED Provider Notes (Addendum)
[facility] EMERGENCY DEPT Provider Note   CSN: [id number] Arrival date & time: 05/22/22  1813     History  Chief Complaint  Patient presents with   Dizziness   Altered Mental Status    Jeremy Davidson is a 71 y.o. male.  Patient is a 71 year old male with past medical history of Parkinson's disease presenting for altered mental status.  Patient is wife state his Parkinson's disease is well controlled at the house.  States he normally does not have significant confusion and his tremors are well controlled with his carbidopa levodopa doses.  Patient admits to episodes of dizziness and confusion that began in the middle the night last night.  Patient states he was unsure where he was and took several minutes to reorient himself.  Patient currently has a fever on arrival 100.8 F.  Admits to throat pain and a slight cough.  Denies any nasal congestion, postnasal drip, headache, neck stiffness, productive sputum, nausea, vomiting, abdominal pain, or diarrhea.  Denies any dysuria or increased frequency.    Patient states that his baseline he is normally very active and works out 5-6 times a week.  The history is provided by the patient.  Dizziness Associated symptoms: no chest pain, no palpitations, no shortness of breath and no vomiting   Altered Mental Status Presenting symptoms: confusion   Associated symptoms: fever   Associated symptoms: no abdominal pain, no palpitations, no rash, no seizures and no vomiting        Home Medications Prior to Admission medications   Medication Sig Start Date End Date Taking? Authorizing Provider  atorvastatin (LIPITOR) 40 MG tablet Take 40 mg by mouth at bedtime.    [provider]  carbidopa-levodopa (SINEMET CR) 50-200 MG tablet TAKE 1 TABLET BY MOUTH EVERYDAY AT BEDTIME 03/17/22   Tat, Rebecca S, DO  carbidopa-levodopa (SINEMET IR) 25-100 MG tablet Take 1 tablet by mouth 4 (four) times daily. 6am/10am/2pm/6pm 09/14/21    Tat, Eustace Quail, DO  docusate sodium (STOOL SOFTENER) 100 MG capsule Take 100 mg by mouth 2 (two) times daily.    [provider]  escitalopram (LEXAPRO) 10 MG tablet Take 1 tablet (10 mg total) by mouth daily. 03/17/22   Tat, Eustace Quail, DO  GEMTESA 75 MG TABS Take 1 tablet by mouth daily. 01/03/22   [provider]  Multiple Vitamin (MULTIVITAMIN) tablet Take 1 tablet by mouth daily.    [provider]  tamsulosin (FLOMAX) 0.4 MG CAPS capsule Take 0.4 mg by mouth daily. 02/11/22   [provider]      Allergies    Patient has no known allergies.    Review of Systems   Review of Systems  Constitutional:  Positive for fever. Negative for chills.  HENT:  Positive for sore throat. Negative for ear pain.   Eyes:  Negative for pain and visual disturbance.  Respiratory:  Positive for cough. Negative for shortness of breath.   Cardiovascular:  Negative for chest pain and palpitations.  Gastrointestinal:  Negative for abdominal pain and vomiting.  Genitourinary:  Negative for dysuria and hematuria.  Musculoskeletal:  Negative for arthralgias and back pain.  Skin:  Negative for color change and rash.  Neurological:  Positive for dizziness. Negative for seizures and syncope.  Psychiatric/Behavioral:  Positive for confusion.   All other systems reviewed and are negative.   Physical Exam Updated Vital Signs BP (!) 145/78   Pulse 75   Temp 98.3 F (36.8 C)   Resp  18   Ht 6' (1.829 m)   Wt 81 kg   SpO2 94%   BMI 24.22 kg/m  Physical Exam Vitals and nursing note reviewed.  Constitutional:      General: He is not in acute distress.    Appearance: He is well-developed.  HENT:     Head: Normocephalic and atraumatic.  Eyes:     Conjunctiva/sclera: Conjunctivae normal.  Cardiovascular:     Rate and Rhythm: Normal rate and regular rhythm.     Heart sounds: No murmur heard. Pulmonary:     Effort: Pulmonary effort is normal. No respiratory distress.      Breath sounds: Normal breath sounds.  Abdominal:     Palpations: Abdomen is soft.     Tenderness: There is no abdominal tenderness.  Musculoskeletal:        General: No swelling.     Cervical back: Neck supple.  Skin:    General: Skin is warm and dry.     Capillary Refill: Capillary refill takes less than 2 seconds.  Neurological:     Mental Status: He is alert and oriented to person, place, and time.     GCS: GCS eye subscore is 4. GCS verbal subscore is 5. GCS motor subscore is 6.     Cranial Nerves: Cranial nerves 2-12 are intact.     Sensory: Sensation is intact.     Motor: Tremor present.     Coordination: Coordination is intact.     Gait: Gait abnormal.  Psychiatric:        Mood and Affect: Mood normal.     ED Results / Procedures / Treatments   Labs (all labs ordered are listed, but only abnormal results are displayed) Labs Reviewed  COMPREHENSIVE METABOLIC PANEL - Abnormal; Notable for the following components:      Result Value   Sodium 131 (*)    Chloride 96 (*)    Glucose, Bld 120 (*)    Calcium 8.6 (*)    AST 13 (*)    Alkaline Phosphatase 34 (*)    All other components within normal limits  CBG MONITORING, ED - Abnormal; Notable for the following components:   Glucose-Capillary 110 (*)    All other components within normal limits  GROUP A STREP BY PCR  SARS CORONAVIRUS 2 BY RT PCR  CBC  URINALYSIS, ROUTINE W REFLEX MICROSCOPIC    EKG EKG Interpretation  Date/Time:  Sunday May 22 2022 19:13:34 EDT Ventricular Rate:  77 PR Interval:  192 QRS Duration: 90 QT Interval:  370 QTC Calculation: 419 R Axis:   61 Text Interpretation: Sinus rhythm Confirmed by Campbell Stall ([id number]) on 02/14/257 7:19:15 PM  Radiology CT Head Wo Contrast  Result Date: 05/22/2022 CLINICAL DATA:  Mental status change, unknown cause. EXAM: CT HEAD WITHOUT CONTRAST TECHNIQUE: Contiguous axial images were obtained from the base of the skull through the vertex without intravenous  contrast. RADIATION DOSE REDUCTION: This exam was performed according to the departmental dose-optimization program which includes automated exposure control, adjustment of the mA and/or kV according to patient size and/or use of iterative reconstruction technique. COMPARISON:  10/21/2011. FINDINGS: Brain: No acute intracranial hemorrhage, midline shift or mass effect. No extra-axial fluid collection. Generalized atrophy is noted. Periventricular white matter hypodensities are noted bilaterally. No hydrocephalus. Vascular: No hyperdense vessel or unexpected calcification. Skull: Normal. Negative for fracture or focal lesion. Sinuses/Orbits: Mild mucosal thickening in the ethmoid air cells. No acute orbital abnormality. Other: None. IMPRESSION: 1. No acute  intracranial process. 2. Atrophy with chronic microvascular ischemic changes. Electronically Signed   By: Brett Fairy M.D.   On: 05/22/2022 23:33   DG Chest Portable 1 View  Result Date: 05/22/2022 CLINICAL DATA:  Cough and fever. EXAM: PORTABLE CHEST 1 VIEW COMPARISON:  June 18, 2010 FINDINGS: Tortuosity and calcific atherosclerotic disease of the aorta. Cardiomediastinal silhouette is normal. Mediastinal contours appear intact. There is no evidence of focal airspace consolidation, pleural effusion or pneumothorax. Osseous structures are without acute abnormality. Soft tissues are grossly normal. IMPRESSION: No active disease. Electronically Signed   By: Fidela Salisbury M.D.   On: 05/22/2022 21:03    Procedures Procedures    Medications Ordered in ED Medications  Carbidopa-Levodopa ER (SINEMET CR) 25-100 MG tablet controlled release 1 tablet (has no administration in time range)  ketorolac (TORADOL) 15 MG/ML injection 15 mg (15 mg Intravenous Given 05/22/22 2326)  acetaminophen (TYLENOL) tablet 650 mg (650 mg Oral Given 05/22/22 2327)    ED Course/ Medical Decision Making/ A&P                           Medical Decision Making Amount  and/or Complexity of Data Reviewed Labs: ordered. Radiology: ordered.  Risk OTC drugs. Prescription drug management. Decision regarding hospitalization.   87:40 AM  71 year old male with past medical history of Parkinson's disease presenting for dizziness, confusion, and fever.  Patient is alert and oriented x3, no acute neurological deficits.  On arrival has a fever of 100.8 F.  No neck stiffness or rigidity.  Patient is well-appearing and nontoxic.  Currently no SIRS criteria concerning for sepsis.  Planes of throat pain however patient has a clear oropharynx.     -Strep test demonstrates no strep.  COVID-negative. -Patient complains of cough.  Chest x-ray demonstrates no pneumonia. -No history of nausea, vomiting, abdominal pain, or diarrhea. will hold off on abdominal imaging and labs at this time. -Urine analysis demonstrates no urinary tract infection.  Patient is very lightheaded and dizzy upon attempting to stand.  Able to stand unassisted.  Unable to walk.  Patient states this is far from his baseline stating he is normally active and goes to the gym 5 to 6 days a week.    Recommending admission for new confusion and new inability to ambulate and otherwise high functioning Parkinson's patient likely secondary to fever and viral URI.  Unable to take even 2 steps without t2 man assistance at this time.  Lives home alone with his elderly wife.  Accepted by Dr. Alcario Drought        Final Clinical Impression(s) / ED Diagnoses Final diagnoses:  Confusion  Fever, unspecified fever cause  Unstable gait    Rx / DC Orders ED Discharge Orders     None         [name] Cure, DO 08/12/24 3664    [name] Cure, DO 40/34/74 2595    [name] Cure, DO 63/87/56 4332

## 2022-05-22 NOTE — ED Triage Notes (Signed)
POV with wife from home, pt sts that dizziness and confusion began in the middle of the night last night, unsure of what time, wife sts that pt has been sick with sore throat since Friday and is getting progressively more confused today. She sts that he was unsure of where they were going and unable to answer questions correctly. Pt alert and oriented x 4, unsteady gait walking to room, BGL 110, hx of parkinsons

## 2022-05-23 ENCOUNTER — Encounter (HOSPITAL_COMMUNITY): Payer: Self-pay

## 2022-05-23 DIAGNOSIS — R262 Difficulty in walking, not elsewhere classified: Secondary | ICD-10-CM | POA: Diagnosis not present

## 2022-05-23 DIAGNOSIS — E871 Hypo-osmolality and hyponatremia: Secondary | ICD-10-CM

## 2022-05-23 DIAGNOSIS — Z79899 Other long term (current) drug therapy: Secondary | ICD-10-CM | POA: Diagnosis not present

## 2022-05-23 DIAGNOSIS — Z20822 Contact with and (suspected) exposure to covid-19: Secondary | ICD-10-CM | POA: Diagnosis not present

## 2022-05-23 DIAGNOSIS — J069 Acute upper respiratory infection, unspecified: Secondary | ICD-10-CM

## 2022-05-23 DIAGNOSIS — R059 Cough, unspecified: Secondary | ICD-10-CM | POA: Diagnosis not present

## 2022-05-23 DIAGNOSIS — G9341 Metabolic encephalopathy: Secondary | ICD-10-CM | POA: Diagnosis not present

## 2022-05-23 DIAGNOSIS — R509 Fever, unspecified: Secondary | ICD-10-CM | POA: Diagnosis not present

## 2022-05-23 DIAGNOSIS — Z87891 Personal history of nicotine dependence: Secondary | ICD-10-CM | POA: Diagnosis not present

## 2022-05-23 DIAGNOSIS — I10 Essential (primary) hypertension: Secondary | ICD-10-CM

## 2022-05-23 DIAGNOSIS — R410 Disorientation, unspecified: Secondary | ICD-10-CM | POA: Diagnosis not present

## 2022-05-23 DIAGNOSIS — G20 Parkinson's disease: Secondary | ICD-10-CM | POA: Diagnosis not present

## 2022-05-23 DIAGNOSIS — R2689 Other abnormalities of gait and mobility: Secondary | ICD-10-CM | POA: Diagnosis not present

## 2022-05-23 HISTORY — DX: Hypo-osmolality and hyponatremia: E87.1

## 2022-05-23 HISTORY — DX: Metabolic encephalopathy: G93.41

## 2022-05-23 HISTORY — DX: Difficulty in walking, not elsewhere classified: R26.2

## 2022-05-23 HISTORY — DX: Acute upper respiratory infection, unspecified: J06.9

## 2022-05-23 LAB — RESPIRATORY PANEL BY PCR

## 2022-05-23 LAB — COMPREHENSIVE METABOLIC PANEL
ALT: 6 U/L (ref 0–44)
AST: 14 U/L — ABNORMAL LOW (ref 15–41)
Albumin: 3.9 g/dL (ref 3.5–5.0)
Alkaline Phosphatase: 35 U/L — ABNORMAL LOW (ref 38–126)
Anion gap: 8 (ref 5–15)
BUN: 13 mg/dL (ref 8–23)
CO2: 24 mmol/L (ref 22–32)
Calcium: 8.7 mg/dL — ABNORMAL LOW (ref 8.9–10.3)
Chloride: 100 mmol/L (ref 98–111)
Creatinine, Ser: 0.96 mg/dL (ref 0.61–1.24)
GFR, Estimated: >60 mL/min (ref >60)
Glucose, Bld: 115 mg/dL — ABNORMAL HIGH (ref 70–99)
Potassium: 4 mmol/L (ref 3.5–5.1)
Sodium: 132 mmol/L — ABNORMAL LOW (ref 135–145)
Total Bilirubin: 1 mg/dL (ref 0.3–1.2)
Total Protein: 6.6 g/dL (ref 6.5–8.1)

## 2022-05-23 LAB — MAGNESIUM: Magnesium: 2 mg/dL (ref 1.7–2.4)

## 2022-05-23 LAB — TSH: TSH: 1.316 u[IU]/mL (ref 0.350–4.500)

## 2022-05-23 MED ORDER — HEPARIN SODIUM (PORCINE) 5000 UNIT/ML IJ SOLN
5000.0000 [IU] | Freq: Three times a day (TID) | INTRAMUSCULAR | Status: DC
  Administered 2022-05-23 (×2): 5000 [IU] via SUBCUTANEOUS
  Filled 2022-05-23 (×2): qty 1

## 2022-05-23 MED ORDER — ACETAMINOPHEN 650 MG RE SUPP: 650.0000 mg | Freq: Four times a day (QID) | RECTAL | Status: DC | PRN

## 2022-05-23 MED ORDER — CARBIDOPA-LEVODOPA 25-100 MG PO TABS
1.0000 | ORAL_TABLET | Freq: Four times a day (QID) | ORAL | Status: DC
  Administered 2022-05-23 (×3): 1 via ORAL
  Filled 2022-05-23 (×3): qty 1

## 2022-05-23 MED ORDER — PHENOL 1.4 % MT LIQD
1.0000 | OROMUCOSAL | Status: DC | PRN
  Filled 2022-05-23: qty 177

## 2022-05-23 MED ORDER — ESCITALOPRAM OXALATE 10 MG PO TABS
10.0000 mg | ORAL_TABLET | Freq: Every day | ORAL | Status: DC
  Administered 2022-05-23: 10 mg via ORAL
  Filled 2022-05-23: qty 1

## 2022-05-23 MED ORDER — SODIUM CHLORIDE 0.9 % IV SOLN: INTRAVENOUS | Status: AC

## 2022-05-23 MED ORDER — ONDANSETRON HCL 4 MG PO TABS: 4.0000 mg | ORAL_TABLET | Freq: Four times a day (QID) | ORAL | Status: DC | PRN

## 2022-05-23 MED ORDER — TAMSULOSIN HCL 0.4 MG PO CAPS
0.4000 mg | ORAL_CAPSULE | Freq: Every day | ORAL | Status: DC
  Administered 2022-05-23: 0.4 mg via ORAL
  Filled 2022-05-23: qty 1

## 2022-05-23 MED ORDER — PHENOL 1.4 % MT LIQD: 1.0000 | OROMUCOSAL | 0 refills | Status: AC | PRN

## 2022-05-23 MED ORDER — CARBIDOPA-LEVODOPA ER 50-200 MG PO TBCR: 1.0000 | EXTENDED_RELEASE_TABLET | Freq: Every day | ORAL | Status: DC

## 2022-05-23 MED ORDER — ONDANSETRON HCL 4 MG/2ML IJ SOLN: 4.0000 mg | Freq: Four times a day (QID) | INTRAMUSCULAR | Status: DC | PRN

## 2022-05-23 MED ORDER — ACETAMINOPHEN 325 MG PO TABS
650.0000 mg | ORAL_TABLET | Freq: Four times a day (QID) | ORAL | Status: DC | PRN
  Administered 2022-05-23 (×2): 650 mg via ORAL
  Filled 2022-05-23 (×2): qty 2

## 2022-05-23 MED ORDER — ATORVASTATIN CALCIUM 40 MG PO TABS
40.0000 mg | ORAL_TABLET | Freq: Every day | ORAL | Status: DC
Start: 2022-05-23 — End: 2022-05-23

## 2022-05-23 NOTE — Assessment & Plan Note (Signed)
Likely due to viral illness. Do not think he needs LP.

## 2022-05-23 NOTE — Evaluation (Signed)
Physical Therapy Evaluation Patient Details Name: Jeremy Davidson MRN: [id number] DOB: 1951-05-06 Today's Date: 05/23/2022  History of Present Illness  71 year old male with a history of Parkinson's disease, hypertension, hyperlipidemia and presented with confusion and difficulty walking.  Pt admitted 05/22/22 for viral URI  Clinical Impression  Pt admitted with above diagnosis.  Pt currently with functional limitations due to the deficits listed below (see PT Problem List). Pt will benefit from skilled PT to increase their independence and safety with mobility to allow discharge to the venue listed below.  Pt reports feeling better and able to ambulate in hallway without assistive device.  Pt very active at baseline.  SpO2 98% on room air upon returning to room after ambulation.  Pt may benefit from HHPT upon d/c if agreeable (may be helpful with guiding and monitoring activities/exercises prior to pt returning to personal trainer).        Recommendations for follow up therapy are one component of a multi-disciplinary discharge planning process, led by the attending physician.  Recommendations may be updated based on patient status, additional functional criteria and insurance authorization.  Follow Up Recommendations Home health PT      Assistance Recommended at Discharge Set up Supervision/Assistance  Patient can return home with the following  Help with stairs or ramp for entrance;Direct supervision/assist for medications management;Direct supervision/assist for financial management    Equipment Recommendations None recommended by PT  Recommendations for Other Services       Functional Status Assessment Patient has had a recent decline in their functional status and demonstrates the ability to make significant improvements in function in a reasonable and predictable amount of time.     Precautions / Restrictions Precautions Precautions: Fall      Mobility  Bed Mobility Overal  bed mobility: Modified Independent                  Transfers Overall transfer level: Needs assistance Equipment used: Rolling walker (2 wheels) Transfers: Sit to/from Stand Sit to Stand: Min guard           General transfer comment: difficulty with rise from regular surface height, required second attempt to complete however able to perform without assist    Ambulation/Gait Ambulation/Gait assistance: Min guard Gait Distance (Feet): 200 Feet Assistive device: Rolling walker (2 wheels) Gait Pattern/deviations: Step-through pattern, Decreased stride length       General Gait Details: pt appers steady with RW and lifting RW to turn around so ambulated 100 ft back too room without RW and pt did not have any unsteadiness or overt LOB, pt reports mild SOB; SPO2 98% on room air  Stairs            Wheelchair Mobility    Modified Rankin (Stroke Patients Only)       Balance                                             Pertinent Vitals/Pain Pain Assessment Pain Assessment: No/denies pain    Home Living Family/patient expects to be discharged to:: Private residence Living Arrangements: Spouse/significant other   Type of Home: House       Alternate Level Stairs-Number of Steps: flight Home Layout: Two level Home Equipment: Conservation officer, nature (2 wheels)      Prior Function Prior Level of Function : Independent/Modified Independent  Mobility Comments: 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, also goes to [employer]        Extremity/Trunk Assessment        Lower Extremity Assessment Lower Extremity Assessment: Overall WFL for tasks assessed    Cervical / Trunk Assessment Cervical / Trunk Assessment: Normal  Communication   Communication: No difficulties  Cognition Arousal/Alertness: Awake/alert Behavior During Therapy: WFL for tasks assessed/performed Overall Cognitive  Status: Within Functional Limits for tasks assessed                                 General Comments: pt reports he still has some confusion and difficulty getting words out at times, no issues observed at time of session        General Comments      Exercises     Assessment/Plan    PT Assessment Patient needs continued PT services  PT Problem List Decreased strength;Decreased activity tolerance;Decreased mobility;Decreased knowledge of use of DME       PT Treatment Interventions DME instruction;Gait training;Balance training;Therapeutic exercise;Functional mobility training;Therapeutic activities;Patient/family education;Stair training    PT Goals (Current goals can be found in the Care Plan section)  Acute Rehab PT Goals PT Goal Formulation: With patient/family Time For Goal Achievement: 06/06/22 Potential to Achieve Goals: Good    Frequency Min 3X/week     Co-evaluation               AM-PAC PT "6 Clicks" Mobility  Outcome Measure Help needed turning from your back to your side while in a flat bed without using bedrails?: A Little Help needed moving from lying on your back to sitting on the side of a flat bed without using bedrails?: A Little Help needed moving to and from a bed to a chair (including a wheelchair)?: A Little Help needed standing up from a chair using your arms (e.g., wheelchair or bedside chair)?: A Little Help needed to walk in hospital room?: A Little Help needed climbing 3-5 steps with a railing? : A Little 6 Click Score: 18    End of Session Equipment Utilized During Treatment: Gait belt Activity Tolerance: Patient tolerated treatment well Patient left: in chair;with call bell/phone within reach;with chair alarm set;with family/visitor present   PT Visit Diagnosis: Difficulty in walking, not elsewhere classified (R26.2)    Time: 3614-4315 PT Time Calculation (min) (ACUTE ONLY): 28 min   Charges:   PT Evaluation $PT  Eval Low Complexity: 1 Low        Kati PT, DPT Physical Therapist Acute Rehabilitation Services Preferred contact method: Secure Chat Weekend Pager Only: [phone] Office: [place] 05/23/2022, 11:32 AM

## 2022-05-23 NOTE — Subjective & Objective (Signed)
CC: confusion, difficulty walking HPI: 71 year old male with a history of Parkinson's disease, hypertension, hyperlipidemia presents to the ER today with confusion and difficulty walking.  Patient still has having difficulty recalling the events of this weekend.  He thinks he got sick.  He states that he has been around his grandkids who are also ill.  He states he think he may have gone to the [place] this weekend but then states that he was been at home all weekend.  He states he is very confused and realizes he is confused.  He has been unable to walk without assistance which is unusual for him.  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  Arrival to the ER temp 100.8 heart rate 84 blood pressure 171/85 satting 96% on room air.  Labs showed a serum sodium 131, potassium 3.9, chloride 96, BUN of 11, creatinine 0.93  Strep throat PCR is negative.  COVID was negative.  UA was negative.  CBC completely normal.  Patient having difficulty walking.  [facility] hospitalist contacted for admission.

## 2022-05-23 NOTE — Assessment & Plan Note (Signed)
Continue lipitor 40mg daily. 

## 2022-05-23 NOTE — Plan of Care (Signed)
Plan of care discussed.  Agrees to use call button to report changes in condition.

## 2022-05-23 NOTE — TOC Initial Note (Addendum)
Transition of Care [facility]) - Initial/Assessment Note   Patient Details  Name: Jeremy Davidson MRN: [id number] Date of Birth: 1951-03-13  [facility] of [facility]) CM/SW Contact:    Sherie Don, LCSW Phone Number: 05/23/2022, 2:11 PM  Clinical Narrative: PT evaluation recommended HHPT. CSW spoke with patient and he is agreeable to [facility] referral to an agency in-network with his insurance. CSW made [facility] referral to [facility] with [place], which was accepted. CSW updated patient. TOC awaiting HHPT orders.  Addendum: HH orders are in and patient will discharge home today. CSW updated Claiborne Billings with [place].  Expected Discharge Plan: [place] Barriers to Discharge: Continued Medical Work up  Patient Goals and CMS Choice Patient states their goals for this hospitalization and ongoing recovery are:: Return home CMS Medicare.gov Compare Post Acute Care list provided to:: Patient Choice offered to / list presented to : Patient  Expected Discharge Plan and Services Expected Discharge Plan: [place] In-house Referral: Clinical Social Work Post Acute Care Choice: [place] arrangements for the past 2 months: Single Family Home          DME Arranged: N/A DME Agency: NA HH Arranged: PT HH Agency: [facility] Date [employer]: 05/23/22 Time HH Agency Contacted: 50 Representative spoke with at [place]: Claiborne Billings  Prior Living Arrangements/Services Living arrangements for the past 2 months: [place] Lives with:: Spouse Patient language and need for interpreter reviewed:: Yes Do you feel safe going back to the place where you live?: Yes      Need for Family Participation in Patient Care: No (Comment) Care giver support system in place?: Yes (comment) Criminal Activity/Legal Involvement Pertinent to Current Situation/Hospitalization: No - Comment as needed  Activities of Daily Living Home Assistive Devices/Equipment:  Eyeglasses ADL Screening (condition at time of admission) Patient's cognitive ability adequate to safely complete daily activities?: Yes Is the patient deaf or have difficulty hearing?: No Does the patient have difficulty seeing, even when wearing glasses/contacts?: No Does the patient have difficulty concentrating, remembering, or making decisions?: No Patient able to express need for assistance with ADLs?: Yes Does the patient have difficulty dressing or bathing?: No Independently performs ADLs?: Yes (appropriate for developmental age) Does the patient have difficulty walking or climbing stairs?: Yes (Parkinsons with acute AMS change reported) Weakness of Legs: None Weakness of Arms/Hands: None  Permission Sought/Granted Permission sought to share information with : Other (comment) Permission granted to share information with : Yes, Verbal Permission Granted Permission granted to share info w AGENCY: [place] agencies  Emotional Assessment Attitude/Demeanor/Rapport: Engaged Affect (typically observed): Accepting Orientation: : Oriented to Self, Oriented to Place, Oriented to  Time, Oriented to Situation Alcohol / Substance Use: Not Applicable Psych Involvement: No (comment)  Admission diagnosis:  Confusion [R41.0] Unstable gait [R26.81] Viral URI with cough [J06.9] Fever, unspecified fever cause [R50.9] Patient Active Problem List   Diagnosis Date Noted   Viral URI with cough 78/29/5621   Acute metabolic encephalopathy 30/86/5784   Difficulty walking 05/23/2022   Hyponatremia 05/23/2022   Parkinson's disease ([place]) 03/17/2017   Essential hypertension 09/09/2015   Hyperlipidemia 09/09/2015   PCP:  Lujean Amel, MD Pharmacy:   [id number] [address] [place] [address] Phone: [phone] Fax: [phone]  [id number] [address] [address]Phone: [phone]: [phone] Readmission Risk  Interventions     No data to display

## 2022-05-23 NOTE — Assessment & Plan Note (Signed)
Mild.  Serum sodium 131 should not cause this level of confusion.  Continue with IV normal saline.  Check TSH.

## 2022-05-23 NOTE — Assessment & Plan Note (Signed)
-  Continue carbidopa/levodopa 25/100, 1 tablet at 6 AM/10 AM/2 PM/6 PM  -continue carbidopa/levodopa 50/200 at bedtime

## 2022-05-23 NOTE — Progress Notes (Signed)
Discharge instructions reviewed with patient and wife. All questions and concerns addressed. IV removed per protocol, tolerated well, intact. All belongings given. No s/sx of distress noted.

## 2022-05-23 NOTE — Assessment & Plan Note (Signed)
Observation bed. Check Viral respiratory panel. Continue airborne isolation.

## 2022-05-23 NOTE — Assessment & Plan Note (Signed)
Stable

## 2022-05-23 NOTE — H&P (Signed)
History and Physical    Jeremy Davidson BVQ:[id number] DOB: 1951-02-07 DOA: 05/22/2022  DOS: the patient was seen and examined on 05/22/2022  PCP: Lujean Amel, MD   Patient coming from: Home  I have personally briefly reviewed patient's old medical records in [place]  CC: confusion, difficulty walking HPI: 71 year old male with a history of Parkinson's disease, hypertension, hyperlipidemia presents to the ER today with confusion and difficulty walking.  Patient still has having difficulty recalling the events of this weekend.  He thinks he got sick.  He states that he has been around his grandkids who are also ill.  He states he think he may have gone to the [place] this weekend but then states that he was been at home all weekend.  He states he is very confused and realizes he is confused.  He has been unable to walk without assistance which is unusual for him.  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  Arrival to the ER temp 100.8 heart rate 84 blood pressure 171/85 satting 96% on room air.  Labs showed a serum sodium 131, potassium 3.9, chloride 96, BUN of 11, creatinine 0.93  Strep throat PCR is negative.  COVID was negative.  UA was negative.  CBC completely normal.  Patient having difficulty walking.  [facility] hospital[facility] contacted for admission.   ED Course: Mild hyponatremia.  Patient continues to be confused and unable to walk.  Review of Systems:  Review of Systems  Unable to perform ROS: Other  Due to poor historical recall.  Past Medical History:  Diagnosis Date   Blood in stool 2010   Colonoscopy benign repeat 2015   Elevated cholesterol    Hypertension    Parkinson's disease ([place]) 03/17/2017    Past Surgical History:  Procedure Laterality Date   HERNIA REPAIR  06/23/2010   With large ultra pro hrnia system-- Dr Donne Hazel 1971. x2     reports that he  quit smoking about 23 years ago. His smoking use included cigarettes. He has never used smokeless tobacco. He reports current alcohol use. He reports that he does not use drugs.  No Known Allergies  History reviewed. No pertinent family history.  Prior to Admission medications   Medication Sig Start Date End Date Taking? Authorizing Provider  atorvastatin (LIPITOR) 40 MG tablet Take 40 mg by mouth at bedtime.    [provider]  carbidopa-levodopa (SINEMET CR) 50-200 MG tablet TAKE 1 TABLET BY MOUTH EVERYDAY AT BEDTIME 03/17/22   Tat, Rebecca S, DO  carbidopa-levodopa (SINEMET IR) 25-100 MG tablet Take 1 tablet by mouth 4 (four) times daily. 6am/10am/2pm/6pm 09/14/21   Tat, Eustace Quail, DO  docusate sodium (STOOL SOFTENER) 100 MG capsule Take 100 mg by mouth 2 (two) times daily.    [provider]  escitalopram (LEXAPRO) 10 MG tablet Take 1 tablet (10 mg total) by mouth daily. 03/17/22   Tat, Eustace Quail, DO  GEMTESA 75 MG TABS Take 1 tablet by mouth daily. 01/03/22   [provider]  Multiple Vitamin (MULTIVITAMIN) tablet Take 1 tablet by mouth daily.    [provider]  tamsulosin (FLOMAX) 0.4 MG CAPS capsule Take 0.4 mg by mouth daily. 02/11/22   [provider]    Physical Exam: Vitals:   05/23/22 0030 05/23/22 0100 05/23/22 0130 05/23/22 0233  BP: 136/82 133/85 135/80 138/72  Pulse:  73 69 68 71  Resp: '18 16 17 18  '$ Temp:    98.1 F (36.7 C)  TempSrc:    Oral  SpO2: 95% 97% 95% 98%  Weight:    78.2 kg  Height:    6' (1.829 m)    Physical Exam Vitals and nursing note reviewed.  Constitutional:      Comments: Chronically ill-appearing.  HENT:     Head: Normocephalic and atraumatic.     Nose: Nose normal.  Eyes:     General: No scleral icterus. Cardiovascular:     Rate and Rhythm: Normal rate and regular rhythm.     Pulses: Normal pulses.  Pulmonary:     Effort: Pulmonary effort is normal. No respiratory distress.     Breath sounds: No  wheezing or rales.  Abdominal:     General: Bowel sounds are normal. There is no distension.     Tenderness: There is no abdominal tenderness. There is no guarding or rebound.  Musculoskeletal:     Right lower leg: No edema.     Left lower leg: No edema.  Skin:    General: Skin is warm and dry.  Neurological:     Mental Status: He is alert.     Motor: Tremor present.     Comments: Patient realizes he is confused.  He knows he is in the hospital.  Cannot recall the events of this weekend and accurately.      Labs on Admission: I have personally reviewed following labs and imaging studies  CBC: Recent Labs  Lab 05/22/22 2018  WBC 10.3  HGB 13.6  HCT 39.2  MCV 92.0  PLT 856   Basic Metabolic Panel: Recent Labs  Lab 05/22/22 2018  NA 131*  K 3.9  CL 96*  CO2 26  GLUCOSE 120*  BUN 11  CREATININE 0.93  CALCIUM 8.6*   GFR: Estimated Creatinine Clearance: 77.6 mL/min (by C-G formula based on SCr of 0.93 mg/dL). Liver Function Tests: Recent Labs  Lab 05/22/22 2018  AST 13*  ALT <5  ALKPHOS 34*  BILITOT 0.7  PROT 6.8  ALBUMIN 4.2   No results for input(s): "LIPASE", "AMYLASE" in the last 168 hours. No results for input(s): "AMMONIA" in the last 168 hours. Coagulation Profile: No results for input(s): "INR", "PROTIME" in the last 168 hours. Cardiac Enzymes: No results for input(s): "CKTOTAL", "CKMB", "CKMBINDEX", "TROPONINI", "TROPONINIHS" in the last 168 hours. BNP (last 3 results) No results for input(s): "PROBNP" in the last 8760 hours. HbA1C: No results for input(s): "HGBA1C" in the last 72 hours. CBG: Recent Labs  Lab 05/22/22 1910  GLUCAP 110*   Lipid Profile: No results for input(s): "CHOL", "HDL", "LDLCALC", "TRIG", "CHOLHDL", "LDLDIRECT" in the last 72 hours. Thyroid Function Tests: No results for input(s): "TSH", "T4TOTAL", "FREET4", "T3FREE", "THYROIDAB" in the last 72 hours. Anemia Panel: No results for input(s): "VITAMINB12", "FOLATE",  "FERRITIN", "TIBC", "IRON", "RETICCTPCT" in the last 72 hours. Urine analysis:    Component Value Date/Time   COLORURINE YELLOW 05/22/2022 2030   [place] 05/22/2022 2030   [place] 1.012 05/22/2022 2030   PHURINE 7.5 05/22/2022 2030   GLUCOSEU NEGATIVE 05/22/2022 2030   [place] NEGATIVE 05/22/2022 2030   [place] NEGATIVE 05/22/2022 2030   [place] 05/22/2022 2030   PROTEINUR NEGATIVE 05/22/2022 2030   NITRITE NEGATIVE 05/22/2022 2030   LEUKOCYTESUR NEGATIVE 05/22/2022 2030    Radiological Exams on Admission: I have personally reviewed images CT Head Wo Contrast  Result Date: 05/22/2022  CLINICAL DATA:  Mental status change, unknown cause. EXAM: CT HEAD WITHOUT CONTRAST TECHNIQUE: Contiguous axial images were obtained from the base of the skull through the vertex without intravenous contrast. RADIATION DOSE REDUCTION: This exam was performed according to the departmental dose-optimization program which includes automated exposure control, adjustment of the mA and/or kV according to patient size and/or use of iterative reconstruction technique. COMPARISON:  10/21/2011. FINDINGS: Brain: No acute intracranial hemorrhage, midline shift or mass effect. No extra-axial fluid collection. Generalized atrophy is noted. Periventricular white matter hypodensities are noted bilaterally. No hydrocephalus. Vascular: No hyperdense vessel or unexpected calcification. Skull: Normal. Negative for fracture or focal lesion. Sinuses/Orbits: Mild mucosal thickening in the ethmoid air cells. No acute orbital abnormality. Other: None. IMPRESSION: 1. No acute intracranial process. 2. Atrophy with chronic microvascular ischemic changes. Electronically Signed   By: Brett Fairy M.D.   On: 05/22/2022 23:33   DG Chest Portable 1 View  Result Date: 05/22/2022 CLINICAL DATA:  Cough and fever. EXAM: PORTABLE CHEST 1 VIEW COMPARISON:  June 18, 2010 FINDINGS: Tortuosity and calcific atherosclerotic  disease of the aorta. Cardiomediastinal silhouette is normal. Mediastinal contours appear intact. There is no evidence of focal airspace consolidation, pleural effusion or pneumothorax. Osseous structures are without acute abnormality. Soft tissues are grossly normal. IMPRESSION: No active disease. Electronically Signed   By: Fidela Salisbury M.D.   On: 05/22/2022 21:03    EKG: My personal interpretation of EKG shows: NSR    Assessment/Plan Principal Problem:   Viral URI with cough Active Problems:   Acute metabolic encephalopathy   Difficulty walking   Hyponatremia   Essential hypertension   Hyperlipidemia   Parkinson's disease ([place])    Assessment and Plan: * Viral URI with cough Observation bed. Check Viral respiratory panel. Continue airborne isolation.  Hyponatremia Mild.  Serum sodium 131 should not cause this level of confusion.  Continue with IV normal saline.  Check TSH.  Acute metabolic encephalopathy Likely due to viral illness. Do not think he needs LP.  Parkinson's disease ([place]) -Continue carbidopa/levodopa 25/100, 1 tablet at 6 AM/10 AM/2 PM/6 PM  -continue carbidopa/levodopa 50/200 at bedtime  Hyperlipidemia Continue lipitor 40 mg daily.  Essential hypertension Stable.   DVT prophylaxis: SQ Heparin Code Status: Full Code Family Communication: no family at bedside  Disposition Plan: return home  Consults called: none  Admission status: Observation, Med-Surg   Kristopher Oppenheim, DO [facility] 05/23/2022, 4:08 AM

## 2022-06-01 DIAGNOSIS — E871 Hypo-osmolality and hyponatremia: Secondary | ICD-10-CM | POA: Diagnosis not present

## 2022-06-01 DIAGNOSIS — G20 Parkinson's disease: Secondary | ICD-10-CM | POA: Diagnosis not present

## 2022-06-02 NOTE — Discharge Summary (Signed)
Physician Discharge Summary   Patient: Jeremy Davidson MRN: [id number] DOB: [birth year]  Admit date:     05/22/2022  Discharge date: 05/23/2022  Discharge Physician: Bonnell Public   PCP: Lujean Amel, MD   Recommendations at discharge:    Follow up with PCP within 1 week of discharge  Discharge Diagnoses: Principal Problem:   Viral URI with cough Active Problems:   Acute metabolic encephalopathy   Difficulty walking   Hyponatremia   Essential hypertension   Hyperlipidemia   Parkinson's disease [facility])   Hospital Course: Patient is a 71 year old Caucasian male with history of Parkinson's disease, hypertension and hyperlipidemia.  Patient was admitted with confusion and difficulty walking.  Symptoms are felt to be secondary to the viral syndrome and volume depletion.  Patient was admitted for further assessment and management.  Patient was managed supportively.  Symptoms have resolved.  Patient be discharged back home to the care of the primary care provider.  Assessment and Plan: * Viral URI with cough: Checked Viral respiratory panel.  Continued airborne isolation.  Hyponatremia: Mild.   Serum sodium 947   Acute metabolic encephalopathy Likely due to viral illness.  Lumbar puncture not done.   With adequate hydration and supportive care, encephalopathy resolved.  Parkinson's disease ([place]) -Continued carbidopa/levodopa 25/100, 1 tablet at 6 AM/10 AM/2 PM/6 PM  -continued carbidopa/levodopa 50/200 at bedtime  Hyperlipidemia Continued lipitor 40 mg daily.  Essential hypertension Stable.         Consultants: None Procedures performed: None Disposition: Home Diet recommendation:  Discharge Diet Orders (From admission, onward)     Start     Ordered   05/23/22 0000  Diet - low sodium heart healthy        05/23/22 1424            DISCHARGE MEDICATION: Allergies as of 05/23/2022   No Known Allergies      Medication List     STOP taking these  medications    Cold-Eeze Lozg   ELDERBERRY PO   SOLIFENACIN SUCCINATE PO       TAKE these medications    atorvastatin 40 MG tablet Commonly known as: LIPITOR Take 40 mg by mouth at bedtime.   carbidopa-levodopa 25-100 MG tablet Commonly known as: SINEMET IR Take 1 tablet by mouth 4 (four) times daily. 6am/10am/2pm/6pm What changed: Another medication with the same name was changed. Make sure you understand how and when to take each.   carbidopa-levodopa 50-200 MG tablet Commonly known as: SINEMET CR TAKE 1 TABLET BY MOUTH EVERYDAY AT BEDTIME What changed: See the new instructions.   escitalopram 10 MG tablet Commonly known as: Lexapro Take 1 tablet (10 mg total) by mouth daily.   multivitamin tablet Take 1 tablet by mouth daily. Men's health formula multi-mineral 50+   phenol 1.4 % Liqd Commonly known as: CHLORASEPTIC Use as directed 1 spray in the mouth or throat as needed for throat irritation / pain.   Stool Softener 100 MG capsule Generic drug: docusate sodium Take 100 mg by mouth 2 (two) times daily.   tamsulosin 0.4 MG Caps capsule Commonly known as: FLOMAX Take 0.4 mg by mouth daily.   vitamin C 1000 MG tablet Take 1,000 mg by mouth daily.        Follow-up Information     [facility] [place] Follow up.   Specialty: Home Health Services Why: [facility] will provide PT in the home after discharge. Contact information: [place]  [address] [phone]                Discharge Exam: Danley Danker Weights   05/22/22 1914 05/23/22 0233  Weight: 81 kg 78.2 kg     Condition at discharge: stable  The results of significant diagnostics from this hospitalization (including imaging, microbiology, ancillary and laboratory) are listed below for reference.   Imaging Studies: CT Head Wo Contrast  Result Date: 05/22/2022 CLINICAL DATA:  Mental status change, unknown cause. EXAM: CT HEAD WITHOUT CONTRAST TECHNIQUE: Contiguous  axial images were obtained from the base of the skull through the vertex without intravenous contrast. RADIATION DOSE REDUCTION: This exam was performed according to the departmental dose-optimization program which includes automated exposure control, adjustment of the mA and/or kV according to patient size and/or use of iterative reconstruction technique. COMPARISON:  10/21/2011. FINDINGS: Brain: No acute intracranial hemorrhage, midline shift or mass effect. No extra-axial fluid collection. Generalized atrophy is noted. Periventricular white matter hypodensities are noted bilaterally. No hydrocephalus. Vascular: No hyperdense vessel or unexpected calcification. Skull: Normal. Negative for fracture or focal lesion. Sinuses/Orbits: Mild mucosal thickening in the ethmoid air cells. No acute orbital abnormality. Other: None. IMPRESSION: 1. No acute intracranial process. 2. Atrophy with chronic microvascular ischemic changes. Electronically Signed   By: Brett Fairy M.D.   On: 05/22/2022 23:33   DG Chest Portable 1 View  Result Date: 05/22/2022 CLINICAL DATA:  Cough and fever. EXAM: PORTABLE CHEST 1 VIEW COMPARISON:  June 18, 2010 FINDINGS: Tortuosity and calcific atherosclerotic disease of the aorta. Cardiomediastinal silhouette is normal. Mediastinal contours appear intact. There is no evidence of focal airspace consolidation, pleural effusion or pneumothorax. Osseous structures are without acute abnormality. Soft tissues are grossly normal. IMPRESSION: No active disease. Electronically Signed   By: Fidela Salisbury M.D.   On: 05/22/2022 21:03    Microbiology: Results for orders placed or performed during the hospital encounter of 05/22/22  Group A Strep by PCR     Status: None   Collection Time: 05/22/22  8:00 PM   Specimen: Anterior Nasal Swab; Sterile Swab  Result Value Ref Range Status   Group A Strep by PCR NOT DETECTED NOT DETECTED Final    Comment: Performed at [facility], [address] [place], [place] [address]  SARS Coronavirus 2 by RT PCR (hospital order, performed in [facility] hospital lab) *cepheid single result test* Anterior Nasal Swab     Status: None   Collection Time: 05/22/22  8:00 PM   Specimen: Anterior Nasal Swab  Result Value Ref Range Status   SARS Coronavirus 2 by RT PCR NEGATIVE NEGATIVE Final    Comment: (NOTE) SARS-CoV-2 target nucleic acids are NOT DETECTED.  The SARS-CoV-2 RNA is generally detectable in upper and lower respiratory specimens during the acute phase of infection. The lowest concentration of SARS-CoV-2 viral copies this assay can detect is 250 copies / mL. A negative result does not preclude SARS-CoV-2 infection and should not be used as the sole basis for treatment or other patient management decisions.  A negative result may occur with improper specimen collection / handling, submission of specimen other than nasopharyngeal swab, presence of viral mutation(s) within the areas targeted by this assay, and inadequate number of viral copies (<250 copies / mL). A negative result must be combined with clinical observations, patient history, and epidemiological information.  Fact Sheet for Patients:   https://www.patel.info/  Fact Sheet for Healthcare Providers: https://hall.com/  This test is not yet approved or  cleared  by the [place] and has been authorized for detection and/or diagnosis of SARS-CoV-2 by FDA under an Emergency Use Authorization (EUA).  This EUA will remain in effect (meaning this test can be used) for the duration of the COVID-19 declaration under Section 564(b)(1) of the Act, 21 U.S.C. section 360bbb-3(b)(1), unless the authorization is terminated or revoked sooner.  Performed at [facility], [place], [place] [address]   Respiratory (~20 pathogens) panel by PCR     Status: Abnormal    Collection Time: 05/23/22  6:01 AM   Specimen: Nasopharyngeal Swab; Respiratory  Result Value Ref Range Status   Adenovirus NOT DETECTED NOT DETECTED Final   Coronavirus 229E NOT DETECTED NOT DETECTED Final    Comment: (NOTE) The Coronavirus on the Respiratory Panel, DOES NOT test for the novel  Coronavirus (2019 nCoV)    Coronavirus HKU1 NOT DETECTED NOT DETECTED Final   Coronavirus NL63 NOT DETECTED NOT DETECTED Final   Coronavirus OC43 NOT DETECTED NOT DETECTED Final   Metapneumovirus NOT DETECTED NOT DETECTED Final   Rhinovirus / Enterovirus NOT DETECTED NOT DETECTED Final   Influenza A NOT DETECTED NOT DETECTED Final   Influenza B NOT DETECTED NOT DETECTED Final   Parainfluenza Virus 1 NOT DETECTED NOT DETECTED Final   Parainfluenza Virus 2 DETECTED (A) NOT DETECTED Final   Parainfluenza Virus 3 NOT DETECTED NOT DETECTED Final   Parainfluenza Virus 4 NOT DETECTED NOT DETECTED Final   Respiratory Syncytial Virus NOT DETECTED NOT DETECTED Final   Bordetella pertussis NOT DETECTED NOT DETECTED Final   Bordetella Parapertussis NOT DETECTED NOT DETECTED Final   Chlamydophila pneumoniae NOT DETECTED NOT DETECTED Final   Mycoplasma pneumoniae NOT DETECTED NOT DETECTED Final    Comment: Performed at [facility], [place]. [address] [place], [place] [address]    Labs: CBC: No results for input(s): "WBC", "NEUTROABS", "HGB", "HCT", "MCV", "PLT" in the last 168 hours. Basic Metabolic Panel: No results for input(s): "NA", "K", "CL", "CO2", "GLUCOSE", "BUN", "CREATININE", "CALCIUM", "MG", "PHOS" in the last 168 hours. Liver Function Tests: No results for input(s): "AST", "ALT", "ALKPHOS", "BILITOT", "PROT", "ALBUMIN" in the last 168 hours. CBG: No results for input(s): "GLUCAP" in the last 168 hours.  Discharge time spent: greater than 30 minutes.  Signed: Bonnell Public, MD [facility] 06/02/2022

## 2022-06-06 ENCOUNTER — Other Ambulatory Visit: Payer: Self-pay | Admitting: Family Medicine

## 2022-06-06 ENCOUNTER — Ambulatory Visit
Admission: RE | Admit: 2022-06-06 | Discharge: 2022-06-06 | Disposition: A | Discharge status: Home / Self Care | Payer: Medicare HMO | Source: Ambulatory Visit | Attending: Family Medicine | Admitting: Family Medicine

## 2022-06-06 DIAGNOSIS — J209 Acute bronchitis, unspecified: Secondary | ICD-10-CM

## 2022-06-06 DIAGNOSIS — R059 Cough, unspecified: Secondary | ICD-10-CM | POA: Diagnosis not present

## 2022-06-07 ENCOUNTER — Other Ambulatory Visit: Payer: Self-pay | Admitting: Neurology

## 2022-06-07 DIAGNOSIS — G20 Parkinson's disease: Secondary | ICD-10-CM

## 2022-06-17 ENCOUNTER — Other Ambulatory Visit: Payer: Self-pay | Admitting: Family Medicine

## 2022-06-17 ENCOUNTER — Ambulatory Visit
Admission: RE | Admit: 2022-06-17 | Discharge: 2022-06-17 | Disposition: A | Discharge status: Home / Self Care | Payer: Medicare HMO | Source: Ambulatory Visit | Attending: Family Medicine | Admitting: Family Medicine

## 2022-06-17 DIAGNOSIS — J189 Pneumonia, unspecified organism: Secondary | ICD-10-CM | POA: Diagnosis not present

## 2022-06-17 DIAGNOSIS — J181 Lobar pneumonia, unspecified organism: Secondary | ICD-10-CM | POA: Diagnosis not present

## 2022-06-17 DIAGNOSIS — R918 Other nonspecific abnormal finding of lung field: Secondary | ICD-10-CM | POA: Diagnosis not present

## 2022-06-28 DIAGNOSIS — R351 Nocturia: Secondary | ICD-10-CM | POA: Diagnosis not present

## 2022-06-28 DIAGNOSIS — R3912 Poor urinary stream: Secondary | ICD-10-CM | POA: Diagnosis not present

## 2022-06-28 DIAGNOSIS — N401 Enlarged prostate with lower urinary tract symptoms: Secondary | ICD-10-CM | POA: Diagnosis not present

## 2022-09-01 ENCOUNTER — Other Ambulatory Visit: Payer: Self-pay | Admitting: Neurology

## 2022-09-01 DIAGNOSIS — G20A1 Parkinson's disease without dyskinesia, without mention of fluctuations: Secondary | ICD-10-CM

## 2022-09-01 DIAGNOSIS — F330 Major depressive disorder, recurrent, mild: Secondary | ICD-10-CM

## 2022-09-02 DIAGNOSIS — Z809 Family history of malignant neoplasm, unspecified: Secondary | ICD-10-CM | POA: Diagnosis not present

## 2022-09-02 DIAGNOSIS — N529 Male erectile dysfunction, unspecified: Secondary | ICD-10-CM | POA: Diagnosis not present

## 2022-09-02 DIAGNOSIS — E785 Hyperlipidemia, unspecified: Secondary | ICD-10-CM | POA: Diagnosis not present

## 2022-09-02 DIAGNOSIS — Z8249 Family history of ischemic heart disease and other diseases of the circulatory system: Secondary | ICD-10-CM | POA: Diagnosis not present

## 2022-09-02 DIAGNOSIS — R32 Unspecified urinary incontinence: Secondary | ICD-10-CM | POA: Diagnosis not present

## 2022-09-02 DIAGNOSIS — N400 Enlarged prostate without lower urinary tract symptoms: Secondary | ICD-10-CM | POA: Diagnosis not present

## 2022-09-02 DIAGNOSIS — Z008 Encounter for other general examination: Secondary | ICD-10-CM | POA: Diagnosis not present

## 2022-09-02 DIAGNOSIS — R69 Illness, unspecified: Secondary | ICD-10-CM | POA: Diagnosis not present

## 2022-09-02 DIAGNOSIS — G20A1 Parkinson's disease without dyskinesia, without mention of fluctuations: Secondary | ICD-10-CM | POA: Diagnosis not present

## 2022-09-02 DIAGNOSIS — Z87891 Personal history of nicotine dependence: Secondary | ICD-10-CM | POA: Diagnosis not present

## 2022-09-05 ENCOUNTER — Other Ambulatory Visit: Payer: Self-pay | Admitting: Neurology

## 2022-09-05 DIAGNOSIS — G20A1 Parkinson's disease without dyskinesia, without mention of fluctuations: Secondary | ICD-10-CM

## 2022-09-14 DIAGNOSIS — M5432 Sciatica, left side: Secondary | ICD-10-CM | POA: Diagnosis not present

## 2022-09-14 DIAGNOSIS — M9903 Segmental and somatic dysfunction of lumbar region: Secondary | ICD-10-CM | POA: Diagnosis not present

## 2022-09-15 DIAGNOSIS — M9903 Segmental and somatic dysfunction of lumbar region: Secondary | ICD-10-CM | POA: Diagnosis not present

## 2022-09-15 DIAGNOSIS — M5432 Sciatica, left side: Secondary | ICD-10-CM | POA: Diagnosis not present

## 2022-09-19 DIAGNOSIS — M9903 Segmental and somatic dysfunction of lumbar region: Secondary | ICD-10-CM | POA: Diagnosis not present

## 2022-09-19 DIAGNOSIS — M5432 Sciatica, left side: Secondary | ICD-10-CM | POA: Diagnosis not present

## 2022-09-20 NOTE — Progress Notes (Unsigned)
Assessment/Plan:   1.  Parkinsons Disease  -Increase carbidopa/levodopa 25/100, 2 tablet at 6 AM/ and 1 at 10 AM/2 PM/6 PM.  May need to increase further next visit  -continue carbidopa/levodopa 50/200 at bed for first AM on  -We discussed that it used to be thought that levodopa would increase risk of melanoma but now it is believed that Parkinsons itself likely increases risk of melanoma. he is to get regular skin checks.  He went about 6 months ago.    -refer to ST  2.  Insomnia  -no napping after 2 pm  -add melatonn, 3 mg at bedtime  3.  Constipation  -discussed nature and pathophysiology and association with PD  -discussed importance of hydration.  Pt is to increase water intake  -pt is given a copy of the rancho recipe  -recommended daily colace  -recommended miralax prn   4.  Memory change  -Doing mental and physical exercises  -refer for neurocog testing  5.  Depression  -continue lexapro 10 mg daily  6.  Fatigue  -may be related to #5 but discussed that fatigue is the #1 treatment resistant complaint of Parkinson's patients.  7.  LBP  -started after personal training 2 weeks ago  -they wanted a referral to ortho but discussed that this referral will be denied without imaging.  Unfortunately, imaging of the back will be denied at 2 weeks as they will require PT first as most  gets better.  We will refer to PT first.  If not better, we can image and send to ortho  Subjective:   Jeremy Davidson was seen today in follow up for Parkinsons disease.  My previous records were reviewed prior to todays visit as well as outside records available to me.  Wife supplements history.  Restarted on Lexapro last visit for mood and he reports that he is doing well on that.  Medical records since last visit indicate that patient was in the emergency room in August.  Patient had a viral URI at the time and felt confused and was having some trouble ambulating.  Patient recognized that  he was confused.  He was admitted to the hospital, managed with fluids and supportive care and symptoms resolved.  He is having back pain during personal training.  Started 2 weeks ago.   They want a referral to ortho.  Pain is in the L buttocks but when bad, goes across to the R as well.  Doesn't go down the leg.  They also ask about ST due to hypophonia.  Having visual distortion but no real hallucinations.  C/o "worsening dementia."  Current prescribed movement disorder medications: Carbidopa/levodopa 25/100, 1 tablet at 6 AM/10 AM/2 PM/6 PM Carbidopa/levodopa 50/200 CR at bedtime  Lexapro, 10 mg daily (started last visit)   PREVIOUS MEDICATIONS: Sinemet and Mirapex (weaned off because of delusions)  ALLERGIES:  No Known Allergies  CURRENT MEDICATIONS:  Outpatient Encounter Medications as of 09/21/2022  Medication Sig   Ascorbic Acid (VITAMIN C) 1000 MG tablet Take 1,000 mg by mouth daily.   atorvastatin (LIPITOR) 40 MG tablet Take 40 mg by mouth at bedtime.   carbidopa-levodopa (SINEMET CR) 50-200 MG tablet TAKE 1 TABLET BY MOUTH EVERYDAY AT BEDTIME   carbidopa-levodopa (SINEMET IR) 25-100 MG tablet TAKE 1 TABLET BY MOUTH 4 (FOUR) TIMES DAILY. 6AM/10AM/2PM/6PM   docusate sodium (STOOL SOFTENER) 100 MG capsule Take 100 mg by mouth 2 (two) times daily.   escitalopram (LEXAPRO) 10 MG tablet  TAKE 1 TABLET BY MOUTH EVERY DAY   Multiple Vitamin (MULTIVITAMIN) tablet Take 1 tablet by mouth daily. Men's health formula multi-mineral 50+   phenol (CHLORASEPTIC) 1.4 % LIQD Use as directed 1 spray in the mouth or throat as needed for throat irritation / pain.   tamsulosin (FLOMAX) 0.4 MG CAPS capsule Take 0.4 mg by mouth daily.   No facility-administered encounter medications on file as of 09/21/2022.    Objective:   PHYSICAL EXAMINATION:    VITALS:   Vitals:   09/21/22 0811  BP: 118/82  Pulse: 61  SpO2: 99%  Weight: 179 lb 9.6 oz (81.5 kg)  Height: 6' (1.829 m)      GEN:  The  patient appears stated age and is in NAD. HEENT:  Normocephalic, atraumatic.  The mucous membranes are moist. The superficial temporal arteries are without ropiness or tenderness. CV:  RRR Lungs:  CTAB Neck/HEME:  There are no carotid bruits bilaterally.  Neurological examination:  Orientation: The patient is alert and oriented x3. Cranial nerves: There is good facial symmetry with facial hypomimia. The speech is fluent and clear. Soft palate rises symmetrically and there is no tongue deviation. Hearing is intact to conversational tone. Sensation: Sensation is intact to light touch throughout Motor: Strength is at least antigravity x4.  Movement examination: Tone: There is mild increased tone in the LUE/RLE Abnormal movements: LUE rest tremor, mild to mod Coordination:  There is mild decremation with RAM's, with any form of RAMS, including alternating supination and pronation of the forearm, hand opening and closing, finger taps, heel taps and toe taps., L>R (same as previous) Gait and Station: The patient has no difficulty arising out of a deep-seated chair without the use of the hands.  Ambulates well    Total time spent on today's visit was 33 minutes, including both face-to-face time and nonface-to-face time.  Time included that spent on review of records (prior notes available to me/labs/imaging if pertinent), discussing treatment and goals, answering patient's questions and coordinating care.  Cc:  Lujean Amel, MD

## 2022-09-21 ENCOUNTER — Ambulatory Visit: Payer: Medicare HMO | Admitting: Neurology

## 2022-09-21 ENCOUNTER — Encounter: Payer: Self-pay | Admitting: Neurology

## 2022-09-21 VITALS — BP 118/82 | HR 61 | Ht 72.0 in | Wt 179.6 lb

## 2022-09-21 DIAGNOSIS — R413 Other amnesia: Secondary | ICD-10-CM

## 2022-09-21 DIAGNOSIS — G20A1 Parkinson's disease without dyskinesia, without mention of fluctuations: Secondary | ICD-10-CM

## 2022-09-21 DIAGNOSIS — G20A2 Parkinson's disease without dyskinesia, with fluctuations: Secondary | ICD-10-CM | POA: Diagnosis not present

## 2022-09-21 DIAGNOSIS — R498 Other voice and resonance disorders: Secondary | ICD-10-CM | POA: Diagnosis not present

## 2022-09-21 DIAGNOSIS — M545 Low back pain, unspecified: Secondary | ICD-10-CM

## 2022-09-21 MED ORDER — CARBIDOPA-LEVODOPA 25-100 MG PO TABS: ORAL_TABLET | ORAL | 2 refills | Status: DC

## 2022-09-21 NOTE — Patient Instructions (Addendum)
Increase carbidopa/levodopa 25/100, 2 tablet at 6 AM/ and 1 at 10 AM/2 PM/6 PM  You have been referred for a neurocognitive evaluation (i.e., evaluation of memory and thinking abilities). Please bring someone with you to this appointment if possible, as it is helpful for the neuropsychologist to hear from both you and another adult who knows you well. Please bring eyeglasses and hearing aids if you wear them and take any medications as you normally would.    The evaluation will take approximately 2-3 hours and has two parts:   The first part is a clinical interview with the neuropsychologist, Dr. Melvyn Novas.  During the interview, the neuropsychologist will speak with you and the individual you brought to the appointment.    The second part of the evaluation is testing with the doctor's technician, aka psychometrician, Hinton Dyer or [employer]. During the testing, the technician will ask you to remember different types of material, solve problems, and answer some questionnaires. Your family member will not be present for this portion of the evaluation.   Please note: We have to reserve several hours of the neuropsychologist's time and the psychometrician's time for your evaluation appointment. As such, there is a No-Show fee of $100. If you are unable to attend any of your appointments, please contact our office as soon as possible to reschedule.   Local and Online Resources for Power over Parkinson's Group  November 2023    LOCAL [place] PARKINSON'S GROUPS   Power over Parkinson's Group:    Power Over Parkinson's Patient Education Group will be Wednesday, November 8th-*Hybrid meting*- in person at [facility] location and via [facility], 2:00-3:00 pm.   Starting in November, Power over [employer] and Care Partner Groups will meet together, with plans for separate break out session for caregivers (*this will be evolving over the next few months) Upcoming Power over Parkinson's Meetings/Care Partner  Support:  2nd Wednesdays of the month at 2 pm:   November 8th, December 13th  [place] at [email or web address] if interested in participating in this group    [place] and Fall Prevention Workshop.  Thursday, November 9th 1-2pm, [employer], [employer].  Register with Vonna Kotyk at [email or web address] or [phone] New PWR! Moves [employer] Instructor-Led Classes offering at [employer]!  TUESDAYS and Wednesdays 1-2 pm.   Contact Vonna Kotyk at  [email or web address]  or [phone] (Tuesday classes are modified for chair and standing only) Dance for Parkinson 's classes will be on Tuesdays 9:30am-10:30am starting October 3-December 12 with a break the week of November 21st. Located in the [employer] , in the first floor of the [employer] ([place] To register:  [email or web address] or [phone]  Drumming for Parkinson's will be held on 2nd and 4th Mondays at 11:00 am.   Located at the [place]  [place] at [email or web address] or [phone]  Through support from the [place] for Parkinson's classes are free for both patients and caregivers.    [employer] Parkinson's Tai Chi Class, Mondays at 11 am.  Call [phone] for details Parkinson's Holiday Party.  Wednesday, December 6th, 4:00-5:00 pm.  [facility] and Fitness.  RSVP to Garnetta Buddy at [phone] or [email or web address]   [place]:  [email or web address]  PD Health at Home continues:  Mindfulness Mondays, Wellness Wednesdays, Fitness Fridays   Upcoming Education:   Why  Should you Participate in Parkinson's Research?  Wednesday, Nov. 29th,  1-2 pm  Expert Briefing:    Hallucinations and Delusions in Parkinson's.  Wednesday, Nov.  8th, 1-2 pm  Register for expert briefings (webinars) at [email or web address]  Please check out their website to sign up for emails and see their full online offerings      [place]:  [email or web address]   Third Thursday Webinars:  On the third Thursday of every month at 12 p.m. ET, join our free live webinars to learn about various aspects of living with Parkinson's disease and our work to speed medical breakthroughs.  Upcoming Webinar:  A Year Like No Other in Parkinson's Research:  2023 in Review.  Thursday, November 16th 12 noon. Check out additional information on their website to see their full online offerings    [facility]:  [email or web address]  Upcoming Webinar:   Stay tuned  Webinar Series:  Living with Parkinson's Meetup.   Third Thursdays each month, 3 pm  Care Partner Monthly Meetup.  With Robin Searing Phinney.  First Tuesday of each month, 2 pm  Check out additional information to Live Well Today on their website    Parkinson and Movement Disorders (PMD) Alliance:  www.[email or web address]  [employer]:  Online Education Events  Sign up for emails, which are sent weekly to give you updates on programming and online offerings    Parkinson's Association of the [place]:  www.[email or web address]  Information on online support groups, education events, and online exercises including Yoga, Parkinson's exercises and more-LOTS of information on links to PD resources and online events  Virtual Support Group through Parkinson's Association of the [place]; next one is scheduled for Wednesday, November 1st  at 2 pm.  (These are typically scheduled for the 1st Wednesday of the month at 2 pm).  Visit website for details.   MOVEMENT AND EXERCISE OPPORTUNITIES  PWR! Moves Classes at [place].  Wednesdays 10 and 11 am.   Contact Amy Marriott, PT  [email or web address] if interested.  NEW PWR! Moves Class offerings at [employer].  *TUESDAYS* and Wednesdays 1-2 pm.  Contact Vonna Kotyk at  [email or web address]    Parkinson's Wellness Recovery (PWR! Moves)  [email or web address]  Info on the PWR! Virtual Experience:  You will have access to our expertise?through self-assessment, guided plans that start with the PD-specific fundamentals, educational content, tips, Q&A with an expert, and a growing Art therapist of PD-specific pre-recorded and live exercise classes of varying types and intensity - both physical and cognitive! If that is not enough, we offer 1:1 wellness consultations (in-person or virtual) to personalize your PWR! Research scientist (medical).   [place] Fridays:   As part of the PD Health @ Home program, this free video series focuses each week on one aspect of fitness designed to support people living with Parkinson's.? These weekly videos highlight the [place] fitness guidelines for people with Parkinson's disease.  [email or web address]   Dance for PD website is offering free, live-stream classes throughout the week, as well as links to [employer] of classes:  [email or web address]  Virtual dance and Pilates for Parkinson's classes: Click on the Community Tab> Parkinson's Movement Initiative Tab.  To register for classes and for more information, visit [email or web address] and click the "community" tab.   [employer] Parkinson's Cycling Classes   [employer]:  Thursdays @ Noon-Live classes at [employer] ([employer] at [email or web address]?or [phone])  [facility] [employer]: Virtual Classes Mondays and Thursdays Jeanette Caprice classes Tuesday, Wednesday and Thursday (contact  [place] at [email or web address] ?or [phone])  [place]  Varied levels of classes are offered Tuesdays and  Thursdays at [employer].   Stretching with Verdis Frederickson weekly class is also offered for people with Parkinson's  To observe a class or for more information, call [phone] or email Hezzie Bump at [email or web address]   ADDITIONAL SUPPORT AND RESOURCES  [employer]:Online Caregiver Education Opportunities:  www.well-[email or web address]/caregiver-education/[email or web address]-support-group.  You may also contact Vickki Muff at [email or web address] or [phone].     Well-Spring Navigator:  Just1Navigator program, a?free service to help individuals and families through the journey of determining care for older adults.  The "Navigator" is a Education officer, museum, Arnell Asal, who will speak with a prospective client and/or loved ones to provide an assessment of the situation and a set of recommendations for a personalized care plan -- all free of charge, and whether?[employer] offers the needed service or not. If the need is not a service we provide, we are well-connected with reputable programs in town that we can refer you to.  www.well-[email or web address] or to speak with the Navigator, call [phone].

## 2022-09-22 DIAGNOSIS — M5432 Sciatica, left side: Secondary | ICD-10-CM | POA: Diagnosis not present

## 2022-09-22 DIAGNOSIS — M9903 Segmental and somatic dysfunction of lumbar region: Secondary | ICD-10-CM | POA: Diagnosis not present

## 2022-10-03 DIAGNOSIS — M5432 Sciatica, left side: Secondary | ICD-10-CM | POA: Diagnosis not present

## 2022-10-03 DIAGNOSIS — M9903 Segmental and somatic dysfunction of lumbar region: Secondary | ICD-10-CM | POA: Diagnosis not present

## 2022-10-05 ENCOUNTER — Ambulatory Visit: Payer: Medicare HMO

## 2022-10-05 ENCOUNTER — Other Ambulatory Visit: Payer: Self-pay

## 2022-10-05 ENCOUNTER — Ambulatory Visit: Payer: Medicare HMO | Attending: Neurology

## 2022-10-05 DIAGNOSIS — R262 Difficulty in walking, not elsewhere classified: Secondary | ICD-10-CM | POA: Diagnosis present

## 2022-10-05 DIAGNOSIS — R2689 Other abnormalities of gait and mobility: Secondary | ICD-10-CM | POA: Insufficient documentation

## 2022-10-05 DIAGNOSIS — R2681 Unsteadiness on feet: Secondary | ICD-10-CM | POA: Diagnosis present

## 2022-10-05 DIAGNOSIS — M6281 Muscle weakness (generalized): Secondary | ICD-10-CM | POA: Insufficient documentation

## 2022-10-05 DIAGNOSIS — R1310 Dysphagia, unspecified: Secondary | ICD-10-CM

## 2022-10-05 DIAGNOSIS — R471 Dysarthria and anarthria: Secondary | ICD-10-CM

## 2022-10-05 DIAGNOSIS — M545 Low back pain, unspecified: Secondary | ICD-10-CM | POA: Diagnosis not present

## 2022-10-05 DIAGNOSIS — G20A2 Parkinson's disease without dyskinesia, with fluctuations: Secondary | ICD-10-CM | POA: Insufficient documentation

## 2022-10-05 DIAGNOSIS — M5459 Other low back pain: Secondary | ICD-10-CM | POA: Insufficient documentation

## 2022-10-05 NOTE — Therapy (Signed)
OUTPATIENT SPEECH LANGUAGE PATHOLOGY PARKINSON'S EVALUATION   Patient Name: Jeremy Davidson MRN: [id number] DOB:1951-02-24, 71 y.o., male Today's Date: 10/06/2022  PCP: Darrow Bussing, MD REFERRING PROVIDER: Kerin Salen, DO  END OF SESSION:  End of Session - 10/06/22 0916     Visit Number 1    Number of Visits 25    Date for SLP Re-Evaluation 01/03/23    SLP Start Time 1233    SLP Stop Time  1315    SLP Time Calculation (min) 42 min    Activity Tolerance Patient tolerated treatment well             Past Medical History:  Diagnosis Date   Blood in stool 2010   Colonoscopy benign repeat 2015   Elevated cholesterol    Hypertension    Parkinson's disease 03/17/2017   Past Surgical History:  Procedure Laterality Date   HERNIA REPAIR  06/23/2010   With large ultra pro hrnia system-- Dr Dwain Sarna 1971. x2   Patient Active Problem List   Diagnosis Date Noted   Viral URI with cough 05/23/2022   Acute metabolic encephalopathy 05/23/2022   Difficulty walking 05/23/2022   Hyponatremia 05/23/2022   Parkinson's disease 03/17/2017   Essential hypertension 09/09/2015   Hyperlipidemia 09/09/2015    ONSET DATE:script dated 09-21-22  REFERRING DIAG: G20.A2 (ICD-10-CM) - Parkinson's disease without dyskinesia, with fluctuating manifestations R49.8 (ICD-10-CM) - Hypophonia THERAPY DIAG:  Dysarthria and anarthria  Rationale for Evaluation and Treatment: Rehabilitation  SUBJECTIVE:   SUBJECTIVE STATEMENT: "My family says I'm speaking soft, so I say it again and I'm loud but by the time I get to the end I'm soft again." "My thinking seems slower than it was too." Pt accompanied by: self  PERTINENT HISTORY:   71 year old male with a history of Parkinson's disease, hypertension, hyperlipidemia. He presented to ED on 05-22-22 with confusion and difficulty walking and was admitted  for viral URI. At visit with Dr. Arbutus Leas on 09-21-22 there were c/o "worsening dementia."   PAIN:   Are you having pain? No  FALLS: Has patient fallen in last 6 months?  See PT evaluation for details  LIVING ENVIRONMENT: Lives with: lives with their spouse Lives in: House/apartment  PLOF:  Level of assistance: Independent with ADLs Employment: Retired  PATIENT GOALS: Improve conversational volume  OBJECTIVE:   COGNITION: Overall cognitive status: Impaired Areas of impairment: Memory which was noted this during evaluation but will not be evaled today due to time constraints. Pt also mentions fine motor control problems so OT evaluation was suggested.  MOTOR SPEECH: Overall motor speech: impaired Level of impairment: Phrase Respiration: thoracic breathing and clavicular breathing Phonation: low vocal intensity Resonance: WFL Articulation: Appears intact Intelligibility: Intelligibility reduced Motor planning: Appears intact Effective technique: increased vocal intensity  ORAL MOTOR EXAMINATION: Overall status: Impaired:   Labial: Bilateral (Coordination) Lingual: Bilateral (Coordination) Comments: lt lateral lingual strength reduced compared to rt   OBJECTIVE VOICE ASSESSMENT: Sustained "ah" loudness average: 63 dB, range 58-67 dB Oral reading (passage) loudness average: 61 dB Oral reading loudness range: 54-65 dB Conversational loudness average: 62 dB (below WNL) Conversational loudness range: 56-66 dB (below WNL) Voice quality: rough and low vocal intensity ; roughness likely due to decr'd breath support Stimulability trials: Given SLP modeling and occasional min cues, loudness average increased to upper 60s - low 70s dB (range of 64 to 74) at paragraph reading level.  Comments: SLP recorded pt's paragraph reading and then paragraph reading after loud /a/ with directions to  use as much effort as with loud /a/ and volume increased to upper 60s - low 70s dB. Pt remarked, "That's louder!" When he heard this.  Completed audio recording of patients baseline voice without  cueing from SLP: Yes  Pt does report difficulty with swallowing which does warrant further evaluation.Bedside swallow eval to be completed next 1-2 sessions  PATIENT REPORTED OUTCOME MEASURES (PROM): Communication Effectiveness Survey: to be completed in next 1-2 visits  TODAY'S TREATMENT:                                                                                                                                         DATE:  10/05/22: Discussed results of today's evaluation, taught pt loud /a/ and reviewed handout on how to accomplish loud /a/ at home. SLP explained bedside swallow eval to take place next session and basics of MBS.  PATIENT EDUCATION: Education details: See "today's treatment" Person educated: Patient Education method: Explanation, Demonstration, Verbal cues, and Handouts Education comprehension: verbalized understanding, returned demonstration, verbal cues required, and needs further education  HOME EXERCISE PROGRAM: Loud /a/ x10, BID  GOALS: Goals reviewed with patient? Yes   SHORT TERM GOALS: Target date: 11/11/22   Pt will undergo bedside swallow evaluation and subsequent MBS if clinically indicated Baseline: Goal status: INITIAL   2.  Pt will demo loud sustained /a/ or "Hey - ahhhhhh" with upper80s dB x4 sessions  Baseline:  Goal status: INITIAL   3.  Pt will demo low 70s dB average in 18/20 sentence responses in 2 sessions Baseline:  Goal status: INITIAL  4.  Pt will demo low 70s dB average in 3 minutes simple conversation Baseline:  Goal status: INITIAL     LONG TERM GOALS: Target date: 01/03/23   Pt will report higher/better PROM scores than initial session Baseline:  Goal status: INITIAL   2.  Pt will demo WNL volume (low 70s dB average) in 10 minutes simple to mod complex conversation with modified independence (external cues PRN) x3 sessions Baseline:  Goal status: INITIAL   3.  Pt will complete homework (HEP) for at least 7  consecutive days Baseline:  Goal status: INITIAL  ASSESSMENT:  CLINICAL IMPRESSION: Patient is a 71 y.o. male who was seen today for assessment of dysarthria in light of Parkinson's disease. He also reports swallowing difficulties which will be assessed next 1-2 sessions and MBS suggested PRN. Swallowing goals will be added PRN. Lc expresses frustration at an inability to improve his loudness in conversation and thus his overall communication ability with family as reported in "s" statement.   OBJECTIVE IMPAIRMENTS: Objective impairments include attention, memory, dysarthria, and dysphagia. These impairments are limiting patient from household responsibilities, ADLs/IADLs, effectively communicating at home and in community, and safety when swallowing.Factors affecting potential to achieve goals and functional outcome are ability to learn/carryover information.. Patient will benefit from skilled SLP services to address above  impairments and improve overall function.  REHAB POTENTIAL: Good  PLAN:  SLP FREQUENCY: 2x/week  SLP DURATION: 12 weeks  PLANNED INTERVENTIONS: Aspiration precaution training, Pharyngeal strengthening exercises, Diet toleration management , Environmental controls, Trials of upgraded texture/liquids, Cueing hierachy, Cognitive reorganization, Internal/external aids, Functional tasks, SLP instruction and feedback, Compensatory strategies, and Patient/family education    [facility], CCC-SLP 10/06/2022, 9:50 AM

## 2022-10-05 NOTE — Patient Instructions (Signed)
   Normal volume is 70-72 dB, today you arrived speaking at about 62 dB in conversation. You read at about 62dB.  You did some loud "ahhhhhhhhhhhhhhhh" for me and said  your effort was 7/10. (10=most effort possible) Afterwards I said to use as much effort as you did with the loud "ahhhhhh" and read the same passage. You read the passage at average 69dB.  We will work on you increasing your loudness/effort when talking.  PLEASE DO 10 LOUD "[place]" twice each day.  Take a breath, say "AHHHHHHHH" as long as you can. When you feel like you need to take another breath, STOP.

## 2022-10-05 NOTE — Therapy (Signed)
OUTPATIENT PHYSICAL THERAPY NEURO EVALUATION   Patient Name: Jeremy Davidson MRN: [id number] DOB:18-Aug-1951, 71 y.o., male Today's Date: 10/05/2022   PCP: Lujean Amel, MD REFERRING PROVIDER: Ludwig Clarks, DO  END OF SESSION:  PT End of Session - 10/05/22 1129     Visit Number 1    Number of Visits 6    Date for PT Re-Evaluation 11/23/22    Authorization Type [employer]    Progress Note Due on Visit 10    PT Start Time 1135    PT Stop Time 1225    PT Time Calculation (min) 50 min             Past Medical History:  Diagnosis Date   Blood in stool 2010   Colonoscopy benign repeat 2015   Elevated cholesterol    Hypertension    Parkinson's disease 03/17/2017   Past Surgical History:  Procedure Laterality Date   HERNIA REPAIR  06/23/2010   With large ultra pro hrnia system-- Dr Donne Hazel 1971. x2   Patient Active Problem List   Diagnosis Date Noted   Viral URI with cough 44/31/5400   Acute metabolic encephalopathy 86/76/1950   Difficulty walking 05/23/2022   Hyponatremia 05/23/2022   Parkinson's disease 03/17/2017   Essential hypertension 09/09/2015   Hyperlipidemia 09/09/2015    ONSET DATE: 6-7 years for PD and LBP flare-up a couples  REFERRING DIAG: M54.50 (ICD-10-CM) - Acute left-sided low back pain without sciatica G20.A2 (ICD-10-CM) - Parkinson's disease without dyskinesia, with fluctuating manifestations  THERAPY DIAG:  Difficulty in walking, not elsewhere classified  Muscle weakness (generalized)  Other abnormalities of gait and mobility  Unsteadiness on feet  Other low back pain  Rationale for Evaluation and Treatment: Rehabilitation  SUBJECTIVE:                                                                                                                                                                                             SUBJECTIVE STATEMENT: Been fighting with PD for 6-7 years, notes that he feels a bit slower lately and  has had a recent acute episode of low back pain left side > right which is overall improving and has been seeing chiropractor for this and notes feeling improved.  Participates in 3-4 PD specific classes/week  Pt accompanied by: self  PERTINENT HISTORY: PD  PAIN:  Are you having pain? Yes: NPRS scale: 5/10 Pain location: left buttocks/lumbar Pain description: aching, stabbing, sharp Aggravating factors: worse with bending Relieving factors: chiropractor, spray-on pain relief  PRECAUTIONS: None  WEIGHT BEARING RESTRICTIONS: No  FALLS: Has patient fallen in last 6 months?  No  LIVING ENVIRONMENT: Lives with: lives with their family Lives in: [place], town home Stairs: Yes: Internal: flight steps; on right going up and on left going up Has following equipment at home: None  PLOF: Independent and Independent with community mobility without device  PATIENT GOALS: get rid of back pain, just check it out. Never gone through much PT for PD  OBJECTIVE:   DIAGNOSTIC FINDINGS:   COGNITION: Overall cognitive status: Impaired  Short-term recall limited SENSATION: WFL  COORDINATION: Slow alternating movements. More deficits with dissociative movements Unable to coordinate double foot hop Heel to shin moderately impaired left > right Finger to nose WNL Finger opposition: unable to perform bilaterally  EDEMA:    MUSCLE TONE: NT  MUSCLE LENGTH: Bilateral hamstring tightness evident by 15-20 degree knee flexion when performing forward trunk flexion ROM  DTRs:    POSTURE: rounded shoulders and forward head  LOWER EXTREMITY ROM:     Active  Right Eval Left Eval  Hip flexion    Hip extension    Hip abduction    Hip adduction    Hip internal rotation    Hip external rotation    Knee flexion    Knee extension    Ankle dorsiflexion 5 5  Ankle plantarflexion    Ankle inversion    Ankle eversion     (Blank rows = not tested)  Lumbar spine ROM: -pain with  lumbar flexion, unable to maintain straight legs, pain with end-range flexion and then grabbing pain in lumbar as he arises   -RLE SLR approx 50-70 degrees no pain in buttocks LLE SLR approx 30-45 degrees with pain in buttocks  LOWER EXTREMITY MMT:    Grossly 5/5 BLE. Able to perform 10+ sit-ups with BLE support Palpation to left pririformis unrevealing, assessment of L-spine in prone unrevealing for gross abnormality  BED MOBILITY:    TRANSFERS: Assistive device utilized: None  Sit to stand: Complete Independence Stand to sit: Complete Independence Chair to chair: Complete Independence Floor:  NT  RAMP:   CURB:  NT  STAIRS: NT  GAIT: Gait pattern:  short pattern, reduced trunk/pelvic rotation and decreased stride length, limited reciprocal arm swing, decreased hip extension Distance walked:  Assistive device utilized: None Level of assistance: Complete Independence Comments:   FUNCTIONAL TESTS:  10 meter walk test: 9 sec; 3.64 ft/sec Mini-BEST : 25/28  M-CTSIB  Condition 1: Firm Surface, EO 30 Sec, Normal Sway  Condition 2: Firm Surface, EC 30 Sec, Mild Sway  Condition 3: Foam Surface, EO 30 Sec, Mild Sway  Condition 4: Foam Surface, EC 30 Sec, Mild and Moderate Sway     PATIENT SURVEYS:    TODAY'S TREATMENT:                                                                                                                              DATE: see HEP    PATIENT EDUCATION: Education details: PD specific intervention rationale,  LBP PT recommendation Person educated: Patient Education method: Explanation Education comprehension: verbalized understanding  HOME EXERCISE PROGRAM: Access Code: [email or web address] URL: [email or web address] Date: 10/05/2022 Prepared by: Sherlyn Lees  Exercises - Supine Lower Trunk Rotation  - 1 x daily - 7 x weekly - 3 sets - 10 reps - Hooklying Single Knee to Chest  - 1 x daily - 7 x weekly - 3 sets - 10 reps - Supine  Single Knee to Chest  - 1 x daily - 7 x weekly - 3 sets - 10 reps - Supine Double Knee to Chest  - 1 x daily - 7 x weekly - 3 sets - 10 reps  GOALS: Goals reviewed with patient? Yes  SHORT TERM GOALS: Target date: 10/26/2022    Patient will be independent in HEP to improve functional outcomes Baseline: Goal status: INITIAL    LONG TERM GOALS: Target date: 11/16/2022    Patient will report low back pain not exceeding 2/10 with forward flexion/exercise routine to improve activity tolerance Baseline: 5/10 LBP Goal status: INITIAL  2.  Demonstrate TUG cognitive <10% difference from TUG to manifest improved coordination and dual-tasking to reduce imbalance Baseline: regular TUG 9 sec, TUG with cognitive dual-task 15 sec Goal status: INITIAL    ASSESSMENT:  CLINICAL IMPRESSION: Patient is a 71 y.o. man who was seen today for physical therapy evaluation and treatment for Parkinson's Disease and acute low back pain.  Demonstrates limited lumbar flexion ROM/tolerance with onset of pain limiting movements and activities. Also presenting with deficits and impairments related to PD including gait deviations, motor planning/coordination, and evidence of imbalance indicating need for PT Services to intervene and provide strategies for self-mgmt and reduce sequelae and/or complication.   OBJECTIVE IMPAIRMENTS: Abnormal gait, decreased activity tolerance, decreased balance, decreased cognition, decreased coordination, decreased ROM, impaired flexibility, improper body mechanics, postural dysfunction, and pain.   ACTIVITY LIMITATIONS: carrying, lifting, bending, standing, squatting, and locomotion level  PARTICIPATION LIMITATIONS: meal prep, cleaning, and community activity  PERSONAL FACTORS: Age, Time since onset of injury/illness/exacerbation, and 1 comorbidity: back pain  are also affecting patient's functional outcome.   REHAB POTENTIAL: Excellent  CLINICAL DECISION MAKING:  Evolving/moderate complexity  EVALUATION COMPLEXITY: Moderate  PLAN:  PT FREQUENCY: 1x/week  PT DURATION: 6 weeks  PLANNED INTERVENTIONS: Therapeutic exercises, Therapeutic activity, Neuromuscular re-education, Balance training, Gait training, Patient/Family education, Self Care, Joint mobilization, Joint manipulation, Stair training, Vestibular training, Canalith repositioning, DME instructions, Aquatic Therapy, Dry Needling, Electrical stimulation, Spinal manipulation, Spinal mobilization, Cryotherapy, Moist heat, Traction, and Ionotophoresis '4mg'$ /ml Dexamethasone, manual therapy  PLAN FOR NEXT SESSION: HEP review for back pain, reciprocal/amplitude arm swing with various gait activities, motor dual-task (e.g. walk and ball toss, bounce, etc)   12:55 PM, 10/05/22 M. Sherlyn Lees, PT, DPT Physical Therapist- [place] Office Number: [phone]

## 2022-10-06 ENCOUNTER — Other Ambulatory Visit: Payer: Self-pay

## 2022-10-06 DIAGNOSIS — G20A1 Parkinson's disease without dyskinesia, without mention of fluctuations: Secondary | ICD-10-CM

## 2022-10-12 ENCOUNTER — Encounter: Payer: Self-pay | Admitting: Neurology

## 2022-10-24 DIAGNOSIS — R1319 Other dysphagia: Secondary | ICD-10-CM | POA: Diagnosis not present

## 2022-10-24 DIAGNOSIS — M5432 Sciatica, left side: Secondary | ICD-10-CM | POA: Diagnosis not present

## 2022-10-24 DIAGNOSIS — M9903 Segmental and somatic dysfunction of lumbar region: Secondary | ICD-10-CM | POA: Diagnosis not present

## 2022-10-24 DIAGNOSIS — G20B1 Parkinson's disease with dyskinesia, without mention of fluctuations: Secondary | ICD-10-CM | POA: Diagnosis not present

## 2022-10-27 ENCOUNTER — Ambulatory Visit: Payer: Medicare HMO | Attending: Neurology | Admitting: Physical Therapy

## 2022-10-27 ENCOUNTER — Ambulatory Visit: Payer: Medicare HMO

## 2022-10-27 ENCOUNTER — Encounter: Payer: Self-pay | Admitting: Physical Therapy

## 2022-10-27 DIAGNOSIS — M6281 Muscle weakness (generalized): Secondary | ICD-10-CM | POA: Insufficient documentation

## 2022-10-27 DIAGNOSIS — R2689 Other abnormalities of gait and mobility: Secondary | ICD-10-CM | POA: Insufficient documentation

## 2022-10-27 DIAGNOSIS — R2681 Unsteadiness on feet: Secondary | ICD-10-CM

## 2022-10-27 NOTE — Therapy (Deleted)
OUTPATIENT SPEECH LANGUAGE PATHOLOGY PARKINSON'S TREATMENT   Patient Name: Jeremy Davidson MRN: [id number] DOB:May 24, 1951, 72 y.o., male Today's Date: 10/27/2022  PCP: Lujean Amel, MD REFERRING PROVIDER: Alonza Bogus, DO  END OF SESSION:    Past Medical History:  Diagnosis Date   Blood in stool 2010   Colonoscopy benign repeat 2015   Elevated cholesterol    Hypertension    Parkinson's disease 03/17/2017   Past Surgical History:  Procedure Laterality Date   HERNIA REPAIR  06/23/2010   With large ultra pro hrnia system-- Dr Donne Hazel 1971. x2   Patient Active Problem List   Diagnosis Date Noted   Viral URI with cough 123XX123   Acute metabolic encephalopathy [id number]   Difficulty walking 05/23/2022   Hyponatremia 05/23/2022   Parkinson's disease 03/17/2017   Essential hypertension 09/09/2015   Hyperlipidemia 09/09/2015    ONSET DATE:script dated 09-21-22  REFERRING DIAG: G20.A2 (ICD-10-CM) - Parkinson's disease without dyskinesia, with fluctuating manifestations R49.8 (ICD-10-CM) - Hypophonia  THERAPY DIAG:  No diagnosis found.  Rationale for Evaluation and Treatment: Rehabilitation  SUBJECTIVE:   SUBJECTIVE STATEMENT: "My family says I'm speaking soft, so I say it again and I'm loud but by the time I get to the end I'm soft again." "My thinking seems slower than it was too." Pt accompanied by: self  PERTINENT HISTORY:   72 year old male with a history of Parkinson's disease, hypertension, hyperlipidemia. He presented to ED on 05-22-22 with confusion and difficulty walking and was admitted  for viral URI. At visit with Dr. Carles Collet on 09-21-22 there were c/o "worsening dementia."   PAIN:  Are you having pain? No  FALLS: Has patient fallen in last 6 months?  See PT evaluation for details  PATIENT GOALS: Improve conversational volume  OBJECTIVE:   TODAY'S TREATMENT:                                                                                                                                          DATE:   10/28/22:   10/05/22: Discussed results of today's evaluation, taught pt loud /a/ and reviewed handout on how to accomplish loud /a/ at home. SLP explained bedside swallow eval to take place next session and basics of MBS.  PATIENT EDUCATION: Education details: See "today's treatment" Person educated: Patient Education method: Explanation, Demonstration, Verbal cues, and Handouts Education comprehension: verbalized understanding, returned demonstration, verbal cues required, and needs further education  HOME EXERCISE PROGRAM: Loud /a/ x10, BID  GOALS: Goals reviewed with patient? Yes   SHORT TERM GOALS: Target date: 11/11/22   Pt will undergo bedside swallow evaluation and subsequent MBS if clinically indicated Baseline: Goal status: ONGOING    2.  Pt will demo loud sustained /a/ or "Hey - ahhhhhh" with upper80s dB x4 sessions  Baseline:  Goal status: ONGOING    3.  Pt will demo low 70s dB average in 18/20 sentence  responses in 2 sessions Baseline:  Goal status: ONGOING   4.  Pt will demo low 70s dB average in 3 minutes simple conversation Baseline:  Goal status: ONGOING      LONG TERM GOALS: Target date: 01/03/23   Pt will report higher/better PROM scores than initial session Baseline:  Goal status: ONGOING    2.  Pt will demo WNL volume (low 70s dB average) in 10 minutes simple to mod complex conversation with modified independence (external cues PRN) x3 sessions Baseline:  Goal status: ONGOING    3.  Pt will complete homework (HEP) for at least 7 consecutive days Baseline:  Goal status: ONGOING   ASSESSMENT:  CLINICAL IMPRESSION: Patient is a 72 y.o. male who was seen today for assessment of dysarthria in light of Parkinson's disease. He also reports swallowing difficulties which will be assessed next 1-2 sessions and MBS suggested PRN. Swallowing goals will be added PRN. Jeremy Davidson expresses frustration at an  inability to improve his loudness in conversation and thus his overall communication ability with family as reported in "s" statement.   OBJECTIVE IMPAIRMENTS: Objective impairments include attention, memory, dysarthria, and dysphagia. These impairments are limiting patient from household responsibilities, ADLs/IADLs, effectively communicating at home and in community, and safety when swallowing.Factors affecting potential to achieve goals and functional outcome are ability to learn/carryover information.. Patient will benefit from skilled SLP services to address above impairments and improve overall function.  REHAB POTENTIAL: Good  PLAN:  SLP FREQUENCY: 2x/week  SLP DURATION: 12 weeks  PLANNED INTERVENTIONS: Aspiration precaution training, Pharyngeal strengthening exercises, Diet toleration management , Environmental controls, Trials of upgraded texture/liquids, Cueing hierachy, Cognitive reorganization, Internal/external aids, Functional tasks, SLP instruction and feedback, Compensatory strategies, and Patient/family education    Marzetta Board, CCC-SLP 10/27/2022, 2:48 PM

## 2022-10-27 NOTE — Therapy (Signed)
OUTPATIENT PHYSICAL THERAPY NEURO TREATMENT NOTE   Patient Name: Jeremy Davidson MRN: [id number] DOB:1951/06/27, 72 y.o., male Today's Date: 10/27/2022   PCP: Lujean Amel, MD REFERRING PROVIDER: Ludwig Clarks, DO  END OF SESSION:  PT End of Session - 10/27/22 1450     Visit Number 2    Number of Visits 6    Date for PT Re-Evaluation 11/23/22    Authorization Type [employer]    Progress Note Due on Visit 10    PT Start Time 1448    PT Stop Time 1530    PT Time Calculation (min) 42 min    Activity Tolerance Patient tolerated treatment well    Behavior During Therapy [facility] for tasks assessed/performed              Past Medical History:  Diagnosis Date   Blood in stool 2010   Colonoscopy benign repeat 2015   Elevated cholesterol    Hypertension    Parkinson's disease 03/17/2017   Past Surgical History:  Procedure Laterality Date   HERNIA REPAIR  06/23/2010   With large ultra pro hrnia system-- Dr Donne Hazel 1971. x2   Patient Active Problem List   Diagnosis Date Noted   Viral URI with cough 94/49/6759   Acute metabolic encephalopathy 16/38/4665   Difficulty walking 05/23/2022   Hyponatremia 05/23/2022   Parkinson's disease 03/17/2017   Essential hypertension 09/09/2015   Hyperlipidemia 09/09/2015    ONSET DATE: 6-7 years for PD and LBP flare-up a couples  REFERRING DIAG: M54.50 (ICD-10-CM) - Acute left-sided low back pain without sciatica G20.A2 (ICD-10-CM) - Parkinson's disease without dyskinesia, with fluctuating manifestations  THERAPY DIAG:  Other abnormalities of gait and mobility  Muscle weakness (generalized)  Unsteadiness on feet  Rationale for Evaluation and Treatment: Rehabilitation  SUBJECTIVE:                                                                                                                                                                                             SUBJECTIVE STATEMENT: Feel overall slower,  unsteadiness, tired all the time, some confusion lately.    Back not really bothering me today, but picking up things from the floor is hard on my back. Pt accompanied by: self  PERTINENT HISTORY: PD  PAIN:  Are you having pain? No real pain  PRECAUTIONS: None  WEIGHT BEARING RESTRICTIONS: No  FALLS: Has patient fallen in last 6 months? No  LIVING ENVIRONMENT: Lives with: lives with their family Lives in: [place], town home Stairs: Yes: Internal: flight steps; on right going up and on left going up Has following equipment at  home: None  PLOF: Independent and Independent with community mobility without device  PATIENT GOALS: get rid of back pain, just check it out. Never gone through much PT for PD  OBJECTIVE:   TODAY'S TREATMENT: 10/27/2022 Activity Comments  Reviewed SKTC, DKTC, and trunk rotation as HEP given last visit Reviewed demo understanding.  Reminded patient to do these exercises daily.  Bed mobility from mat surface, working on supine>sidelying>sit; performed in reverse for sit>sidelying>supine, x 5 reps Cues for log roll, as pt initially comes up from supine>long sit and c/o back pain.  With log rolling, pt does not have c/o pain.  Sit<>stand 2 x 5 reps, cues for quad/glut activation for full upright stand, 3 sec hold time   Standing wide BOS lumbar stretch ant/post direction, 5 reps Consistent cues for tactile and demo cues  Minisquats to pick up target from floor, 2 x 5 reps Cues for neutral spine, no c/o pain  Gait training with focus on increased stride length-PT counts steps over 50 ft distance, multiple reps-pt takes 28 steps.  With attention to increased stride length, pt takes 19-20 steps Also provided cues for upright posture, relaxed arm swing.  Resisted gait in parallel bars, then in gym area, with blue theraband Cues for posture, increased intensity of movement pattern  Vitals at end of session; pt reported dizziness, resolves in sitting 117/74    Access Code: [email or web address] URL: [email or web address] Date: 10/27/2022 Prepared by: [facility]  Program Notes Walk along your hallway-take LONG STRIDES, Look ahead, and relax your arms.  Do this  5-6 reps.  Exercises - Supine Lower Trunk Rotation  - 1 x daily - 7 x weekly - 3 sets - 10 reps - Hooklying Single Knee to Chest  - 1 x daily - 7 x weekly - 3 sets - 10 reps - Supine Single Knee to Chest  - 1 x daily - 7 x weekly - 3 sets - 10 reps - Supine Double Knee to Chest  - 1 x daily - 7 x weekly - 3 sets - 10 reps  PATIENT EDUCATION: Education details: Intensity of movement patternsfor sit<>stand, squats, gait; optimal body mechanics for squats, gait Person educated: Patient Education method: Consulting civil engineer, Demonstration, Verbal cues, and Handouts Education comprehension: verbalized understanding, returned demonstration, verbal cues required, and needs further education  ------------------------------------------------------------------------------------------------- Objective measures below taken at initial eval:  DIAGNOSTIC FINDINGS:   COGNITION: Overall cognitive status: Impaired  Short-term recall limited SENSATION: WFL  COORDINATION: Slow alternating movements. More deficits with dissociative movements Unable to coordinate double foot hop Heel to shin moderately impaired left > right Finger to nose WNL Finger opposition: unable to perform bilaterally  EDEMA:    MUSCLE TONE: NT  MUSCLE LENGTH: Bilateral hamstring tightness evident by 15-20 degree knee flexion when performing forward trunk flexion ROM  DTRs:    POSTURE: rounded shoulders and forward head  LOWER EXTREMITY ROM:     Active  Right Eval Left Eval  Hip flexion    Hip extension    Hip abduction    Hip adduction    Hip internal rotation    Hip external rotation    Knee flexion    Knee extension    Ankle dorsiflexion 5 5  Ankle plantarflexion     Ankle inversion    Ankle eversion     (Blank rows = not tested)  Lumbar spine ROM: -pain with lumbar flexion, unable to maintain straight legs, pain with end-range  flexion and then grabbing pain in lumbar as he arises   -RLE SLR approx 50-70 degrees no pain in buttocks LLE SLR approx 30-45 degrees with pain in buttocks  LOWER EXTREMITY MMT:    Grossly 5/5 BLE. Able to perform 10+ sit-ups with BLE support Palpation to left pririformis unrevealing, assessment of L-spine in prone unrevealing for gross abnormality  BED MOBILITY:    TRANSFERS: Assistive device utilized: None  Sit to stand: Complete Independence Stand to sit: Complete Independence Chair to chair: Complete Independence Floor:  NT  RAMP:   CURB:  NT  STAIRS: NT  GAIT: Gait pattern:  short pattern, reduced trunk/pelvic rotation and decreased stride length, limited reciprocal arm swing, decreased hip extension Distance walked:  Assistive device utilized: None Level of assistance: Complete Independence Comments:   FUNCTIONAL TESTS:  10 meter walk test: 9 sec; 3.64 ft/sec Mini-BEST : 25/28  M-CTSIB  Condition 1: Firm Surface, EO 30 Sec, Normal Sway  Condition 2: Firm Surface, EC 30 Sec, Mild Sway  Condition 3: Foam Surface, EO 30 Sec, Mild Sway  Condition 4: Foam Surface, EC 30 Sec, Mild and Moderate Sway     PATIENT SURVEYS:    TODAY'S TREATMENT:                                                                                                                              DATE: see HEP    PATIENT EDUCATION: Education details: PD specific intervention rationale, LBP PT recommendation Person educated: Patient Education method: Explanation Education comprehension: verbalized understanding  HOME EXERCISE PROGRAM: Access Code: [email or web address] URL: [email or web address] Date: 10/05/2022 Prepared by: Sherlyn Lees  Exercises - Supine Lower Trunk Rotation  - 1 x daily - 7 x weekly - 3 sets -  10 reps - Hooklying Single Knee to Chest  - 1 x daily - 7 x weekly - 3 sets - 10 reps - Supine Single Knee to Chest  - 1 x daily - 7 x weekly - 3 sets - 10 reps - Supine Double Knee to Chest  - 1 x daily - 7 x weekly - 3 sets - 10 reps  GOALS: Goals reviewed with patient? Yes  SHORT TERM GOALS: Target date: 10/26/2022    Patient will be independent in HEP to improve functional outcomes Baseline: Goal status: GOAL MET for initial HEP 10/27/2022    LONG TERM GOALS: Target date: 11/16/2022    Patient will report low back pain not exceeding 2/10 with forward flexion/exercise routine to improve activity tolerance Baseline: 5/10 LBP Goal status: IN PROGRESS  2.  Demonstrate TUG cognitive <10% difference from TUG to manifest improved coordination and dual-tasking to reduce imbalance Baseline: regular TUG 9 sec, TUG with cognitive dual-task 15 sec Goal status: IN PROGRESS    ASSESSMENT:  CLINICAL IMPRESSION: Pt returns today for first visit after eval on 10/05/22.  Pt arrives to session reporting no back pain today, just overall stiffness, slowed movement patterns.  *  Of note, pt reports some confusion, but reports this is not new.  Vitals assessed today and are WNL.  Focused skilled PT session on optimal body mechanics with sit<>stand, bed mobility, squats; also focused on attention to posture, intensity of movement patterns with gait and sit<>stand transfers.  Pt responds well to cues throughout session, and he will benefit from continued skilled PT towards goals for improved functional mobility.  OBJECTIVE IMPAIRMENTS: Abnormal gait, decreased activity tolerance, decreased balance, decreased cognition, decreased coordination, decreased ROM, impaired flexibility, improper body mechanics, postural dysfunction, and pain.   ACTIVITY LIMITATIONS: carrying, lifting, bending, standing, squatting, and locomotion level  PARTICIPATION LIMITATIONS: meal prep, cleaning, and community  activity  PERSONAL FACTORS: Age, Time since onset of injury/illness/exacerbation, and 1 comorbidity: back pain  are also affecting patient's functional outcome.   REHAB POTENTIAL: Excellent  CLINICAL DECISION MAKING: Evolving/moderate complexity  EVALUATION COMPLEXITY: Moderate  PLAN:  PT FREQUENCY: 1x/week  PT DURATION: 6 weeks  PLANNED INTERVENTIONS: Therapeutic exercises, Therapeutic activity, Neuromuscular re-education, Balance training, Gait training, Patient/Family education, Self Care, Joint mobilization, Joint manipulation, Stair training, Vestibular training, Canalith repositioning, DME instructions, Aquatic Therapy, Dry Needling, Electrical stimulation, Spinal manipulation, Spinal mobilization, Cryotherapy, Moist heat, Traction, and Ionotophoresis '4mg'$ /ml Dexamethasone, manual therapy  PLAN FOR NEXT SESSION: Continue work on reciprocal/amplitude arm swing with various gait activities, motor dual-task (e.g. walk and ball toss, bounce, etc); review optimal body mechanics with bed mobility, squats, sit<>Stand.  Check about more appts/POC.   Mady Haagensen, PT 10/27/22 4:31 PM Phone: [phone] Fax: [phone]  [facility] at [facility] [place], [place], [place] [address] Phone # [phone] Fax # [phone]

## 2022-10-28 ENCOUNTER — Ambulatory Visit: Payer: Medicare HMO

## 2022-11-01 ENCOUNTER — Ambulatory Visit: Payer: Medicare HMO | Admitting: Physical Therapy

## 2022-11-01 ENCOUNTER — Ambulatory Visit: Payer: Medicare HMO

## 2022-11-01 NOTE — Therapy (Incomplete)
OUTPATIENT PHYSICAL THERAPY NEURO TREATMENT NOTE   Patient Name: Jeremy Davidson MRN: [id number] DOB:Nov 15, 1950, 72 y.o., male Today's Date: 10/27/2022   PCP: Lujean Amel, MD REFERRING PROVIDER: Ludwig Clarks, DO  END OF SESSION:  PT End of Session - 10/27/22 1450     Visit Number 2    Number of Visits 6    Date for PT Re-Evaluation 11/23/22    Authorization Type [employer]    Progress Note Due on Visit 10    PT Start Time 1448    PT Stop Time 1530    PT Time Calculation (min) 42 min    Activity Tolerance Patient tolerated treatment well    Behavior During Therapy [facility] for tasks assessed/performed              Past Medical History:  Diagnosis Date   Blood in stool 2010   Colonoscopy benign repeat 2015   Elevated cholesterol    Hypertension    Parkinson's disease 03/17/2017   Past Surgical History:  Procedure Laterality Date   HERNIA REPAIR  06/23/2010   With large ultra pro hrnia system-- Dr Donne Hazel 1971. x2   Patient Active Problem List   Diagnosis Date Noted   Viral URI with cough 19/37/9024   Acute metabolic encephalopathy 09/73/5329   Difficulty walking 05/23/2022   Hyponatremia 05/23/2022   Parkinson's disease 03/17/2017   Essential hypertension 09/09/2015   Hyperlipidemia 09/09/2015    ONSET DATE: 6-7 years for PD and LBP flare-up a couples  REFERRING DIAG: M54.50 (ICD-10-CM) - Acute left-sided low back pain without sciatica G20.A2 (ICD-10-CM) - Parkinson's disease without dyskinesia, with fluctuating manifestations  THERAPY DIAG:  Other abnormalities of gait and mobility  Muscle weakness (generalized)  Unsteadiness on feet  Rationale for Evaluation and Treatment: Rehabilitation  SUBJECTIVE:                                                                                                                                                                                             SUBJECTIVE STATEMENT: ***Feel overall slower,  unsteadiness, tired all the time, some confusion lately.    Back not really bothering me today, but picking up things from the floor is hard on my back. Pt accompanied by: self  PERTINENT HISTORY: PD  PAIN:  Are you having pain? No real pain  PRECAUTIONS: None  WEIGHT BEARING RESTRICTIONS: No  FALLS: Has patient fallen in last 6 months? No  LIVING ENVIRONMENT: Lives with: lives with their family Lives in: [place], town home Stairs: Yes: Internal: flight steps; on right going up and on left going up Has following equipment at  home: None  PLOF: Independent and Independent with community mobility without device  PATIENT GOALS: get rid of back pain, just check it out. Never gone through much PT for PD  OBJECTIVE:    TODAY'S TREATMENT: 11/01/2022 Activity Comments                      TODAY'S TREATMENT: 10/27/2022 Activity Comments  Reviewed SKTC, DKTC, and trunk rotation as HEP given last visit Reviewed demo understanding.  Reminded patient to do these exercises daily.  Bed mobility from mat surface, working on supine>sidelying>sit; performed in reverse for sit>sidelying>supine, x 5 reps Cues for log roll, as pt initially comes up from supine>long sit and c/o back pain.  With log rolling, pt does not have c/o pain.  Sit<>stand 2 x 5 reps, cues for quad/glut activation for full upright stand, 3 sec hold time   Standing wide BOS lumbar stretch ant/post direction, 5 reps Consistent cues for tactile and demo cues  Minisquats to pick up target from floor, 2 x 5 reps Cues for neutral spine, no c/o pain  Gait training with focus on increased stride length-PT counts steps over 50 ft distance, multiple reps-pt takes 28 steps.  With attention to increased stride length, pt takes 19-20 steps Also provided cues for upright posture, relaxed arm swing.  Resisted gait in parallel bars, then in gym area, with blue theraband Cues for posture, increased intensity of movement pattern   Vitals at end of session; pt reported dizziness, resolves in sitting 117/74   Access Code: [email or web address] URL: [email or web address] Date: 10/27/2022 Prepared by: [facility]  Program Notes Walk along your hallway-take LONG STRIDES, Look ahead, and relax your arms.  Do this  5-6 reps.  Exercises - Supine Lower Trunk Rotation  - 1 x daily - 7 x weekly - 3 sets - 10 reps - Hooklying Single Knee to Chest  - 1 x daily - 7 x weekly - 3 sets - 10 reps - Supine Single Knee to Chest  - 1 x daily - 7 x weekly - 3 sets - 10 reps - Supine Double Knee to Chest  - 1 x daily - 7 x weekly - 3 sets - 10 reps  PATIENT EDUCATION: Education details: Intensity of movement patternsfor sit<>stand, squats, gait; optimal body mechanics for squats, gait Person educated: Patient Education method: Consulting civil engineer, Demonstration, Verbal cues, and Handouts Education comprehension: verbalized understanding, returned demonstration, verbal cues required, and needs further education  ------------------------------------------------------------------------------------------------- Objective measures below taken at initial eval:  DIAGNOSTIC FINDINGS:   COGNITION: Overall cognitive status: Impaired  Short-term recall limited SENSATION: WFL  COORDINATION: Slow alternating movements. More deficits with dissociative movements Unable to coordinate double foot hop Heel to shin moderately impaired left > right Finger to nose WNL Finger opposition: unable to perform bilaterally  EDEMA:    MUSCLE TONE: NT  MUSCLE LENGTH: Bilateral hamstring tightness evident by 15-20 degree knee flexion when performing forward trunk flexion ROM  DTRs:    POSTURE: rounded shoulders and forward head  LOWER EXTREMITY ROM:     Active  Right Eval Left Eval  Hip flexion    Hip extension    Hip abduction    Hip adduction    Hip internal rotation    Hip external rotation    Knee  flexion    Knee extension    Ankle dorsiflexion 5 5  Ankle plantarflexion    Ankle inversion  Ankle eversion     (Blank rows = not tested)  Lumbar spine ROM: -pain with lumbar flexion, unable to maintain straight legs, pain with end-range flexion and then grabbing pain in lumbar as he arises   -RLE SLR approx 50-70 degrees no pain in buttocks LLE SLR approx 30-45 degrees with pain in buttocks  LOWER EXTREMITY MMT:    Grossly 5/5 BLE. Able to perform 10+ sit-ups with BLE support Palpation to left pririformis unrevealing, assessment of L-spine in prone unrevealing for gross abnormality  BED MOBILITY:    TRANSFERS: Assistive device utilized: None  Sit to stand: Complete Independence Stand to sit: Complete Independence Chair to chair: Complete Independence Floor:  NT  RAMP:   CURB:  NT  STAIRS: NT  GAIT: Gait pattern:  short pattern, reduced trunk/pelvic rotation and decreased stride length, limited reciprocal arm swing, decreased hip extension Distance walked:  Assistive device utilized: None Level of assistance: Complete Independence Comments:   FUNCTIONAL TESTS:  10 meter walk test: 9 sec; 3.64 ft/sec Mini-BEST : 25/28  M-CTSIB  Condition 1: Firm Surface, EO 30 Sec, Normal Sway  Condition 2: Firm Surface, EC 30 Sec, Mild Sway  Condition 3: Foam Surface, EO 30 Sec, Mild Sway  Condition 4: Foam Surface, EC 30 Sec, Mild and Moderate Sway     PATIENT SURVEYS:    TODAY'S TREATMENT:                                                                                                                              DATE: see HEP    PATIENT EDUCATION: Education details: PD specific intervention rationale, LBP PT recommendation Person educated: Patient Education method: Explanation Education comprehension: verbalized understanding  HOME EXERCISE PROGRAM: Access Code: [email or web address] URL: [email or web address] Date: 10/05/2022 Prepared by: Sherlyn Lees  Exercises - Supine Lower Trunk Rotation  - 1 x daily - 7 x weekly - 3 sets - 10 reps - Hooklying Single Knee to Chest  - 1 x daily - 7 x weekly - 3 sets - 10 reps - Supine Single Knee to Chest  - 1 x daily - 7 x weekly - 3 sets - 10 reps - Supine Double Knee to Chest  - 1 x daily - 7 x weekly - 3 sets - 10 reps  GOALS: Goals reviewed with patient? Yes  SHORT TERM GOALS: Target date: 10/26/2022    Patient will be independent in HEP to improve functional outcomes Baseline: Goal status: GOAL MET for initial HEP 10/27/2022    LONG TERM GOALS: Target date: 11/16/2022    Patient will report low back pain not exceeding 2/10 with forward flexion/exercise routine to improve activity tolerance Baseline: 5/10 LBP Goal status: IN PROGRESS  2.  Demonstrate TUG cognitive <10% difference from TUG to manifest improved coordination and dual-tasking to reduce imbalance Baseline: regular TUG 9 sec, TUG with cognitive dual-task 15 sec Goal status: IN PROGRESS    ASSESSMENT:  CLINICAL IMPRESSION: ***  Pt returns today for first visit after eval on 10/05/22.  Pt arrives to session reporting no back pain today, just overall stiffness, slowed movement patterns.  *Of note, pt reports some confusion, but reports this is not new.  Vitals assessed today and are WNL.  Focused skilled PT session on optimal body mechanics with sit<>stand, bed mobility, squats; also focused on attention to posture, intensity of movement patterns with gait and sit<>stand transfers.  Pt responds well to cues throughout session, and he will benefit from continued skilled PT towards goals for improved functional mobility.  OBJECTIVE IMPAIRMENTS: Abnormal gait, decreased activity tolerance, decreased balance, decreased cognition, decreased coordination, decreased ROM, impaired flexibility, improper body mechanics, postural dysfunction, and pain.   ACTIVITY LIMITATIONS: carrying, lifting, bending, standing, squatting, and  locomotion level  PARTICIPATION LIMITATIONS: meal prep, cleaning, and community activity  PERSONAL FACTORS: Age, Time since onset of injury/illness/exacerbation, and 1 comorbidity: back pain  are also affecting patient's functional outcome.   REHAB POTENTIAL: Excellent  CLINICAL DECISION MAKING: Evolving/moderate complexity  EVALUATION COMPLEXITY: Moderate  PLAN:  PT FREQUENCY: 1x/week  PT DURATION: 6 weeks  PLANNED INTERVENTIONS: Therapeutic exercises, Therapeutic activity, Neuromuscular re-education, Balance training, Gait training, Patient/Family education, Self Care, Joint mobilization, Joint manipulation, Stair training, Vestibular training, Canalith repositioning, DME instructions, Aquatic Therapy, Dry Needling, Electrical stimulation, Spinal manipulation, Spinal mobilization, Cryotherapy, Moist heat, Traction, and Ionotophoresis '4mg'$ /ml Dexamethasone, manual therapy  PLAN FOR NEXT SESSION: ***Continue work on reciprocal/amplitude arm swing with various gait activities, motor dual-task (e.g. walk and ball toss, bounce, etc); review optimal body mechanics with bed mobility, squats, sit<>Stand.  Check about more appts/POC.   Mady Haagensen, PT 10/27/22 4:31 PM Phone: [phone] Fax: [phone]  [facility] at [facility] [place], [place], [place] [address] Phone # [phone] Fax # [phone]

## 2022-11-02 ENCOUNTER — Other Ambulatory Visit (HOSPITAL_COMMUNITY): Payer: Self-pay | Admitting: Family Medicine

## 2022-11-02 DIAGNOSIS — R1319 Other dysphagia: Secondary | ICD-10-CM

## 2022-11-09 ENCOUNTER — Inpatient Hospital Stay (HOSPITAL_COMMUNITY): Admission: RE | Admit: 2022-11-09 | Payer: Medicare HMO | Source: Ambulatory Visit

## 2022-11-09 DIAGNOSIS — M9903 Segmental and somatic dysfunction of lumbar region: Secondary | ICD-10-CM | POA: Diagnosis not present

## 2022-11-09 DIAGNOSIS — M5432 Sciatica, left side: Secondary | ICD-10-CM | POA: Diagnosis not present

## 2022-11-10 ENCOUNTER — Ambulatory Visit (HOSPITAL_COMMUNITY)
Admission: RE | Admit: 2022-11-10 | Discharge: 2022-11-10 | Disposition: A | Discharge status: Home / Self Care | Payer: Medicare HMO | Source: Ambulatory Visit | Attending: Family Medicine | Admitting: Family Medicine

## 2022-11-10 DIAGNOSIS — K224 Dyskinesia of esophagus: Secondary | ICD-10-CM | POA: Diagnosis not present

## 2022-11-10 DIAGNOSIS — R1319 Other dysphagia: Secondary | ICD-10-CM

## 2022-11-10 DIAGNOSIS — R1310 Dysphagia, unspecified: Secondary | ICD-10-CM | POA: Diagnosis not present

## 2022-11-11 ENCOUNTER — Ambulatory Visit: Payer: Medicare HMO

## 2022-11-23 ENCOUNTER — Ambulatory Visit (INDEPENDENT_AMBULATORY_CARE_PROVIDER_SITE_OTHER): Payer: Medicare HMO

## 2022-11-23 ENCOUNTER — Encounter: Payer: Self-pay | Admitting: Orthopaedic Surgery

## 2022-11-23 ENCOUNTER — Ambulatory Visit (INDEPENDENT_AMBULATORY_CARE_PROVIDER_SITE_OTHER): Payer: Medicare HMO | Admitting: Orthopaedic Surgery

## 2022-11-23 VITALS — BP 125/82 | HR 65 | Ht 72.0 in | Wt 175.0 lb

## 2022-11-23 DIAGNOSIS — M545 Low back pain, unspecified: Secondary | ICD-10-CM

## 2022-11-23 DIAGNOSIS — G8929 Other chronic pain: Secondary | ICD-10-CM

## 2022-11-23 NOTE — Progress Notes (Signed)
Office Visit Note   Patient: Jeremy Davidson           Date of Birth: [birth year]           MRN: [id number] Visit Date: 11/23/2022              Requested by: Lujean Amel, MD [place],  [place] [address] PCP: Lujean Amel, MD   Assessment & Plan: Visit Diagnoses:  1. Chronic bilateral low back pain, unspecified whether sciatica present     Plan: He is see if he can isolate if some particular exercises might be aggravating his back symptoms.  Currently does not have enough symptoms to consider diagnostic imaging.  He is already doing some Parkinson's therapy classes including boxing, working out with a Physiological scientist 4 times a week.  If he develops claudication or radicular symptoms he can return.  Follow-Up Instructions: Return if symptoms worsen or fail to improve.   Orders:  Orders Placed This Encounter  Procedures   XR Lumbar Spine 2-3 Views   XR Pelvis 1-2 Views   No orders of the defined types were placed in this encounter.     Procedures: No procedures performed   Clinical Data: No additional findings.   Subjective: Chief Complaint  Patient presents with   Lower Back - Pain    HPI 72 year old male with Parkinson's on medications had low back pain for years.  Works out with a Physiological scientist 4 times a week and wife feels that trainer sometimes doing certain exercises making his symptoms worse.  He has some trouble getting from sitting standing at times states is not really bothering him now.  He has been through chiropractic treatment in the past uses Tylenol occasionally for the pain.  No bowel or bladder symptoms no fever or chills.  Review of Systems all systems noncontributory to HPI.   Objective: Vital Signs: BP 125/82   Pulse 65   Ht 6' (1.829 m)   Wt 175 lb (79.4 kg)   BMI 23.73 kg/m   Physical Exam Constitutional:      Appearance: He is well-developed.  HENT:     Head: Normocephalic and atraumatic.      Right Ear: External ear normal.     Left Ear: External ear normal.  Eyes:     Pupils: Pupils are equal, round, and reactive to light.  Neck:     Thyroid: No thyromegaly.     Trachea: No tracheal deviation.  Cardiovascular:     Rate and Rhythm: Normal rate.  Pulmonary:     Effort: Pulmonary effort is normal.     Breath sounds: No wheezing.  Abdominal:     General: Bowel sounds are normal.     Palpations: Abdomen is soft.  Musculoskeletal:     Cervical back: Neck supple.  Skin:    General: Skin is warm and dry.     Capillary Refill: Capillary refill takes less than 2 seconds.  Neurological:     Mental Status: He is alert and oriented to person, place, and time.  Psychiatric:        Behavior: Behavior normal.        Thought Content: Thought content normal.        Judgment: Judgment normal.     Ortho Exam patient with some Parkinson's movement keeps his hands straight nonmoving with ambulation.  Short stride gait.  Minimal tremor.  Negative logroll hips.  He gets from sitting standing comfortably.  Anterior tib quad gastrocsoleus are intact.  Specialty Comments:  No specialty comments available.  Imaging: XR Pelvis 1-2 Views  Result Date: 11/23/2022 AP pelvis frog-leg hips demonstrates normal hip joints right and left.  Pelvis bone anatomy is intact.  No lytic or sclerotic changes. Impression: Normal pelvis and hip x-rays.  XR Lumbar Spine 2-3 Views  Result Date: 11/23/2022 AP lateral lumbar spine images are obtained and reviewed.  Mild lumbar curvature.  Anterior endplate spurring Q2-2 W9-7 without spondylolisthesis.  Mild facet degenerative changes. Impression: Lumbar spine negative for fracture or listhesis.  No acute or significant chronic changes noted.    PMFS History: Patient Active Problem List   Diagnosis Date Noted   Viral URI with cough 98/92/1194   Acute metabolic encephalopathy 17/40/8144   Difficulty walking 05/23/2022   Hyponatremia 05/23/2022   Parkinson's  disease 03/17/2017   Essential hypertension 09/09/2015   Hyperlipidemia 09/09/2015   Past Medical History:  Diagnosis Date   Blood in stool 2010   Colonoscopy benign repeat 2015   Elevated cholesterol    Hypertension    Parkinson's disease 03/17/2017    No family history on file.  Past Surgical History:  Procedure Laterality Date   HERNIA REPAIR  06/23/2010   With large ultra pro hrnia system-- Dr Donne Hazel 1971. x2   Social History   Occupational History   Not on file  Tobacco Use   Smoking status: Former    Types: Cigarettes    Quit date: 10/17/1998    Years since quitting: 24.1   Smokeless tobacco: Never  Vaping Use   Vaping Use: Never used  Substance and Sexual Activity   Alcohol use: Yes    Comment: 1-3 beer/wine per day   Drug use: No   Sexual activity: Not on file

## 2022-11-27 ENCOUNTER — Other Ambulatory Visit: Payer: Self-pay | Admitting: Neurology

## 2022-11-27 DIAGNOSIS — G20A1 Parkinson's disease without dyskinesia, without mention of fluctuations: Secondary | ICD-10-CM

## 2022-11-27 DIAGNOSIS — F330 Major depressive disorder, recurrent, mild: Secondary | ICD-10-CM

## 2022-11-30 NOTE — Therapy (Unsigned)
OUTPATIENT SPEECH LANGUAGE PATHOLOGY PARKINSON'S TREATMENT   Patient Name: Jeremy Davidson MRN: [id number] DOB:Mar 29, 1951, 72 y.o., male Today's Date: 12/01/2022  PCP: Lujean Amel, MD REFERRING PROVIDER: Alonza Bogus, DO  END OF SESSION:  End of Session - 12/01/22 0937     Visit Number 2    Number of Visits 25    Date for SLP Re-Evaluation 01/03/23    SLP Start Time 0937   arrived late   SLP Stop Time  1015    SLP Time Calculation (min) 38 min    Activity Tolerance Patient tolerated treatment well              Past Medical History:  Diagnosis Date   Blood in stool 2010   Colonoscopy benign repeat 2015   Elevated cholesterol    Hypertension    Parkinson's disease 03/17/2017   Past Surgical History:  Procedure Laterality Date   HERNIA REPAIR  06/23/2010   With large ultra pro hrnia system-- Dr Donne Hazel 1971. x2   Patient Active Problem List   Diagnosis Date Noted   Viral URI with cough 123XX123   Acute metabolic encephalopathy 123XX123   Difficulty walking 05/23/2022   Hyponatremia 05/23/2022   Parkinson's disease 03/17/2017   Essential hypertension 09/09/2015   Hyperlipidemia 09/09/2015    ONSET DATE: script dated 09-21-22  REFERRING DIAG: G20.A2 (ICD-10-CM) - Parkinson's disease without dyskinesia, with fluctuating manifestations R49.8 (ICD-10-CM) - Hypophonia  THERAPY DIAG: Dysarthria and anarthria  Rationale for Evaluation and Treatment: Rehabilitation  SUBJECTIVE:   SUBJECTIVE STATEMENT: "I've had a lot going on so I had to cancel." "People often lean forward to hear me"  Pt accompanied by: self  PERTINENT HISTORY:   72 year old male with a history of Parkinson's disease, hypertension, hyperlipidemia. He presented to ED on 05-22-22 with confusion and difficulty walking and was admitted  for viral URI. At visit with Dr. Carles Collet on 09-21-22 there were c/o "worsening dementia."   PAIN: Are you having pain? No  FALLS: Has patient fallen in last 6  months?  See PT evaluation for details  PATIENT GOALS: Improve conversational volume  OBJECTIVE:   OBJECTIVE VOICE ASSESSMENT: Sustained "ah" loudness average: 63 dB, range 58-67 dB Oral reading (passage) loudness average: 61 dB Oral reading loudness range: 54-65 dB Conversational loudness average: 62 dB (below WNL) Conversational loudness range: 56-66 dB (below WNL) Voice quality: rough and low vocal intensity ; roughness likely due to decr'd breath support Stimulability trials: Given SLP modeling and occasional min cues, loudness average increased to upper 60s - low 70s dB (range of 64 to 74) at paragraph reading level.  Comments: SLP recorded pt's paragraph reading and then paragraph reading after loud /a/ with directions to use as much effort as with loud /a/ and volume increased to upper 60s - low 70s dB. Pt remarked, "That's louder!" When he heard this.  Completed audio recording of patients baseline voice without cueing from SLP: Yes  Pt does report difficulty with swallowing which does warrant further evaluation.Bedside swallow eval to be completed next 1-2 sessions  PATIENT REPORTED OUTCOME MEASURES (PROM): Communication Effectiveness Survey: to be completed in next 1-2 visits  TODAY'S TREATMENT:  DATE:  12/01/22: Pt returned for first ST tx after evaluation in December 2023. Pt reported listeners frequently lean towards him in order to understand him better. Pt initially averaged mid 60s dB in opening conversation (below WNL 70-72 dB). Re-educated patient on Parkinson's impact on speech and swallowing and the recommended techniques to optimize his volume and clarity, including using principles of intent and loud volume. SLP modeled and instructed loud /a/ with pt averaging 91 dB for 5 trials. Generated personally relevant sentences, with intermittent  mod A to achieve low 70s dB. In subsequent conversation, pt averaged 68 dB. Non-verbal cues (cupping ear) was effective to optimize vocal intensity to WNL. Towards end of session, pt presenting with throat clearing and coughing after sips of thin liquids. CSE recommended next session as this was not accomplished today due to time constraints. Recommended pt monitor frequency of throat clearing/coughing and report next session. HEP: loud /a/, reading functional sentences, monitor throat clear/coughing   10/05/22: Discussed results of today's evaluation, taught pt loud /a/ and reviewed handout on how to accomplish loud /a/ at home. SLP explained bedside swallow eval to take place next session and basics of MBS.  PATIENT EDUCATION: Education details: See "today's treatment" Person educated: Patient Education method: Explanation, Demonstration, Verbal cues, and Handouts Education comprehension: verbalized understanding, returned demonstration, verbal cues required, and needs further education  HOME EXERCISE PROGRAM: Loud /a/ x10, reading functional sentences BID  GOALS: Goals reviewed with patient? Yes   SHORT TERM GOALS: Target date: 11/11/22 (new STG date = 12/29/2022 d/t re-scheduling)   Pt will undergo bedside swallow evaluation and subsequent MBS if clinically indicated Baseline: Goal status: ONGOING   2.  Pt will demo loud sustained /a/ or "Hey - ahhhhhh" with upper80s dB x4 sessions  Baseline: 12/01/22 Goal status: ONGOING   3.  Pt will demo low 70s dB average in 18/20 sentence responses in 2 sessions Baseline:  Goal status: ONGOING  4.  Pt will demo low 70s dB average in 3 minutes simple conversation Baseline:  Goal status: ONGOING     LONG TERM GOALS: Target date: 01/03/23    Pt will report higher/better PROM scores than initial session Baseline:  Goal status: ONGOING   2.  Pt will demo WNL volume (low 70s dB average) in 10 minutes simple to mod complex conversation with  modified independence (external cues PRN) x3 sessions Baseline:  Goal status: ONGOING   3.  Pt will complete homework (HEP) for at least 7 consecutive days Baseline:  Goal status: ONGOING  ASSESSMENT:  CLINICAL IMPRESSION: Patient is a 72 y.o. male who was seen today for treatment of dysarthria in light of Parkinson's disease. He also reports occasional swallowing difficulties which will be assessed next 1-2 sessions and MBS suggested PRN. Swallowing goals will be added PRN. Marguerite expresses frustration at an inability to improve his loudness in conversation and thus his overall communication ability with family as reported in "s" statement. Initiated education and instruction of dysarthria strategies and principles to optimize patient speech volume and clarity in order to be better understood.   OBJECTIVE IMPAIRMENTS: Objective impairments include attention, memory, dysarthria, and dysphagia. These impairments are limiting patient from household responsibilities, ADLs/IADLs, effectively communicating at home and in community, and safety when swallowing.Factors affecting potential to achieve goals and functional outcome are ability to learn/carryover information.. Patient will benefit from skilled SLP services to address above impairments and improve overall function.  REHAB POTENTIAL: Good  PLAN:  SLP FREQUENCY: 2x/week  SLP  DURATION: 12 weeks   PLANNED INTERVENTIONS: Aspiration precaution training, Pharyngeal strengthening exercises, Diet toleration management , Environmental controls, Trials of upgraded texture/liquids, Cueing hierachy, Cognitive reorganization, Internal/external aids, Functional tasks, SLP instruction and feedback, Compensatory strategies, and Patient/family education    Marzetta Board, [place] 12/01/2022, 9:50 AM

## 2022-12-01 ENCOUNTER — Ambulatory Visit: Payer: Medicare HMO | Attending: Neurology

## 2022-12-01 DIAGNOSIS — R471 Dysarthria and anarthria: Secondary | ICD-10-CM | POA: Insufficient documentation

## 2022-12-01 NOTE — Patient Instructions (Signed)
PLEASE DO 10 LOUD "[place]" twice each day.  Take a breath, say "AHHHHHHHH" as long as you can (about 10 seconds). When you feel like you need to take another breath, STOP.  Practice reading these sentences in your intentional loud voice.  Butch Penny, when are you going to make that stew again? It was great! Did you put gas in the car after running errands? Have you checked the mail yet? Today's workout with Will was pretty intense. He worked [place] good.  Hopefully the [facility] will have a good team this year.  I don't think I will make it out to [facility] for a game this year, but I still have my fingers crossed.  Candice Camp, are you up for a walk this morning? It's going to be a decent day.  When are Barbette Merino, and Huxlee coming to the house again? I miss them.  Have you heard from [place] about the new house?  It's look like we have plenty signed up for the beach trip in June.

## 2022-12-08 ENCOUNTER — Ambulatory Visit: Payer: Medicare HMO

## 2022-12-08 DIAGNOSIS — R471 Dysarthria and anarthria: Secondary | ICD-10-CM | POA: Diagnosis not present

## 2022-12-08 NOTE — Therapy (Signed)
OUTPATIENT SPEECH LANGUAGE PATHOLOGY PARKINSON'S TREATMENT   Patient Name: Jeremy Davidson MRN: [id number] DOB:March 19, 1951, 72 y.o., male Today's Date: 12/08/2022  PCP: Lujean Amel, MD REFERRING PROVIDER: Alonza Bogus, DO  END OF SESSION:  End of Session - 12/08/22 1450     Visit Number 3    Number of Visits 25    Date for SLP Re-Evaluation 01/03/23    SLP Start Time 1449    SLP Stop Time  1530    SLP Time Calculation (min) 41 min    Activity Tolerance Patient tolerated treatment well              Past Medical History:  Diagnosis Date   Blood in stool 2010   Colonoscopy benign repeat 2015   Elevated cholesterol    Hypertension    Parkinson's disease 03/17/2017   Past Surgical History:  Procedure Laterality Date   HERNIA REPAIR  06/23/2010   With large ultra pro hrnia system-- Dr Donne Hazel 1971. x2   Patient Active Problem List   Diagnosis Date Noted   Viral URI with cough 123XX123   Acute metabolic encephalopathy 123XX123   Difficulty walking 05/23/2022   Hyponatremia 05/23/2022   Parkinson's disease 03/17/2017   Essential hypertension 09/09/2015   Hyperlipidemia 09/09/2015    ONSET DATE: script dated 09-21-22  REFERRING DIAG: G20.A2 (ICD-10-CM) - Parkinson's disease without dyskinesia, with fluctuating manifestations R49.8 (ICD-10-CM) - Hypophonia  THERAPY DIAG: Dysarthria and anarthria  Rationale for Evaluation and Treatment: Rehabilitation  SUBJECTIVE:   SUBJECTIVE STATEMENT: "I'm more conscious about speaking up" Pt accompanied by: self  PERTINENT HISTORY:   72 year old male with a history of Parkinson's disease, hypertension, hyperlipidemia. He presented to ED on 05-22-22 with confusion and difficulty walking and was admitted  for viral URI. At visit with Dr. Carles Collet on 09-21-22 there were c/o "worsening dementia."   PAIN: Are you having pain? No  FALLS: Has patient fallen in last 6 months?  See PT evaluation for details  PATIENT GOALS:  Improve conversational volume  OBJECTIVE:   TODAY'S TREATMENT:                                                                                                                                          12/08/22: Some subjective improvements in speech reported as pt stated "I notice they are leaning in less." Pt noted increased number of throat clears outside of meals, with SLP educating and instructing throat clear alternatives (sip of water and/or hard swallow) to optimize vocal hygiene. SLP also recommended using principle of intent to optimize swallow function by being mindful of rate/bolus size as precaution. Initial conversation averaged upper 60s dB. Loud /a/ averaged low 90s dB for 6-7 seconds. Oral reading at sentence level averaged upper 60s dB, which improved to low 70s with usual cues to intentionally pace and project voice. Passage level reading faded from low 70s db to  mid 60s dB. Intentional breath support at punctuation improved averaged to upper 60s dB. Updated HEP to include paragraph level reading.   12/01/22: Pt returned for first ST tx after evaluation in December 2023. Pt reported listeners frequently lean towards him in order to understand him better. Pt initially averaged mid 60s dB in opening conversation (below WNL 70-72 dB). Re-educated patient on Parkinson's impact on speech and swallowing and the recommended techniques to optimize his volume and clarity, including using principles of intent and loud volume. SLP modeled and instructed loud /a/ with pt averaging 91 dB for 5 trials. Generated personally relevant sentences, with intermittent mod A to achieve low 70s dB. In subsequent conversation, pt averaged 68 dB. Non-verbal cues (cupping ear) was effective to optimize vocal intensity to WNL. Towards end of session, pt presenting with throat clearing and coughing after sips of thin liquids. CSE recommended next session as this was not accomplished today due to time constraints.  Recommended pt monitor frequency of throat clearing/coughing and report next session. HEP: loud /a/, reading functional sentences, monitor throat clear/coughing   10/05/22: Discussed results of today's evaluation, taught pt loud /a/ and reviewed handout on how to accomplish loud /a/ at home. SLP explained bedside swallow eval to take place next session and basics of MBS.  PATIENT EDUCATION: Education details: See "today's treatment" Person educated: Patient Education method: Explanation, Demonstration, Verbal cues, and Handouts Education comprehension: verbalized understanding, returned demonstration, verbal cues required, and needs further education  HOME EXERCISE PROGRAM: Loud /a/ x10, reading functional sentences BID  GOALS: Goals reviewed with patient? Yes   SHORT TERM GOALS: Target date: 11/11/22 (new STG date = 12/29/2022 d/t re-scheduling)   Pt will undergo bedside swallow evaluation and subsequent MBS if clinically indicated Baseline: Goal status: ONGOING   2.  Pt will demo loud sustained /a/ or "Hey - ahhhhhh" with upper80s dB x4 sessions  Baseline: 12/01/22, 12/08/22 Goal status: ONGOING   3.  Pt will demo low 70s dB average in 18/20 sentence responses in 2 sessions Baseline:  Goal status: ONGOING  4.  Pt will demo low 70s dB average in 3 minutes simple conversation Baseline:  Goal status: ONGOING     LONG TERM GOALS: Target date: 01/03/23    Pt will report higher/better PROM scores than initial session Baseline:  Goal status: ONGOING   2.  Pt will demo WNL volume (low 70s dB average) in 10 minutes simple to mod complex conversation with modified independence (external cues PRN) x3 sessions Baseline:  Goal status: ONGOING   3.  Pt will complete homework (HEP) for at least 7 consecutive days Baseline:  Goal status: ONGOING  ASSESSMENT:  CLINICAL IMPRESSION: Patient is a 72 y.o. male who was seen today for treatment of dysarthria in light of Parkinson's disease.  Ongoing education and instruction of dysarthria strategies and principles provided to optimize patient speech volume and clarity in order to be better understood. With cues, pt able to achieve upper 60s dB average in short structured conversations. Pt would continue to benefit from skilled ST intervention to optimize communication effectiveness and safety during swallowing.   OBJECTIVE IMPAIRMENTS: Objective impairments include attention, memory, dysarthria, and dysphagia. These impairments are limiting patient from household responsibilities, ADLs/IADLs, effectively communicating at home and in community, and safety when swallowing.Factors affecting potential to achieve goals and functional outcome are ability to learn/carryover information.. Patient will benefit from skilled SLP services to address above impairments and improve overall function.  REHAB POTENTIAL: Good  PLAN:  SLP FREQUENCY: 2x/week  SLP DURATION: 12 weeks   PLANNED INTERVENTIONS: Aspiration precaution training, Pharyngeal strengthening exercises, Diet toleration management , Environmental controls, Trials of upgraded texture/liquids, Cueing hierachy, Cognitive reorganization, Internal/external aids, Functional tasks, SLP instruction and feedback, Compensatory strategies, and Patient/family education    Marzetta Board, [place] 12/08/2022, 2:51 PM

## 2022-12-15 ENCOUNTER — Ambulatory Visit: Payer: Medicare HMO

## 2022-12-15 NOTE — Therapy (Signed)
[facility] [address] [place], [place], [address] Phone: [phone]   Fax:  [phone]  Patient Details  Name: HASAAN TREVOR MRN: [id number] Date of Birth: [birth year] Referring Provider:  Andria Frames., MD  Encounter Date: 12/15/2022   Speech Therapy (ST) - no show Pt no-showed to his scheduled ST appointment today.  One additional no-show will result in patient having to make appointments "as-you-go", as stated in attendance policy provided to patient on his evaluation date. He will be notified of this at his next therapy appointment on 12/22/22.   [facility], [place] 12/15/2022, 5:26 PM  [facility] [address] [place], [place], [address] Phone: [phone]   Fax:  [phone]

## 2022-12-22 ENCOUNTER — Ambulatory Visit: Payer: Medicare HMO

## 2022-12-30 ENCOUNTER — Ambulatory Visit: Payer: Medicare HMO | Attending: Neurology

## 2022-12-30 DIAGNOSIS — R471 Dysarthria and anarthria: Secondary | ICD-10-CM

## 2022-12-30 NOTE — Therapy (Signed)
OUTPATIENT SPEECH LANGUAGE PATHOLOGY PARKINSON'S TREATMENT/Discharge summary   Patient Name: Jeremy Davidson MRN: [id number] DOB:Apr 08, 1951, 72 y.o., male Today's Date: 12/30/2022  PCP: Lujean Amel, MD REFERRING PROVIDER: Alonza Bogus, DO  END OF SESSION:  End of Session - 12/30/22 1247     Visit Number 4    Number of Visits 25    Date for SLP Re-Evaluation 01/03/23    SLP Start Time 1020    SLP Stop Time  1049    SLP Time Calculation (min) 29 min    Activity Tolerance Patient tolerated treatment well               Past Medical History:  Diagnosis Date   Blood in stool 2010   Colonoscopy benign repeat 2015   Elevated cholesterol    Hypertension    Parkinson's disease 03/17/2017   Past Surgical History:  Procedure Laterality Date   HERNIA REPAIR  06/23/2010   With large ultra pro hrnia system-- Dr Donne Hazel 1971. x2   Patient Active Problem List   Diagnosis Date Noted   Viral URI with cough 123XX123   Acute metabolic encephalopathy 123XX123   Difficulty walking 05/23/2022   Hyponatremia 05/23/2022   Parkinson's disease 03/17/2017   Essential hypertension 09/09/2015   Hyperlipidemia 09/09/2015   SPEECH THERAPY DISCHARGE SUMMARY  Visits from Start of Care: 4 (in 3 months since eval)  Current functional level related to goals / functional outcomes: Minimal progress towards goals due to inconsistent attendance.   Remaining deficits: Deficits remain.   Education / Equipment: See "today's treatment" section below   Patient agrees to discharge. Patient goals were not met. Patient is being discharged due to the patient's request..   ONSET DATE: script dated 09-21-22  REFERRING DIAG: G20.A2 (ICD-10-CM) - Parkinson's disease without dyskinesia, with fluctuating manifestations R49.8 (ICD-10-CM) - Hypophonia  THERAPY DIAG: Dysarthria and anarthria  Rationale for Evaluation and Treatment: Rehabilitation  SUBJECTIVE:   SUBJECTIVE STATEMENT: "I'm  finding that I'm just more aware and less people are (leaning head forward)."  "I just have a lot going on right now. Let me get my feet on the ground. What do I need to come back?" (Pt, re: SLP inquiring about this is pt's last scheduled visit after 2 completed visits, 3 no-shows, and 5 cancels in 3 months since eval in December)  Pt accompanied by: self  PERTINENT HISTORY:   72 year old male with a history of Parkinson's disease, hypertension, hyperlipidemia. He presented to ED on 05-22-22 with confusion and difficulty walking and was admitted  for viral URI. At visit with Dr. Carles Collet on 09-21-22 there were c/o "worsening dementia."   PAIN: Are you having pain? No  FALLS: Has patient fallen in last 6 months?  See PT evaluation for details  PATIENT GOALS: Improve conversational volume  OBJECTIVE:   TODAY'S TREATMENT:  12/30/22: Pt informed SLP that he continues to see improvements in loudness because he is "more aware" of the need. SLP affirmed pt's attempt and stressed that awareness of softer speech was essential in making a change to habitual WNL loudness. that Pt explained he needs his schedule to clear a bit, in order to put forth the consistency in attendance for ST. SLP encouraged him to look for at least 6 consecutive weeks in his schedule in order to return to ST to foster progress.  Loud /a/ req'd cues from SLP for /a/ instead of "a" (as in cat) after pt's first production; rare min A for this after rep 3/5. SLP also cued pt for abdominal strength rather than laryngeal push. SLP had pt read 8-9 word sentences and average loudness was mid-upper 60s dB; improved to upper 60s-low 70s with occasional min A for increasing intent and loudness. 10+ word sentences read with difficulty with accuracy. SLP told pt to read sentences to himself first and then read out loud  for loudness practice. With this, pt average loudness was mid 60s dB, improved to mid-upper 60s with occasional mod cues for loudness. In conversation of 2 minutes pt demonstrated average loudness of mid 60s dB, which improved to upper 60s dB with usual mod A to incr volume. Given this, SLP questions whether pt has made a significant change in loudness outside of ST as he indicated to SLP at beginning of session.  12/08/22: Some subjective improvements in speech reported as pt stated "I notice they are leaning in less." Pt noted increased number of throat clears outside of meals, with SLP educating and instructing throat clear alternatives (sip of water and/or hard swallow) to optimize vocal hygiene. SLP also recommended using principle of intent to optimize swallow function by being mindful of rate/bolus size as precaution. Initial conversation averaged upper 60s dB. Loud /a/ averaged low 90s dB for 6-7 seconds. Oral reading at sentence level averaged upper 60s dB, which improved to low 70s with usual cues to intentionally pace and project voice. Passage level reading faded from low 70s db to mid 60s dB. Intentional breath support at punctuation improved averaged to upper 60s dB. Updated HEP to include paragraph level reading.   12/01/22: Pt returned for first ST tx after evaluation in December 2023. Pt reported listeners frequently lean towards him in order to understand him better. Pt initially averaged mid 60s dB in opening conversation (below WNL 70-72 dB). Re-educated patient on Parkinson's impact on speech and swallowing and the recommended techniques to optimize his volume and clarity, including using principles of intent and loud volume. SLP modeled and instructed loud /a/ with pt averaging 91 dB for 5 trials. Generated personally relevant sentences, with intermittent mod A to achieve low 70s dB. In subsequent conversation, pt averaged 68 dB. Non-verbal cues (cupping ear) was effective to optimize vocal  intensity to WNL. Towards end of session, pt presenting with throat clearing and coughing after sips of thin liquids. CSE recommended next session as this was not accomplished today due to time constraints. Recommended pt monitor frequency of throat clearing/coughing and report next session. HEP: loud /a/, reading functional sentences, monitor throat clear/coughing   10/05/22: Discussed results of today's evaluation, taught pt loud /a/ and reviewed handout on how to accomplish loud /a/ at home. SLP explained bedside swallow eval to take place next session and basics of MBS.  PATIENT EDUCATION: Education details: See "today's treatment" Person educated: Patient Education method: Explanation, Demonstration, Verbal cues, and  Handouts Education comprehension: verbalized understanding, returned demonstration, verbal cues required, and needs further education  HOME EXERCISE PROGRAM: Loud /a/ x10, reading functional sentences BID  GOALS: Goals reviewed with patient? Yes   SHORT TERM GOALS: Target date: 11/11/22 (new STG date = 12/29/2022 d/t re-scheduling)   Pt will undergo bedside swallow evaluation and subsequent MBS if clinically indicated Baseline: Goal status: Not met   2.  Pt will demo loud sustained /a/ or "Hey - ahhhhhh" with upper80s dB x4 sessions  Baseline: 12/01/22, 12/08/22 Goal status: Partially met   3.  Pt will demo low 70s dB average in 18/20 sentence responses in 2 sessions Baseline:  Goal status: Not met  4.  Pt will demo low 70s dB average in 3 minutes simple conversation Baseline:  Goal status: Not met     LONG TERM GOALS: Target date: 01/03/23    Pt will report higher/better PROM scores than initial session Baseline:  Goal status: Not met   2.  Pt will demo WNL volume (low 70s dB average) in 10 minutes simple to mod complex conversation with modified independence (external cues PRN) x3 sessions Baseline:  Goal status: Not met   3.  Pt will complete homework  (HEP) for at least 7 consecutive days Baseline:  Goal status: Not met  ASSESSMENT:  CLINICAL IMPRESSION: Patient is a 72 y.o. male who was seen today for treatment of dysarthria in light of Parkinson's disease. Ongoing education and instruction of dysarthria strategies and principles provided to optimize patient speech volume and clarity in order to be better understood. .    OBJECTIVE IMPAIRMENTS: Objective impairments include attention, memory, dysarthria, and dysphagia. These impairments are limiting patient from household responsibilities, ADLs/IADLs, effectively communicating at home and in community, and safety when swallowing.Factors affecting potential to achieve goals and functional outcome are ability to learn/carryover information.. Patient will benefit from skilled SLP services to address above impairments and improve overall function.  REHAB POTENTIAL: Good  PLAN: Discharge today  PLANNED INTERVENTIONS: Aspiration precaution training, Pharyngeal strengthening exercises, Diet toleration management , Environmental controls, Trials of upgraded texture/liquids, Cueing hierachy, Cognitive reorganization, Internal/external aids, Functional tasks, SLP instruction and feedback, Compensatory strategies, and Patient/family education    [facility], [place] 12/30/2022, 12:48 PM

## 2023-02-03 DIAGNOSIS — R69 Illness, unspecified: Secondary | ICD-10-CM | POA: Diagnosis not present

## 2023-02-22 ENCOUNTER — Other Ambulatory Visit: Payer: Self-pay | Admitting: Neurology

## 2023-02-22 DIAGNOSIS — G20A1 Parkinson's disease without dyskinesia, without mention of fluctuations: Secondary | ICD-10-CM

## 2023-02-22 DIAGNOSIS — F330 Major depressive disorder, recurrent, mild: Secondary | ICD-10-CM

## 2023-03-06 NOTE — Progress Notes (Unsigned)
Assessment/Plan:   1.  Parkinsons Disease  -increase carbidopa/levodopa 25/100, 2 tablet at 6 AM/2 at 10am/2 at 2pm/1 at 6 PM.   -continue carbidopa/levodopa 50/200 at bed for first AM on  -referral for ST  2.  Insomnia  -no napping after 2 pm   3.  Constipation  -discussed nature and pathophysiology and association with PD  -discussed importance of hydration.  Pt is to increase water intake  -pt is given a copy of the rancho recipe  -recommended daily colace  -recommended miralax prn   4.  Memory change  -Doing mental and physical exercises  -refer for neurocog testing  5.  Depression  -continue lexapro 10 mg daily  6.  Fatigue  -may be related to #5 but discussed that fatigue is the #1 treatment resistant complaint of Parkinson's patients.  7.  LBP  -resolved now  8.  Low BP  -get BP and cuff and check it at home  -increase hydration  -salt liberalization  Subjective:   Jeremy Davidson was seen today in follow up for Parkinsons disease.  My previous records were reviewed prior to todays visit as well as outside records available to me.  Wife supplements history.  We slightly increased his levodopa last visit.  He tolerated that well, without side effects.  Last visit, he was having a lot of back pain and referred him to physical therapy.  It looks like he only went twice and never went back.  We also referred him for speech therapy and he went 3 times and did not want to go back.  He states he wasn't sure it helped but wife does think it does and she would like another referral.  No lightheadedness/near syncope.  As far as he knows, BP is running good at home but he's not checking it.     Current prescribed movement disorder medications: Carbidopa/levodopa 25/100, 2 tablets at 6 AM, 1 tablet at 10 AM/2 PM/6 PM (slight increase) Carbidopa/levodopa 50/200 CR at bedtime  Lexapro, 10 mg daily    PREVIOUS MEDICATIONS: Sinemet and Mirapex (weaned off because of  delusions)  ALLERGIES:  No Known Allergies  CURRENT MEDICATIONS:  Outpatient Encounter Medications as of 03/08/2023  Medication Sig   Ascorbic Acid (VITAMIN C) 1000 MG tablet Take 1,000 mg by mouth daily.   atorvastatin (LIPITOR) 40 MG tablet Take 40 mg by mouth at bedtime.   carbidopa-levodopa (SINEMET CR) 50-200 MG tablet TAKE 1 TABLET BY MOUTH EVERYDAY AT BEDTIME   carbidopa-levodopa (SINEMET IR) 25-100 MG tablet 2 at 6am/1 at 10am/2pm/6pm   docusate sodium (STOOL SOFTENER) 100 MG capsule Take 100 mg by mouth 2 (two) times daily.   escitalopram (LEXAPRO) 10 MG tablet TAKE 1 TABLET BY MOUTH EVERY DAY   Multiple Vitamin (MULTIVITAMIN) tablet Take 1 tablet by mouth daily. Men's health formula multi-mineral 50+   phenol (CHLORASEPTIC) 1.4 % LIQD Use as directed 1 spray in the mouth or throat as needed for throat irritation / pain.   tamsulosin (FLOMAX) 0.4 MG CAPS capsule Take 0.4 mg by mouth daily.   No facility-administered encounter medications on file as of 03/08/2023.    Objective:   PHYSICAL EXAMINATION:    VITALS:   Vitals:   03/08/23 0831  BP: (!) 90/50  Pulse: 69  SpO2: 96%  Weight: 178 lb 8 oz (81 kg)  Height: 6' (1.829 m)    GEN:  The patient appears stated age and is in NAD. HEENT:  Normocephalic, atraumatic.  The mucous membranes are moist. The superficial temporal arteries are without ropiness or tenderness. CV:  RRR Lungs:  CTAB Neck/HEME:  There are no carotid bruits bilaterally.  Neurological examination:  Orientation: The patient is alert and oriented x3. Cranial nerves: There is good facial symmetry with facial hypomimia. The speech is fluent and clear. Soft palate rises symmetrically and there is no tongue deviation. Hearing is intact to conversational tone. Sensation: Sensation is intact to light touch throughout Motor: Strength is at least antigravity x4.  Movement examination: Tone: There is mod increased tone in the LUE and mild increased in the  RUE Abnormal movements: LUE rest tremor, mild to mod Coordination:  There is mild decremation with RAM's, with any form of RAMS, including alternating supination and pronation of the forearm, hand opening and closing, finger taps, heel taps and toe taps., L>R (same as previous) Gait and Station: The patient ambulates well in our hallway and is steady but doesn't have a lot of armswing b/l  Total time spent on today's visit was 31 minutes, including both face-to-face time and nonface-to-face time.  Time included that spent on review of records (prior notes available to me/labs/imaging if pertinent), discussing treatment and goals, answering patient's questions and coordinating care.  Cc:  Darrow Bussing, MD

## 2023-03-08 ENCOUNTER — Encounter: Payer: Self-pay | Admitting: Neurology

## 2023-03-08 ENCOUNTER — Ambulatory Visit: Payer: Medicare HMO | Admitting: Neurology

## 2023-03-08 VITALS — BP 90/50 | HR 69 | Ht 72.0 in | Wt 178.5 lb

## 2023-03-08 DIAGNOSIS — G20A1 Parkinson's disease without dyskinesia, without mention of fluctuations: Secondary | ICD-10-CM

## 2023-03-08 DIAGNOSIS — R498 Other voice and resonance disorders: Secondary | ICD-10-CM

## 2023-03-08 DIAGNOSIS — G20A2 Parkinson's disease without dyskinesia, with fluctuations: Secondary | ICD-10-CM | POA: Diagnosis not present

## 2023-03-08 MED ORDER — CARBIDOPA-LEVODOPA 25-100 MG PO TABS: ORAL_TABLET | ORAL | 2 refills | Status: DC

## 2023-03-08 NOTE — Patient Instructions (Addendum)
increase carbidopa/levodopa 25/100, 2 tablet at 6 AM/2 at 10am/2 at 2pm/1 at 6 PM.  2.  carbidopa/levodopa 50/200 at bed for first AM on  Increase water intake!    Take your blood pressure in various positions and throughout various times of day.

## 2023-03-10 ENCOUNTER — Ambulatory Visit: Payer: Medicare HMO

## 2023-03-15 ENCOUNTER — Ambulatory Visit: Payer: Medicare HMO

## 2023-03-15 DIAGNOSIS — R471 Dysarthria and anarthria: Secondary | ICD-10-CM | POA: Insufficient documentation

## 2023-03-15 NOTE — Therapy (Signed)
[facility] [address] [place], [place], [address] Phone: [phone]   Fax:  [phone]  Patient Details  Name: Jeremy Davidson MRN: [id number] Date of Birth: April 19, 1951 Referring Provider:  Vladimir Faster, DO  Encounter Date: 03/15/2023  ST - ARRIVE/CANCEL Given pt's challenges with attendance in previous ST course, SLP stressed the need for 6-8 consecutive weeks to complete ST. Moataz told SLP he is on vacation for 8 days in June, and so decided (and SLP agreed) to reschedule ST eval (ESN) for after that vacation in June.    [facility], CCC-SLP 03/15/2023, 10:36 AM  [place] [facility] [address] [place], [place], [address] Phone: [phone]   Fax:  [phone]

## 2023-03-23 ENCOUNTER — Ambulatory Visit: Payer: Medicare HMO | Admitting: Neurology

## 2023-04-07 NOTE — Therapy (Deleted)
OUTPATIENT SPEECH LANGUAGE PATHOLOGY PARKINSON'S EVALUATION   Patient Name: Jeremy Davidson MRN: [id number] DOB:September 17, 1951, 72 y.o., male Today's Date: 04/07/2023  PCP: Darrow Bussing, MD REFERRING PROVIDER: Vladimir Faster., DO  END OF SESSION:   Past Medical History:  Diagnosis Date   Blood in stool 2010   Colonoscopy benign repeat 2015   Elevated cholesterol    Hypertension    Parkinson's disease 03/17/2017   Past Surgical History:  Procedure Laterality Date   HERNIA REPAIR  06/23/2010   With large ultra pro hrnia system-- Dr Dwain Sarna 1971. x2   Patient Active Problem List   Diagnosis Date Noted   Viral URI with cough 05/23/2022   Acute metabolic encephalopathy 05/23/2022   Difficulty walking 05/23/2022   Hyponatremia 05/23/2022   Parkinson's disease 03/17/2017   Essential hypertension 09/09/2015   Hyperlipidemia 09/09/2015    ONSET DATE: referral 03/08/2023  REFERRING DIAG: R49.8 - Hypophonia  THERAPY DIAG:  No diagnosis found.  Rationale for Evaluation and Treatment: Rehabilitation  SUBJECTIVE:   SUBJECTIVE STATEMENT: *** Pt accompanied by: {accompnied:27141}  PERTINENT HISTORY: 72 year old male with a history of Parkinson's disease, hypertension, hyperlipidemia.  Followed by Dr. Arbutus Leas. Prior short course of ST 09/2022 to 12/08/2022  PAIN:  Are you having pain? {OPRCPAIN:27236}  FALLS: Has patient fallen in last 6 months?  {BMWUXLKG:40102}  LIVING ENVIRONMENT: Lives with: lives with their spouse Lives in: House/apartment  PLOF:  Level of assistance: Independent with ADLs Employment: Retired  PATIENT GOALS: ***  OBJECTIVE:   COGNITION: Overall cognitive status: {cognition:24006} Areas of impairment: {cognitive impairment:24009} Comments: ***  MOTOR SPEECH: assessed across variety of speech tasks: reading, word repetition, generative discourse sample Overall motor speech: {slpimpaired:27210} Level of impairment: {SLP level of  impairment:25441} Rate of Speech: {slprateofspeech:28668} Dysfluencies: {SLP dysfluncies:28669} Phonation: {SLP phonation:25439} Oral reading loudness average: *** dB Conversational loudness average: *** dB Voice Quality: {VQL:27192} Respiration: {respbreathing:27195} Word and Phrasal Stress: {SLP stress:28670} Resonance: {SLP resonance:25440} Articulation: {SLParticulation:27218} Diadochokinetic Rate (DDK): {SLP VOZ:36644} Intelligibility: {SLP Intelligible:25442} Motor planning: {slpmotorspeecherrors:27220} Interfering components: {SLP Interfering components (MS):25444} Effective technique: {SLP effective technique (MS):25445}   ORAL MOTOR EXAMINATION: Overall status: {OMESLP2:27645} Comments: ***   OBJECTIVE VOICE ASSESSMENT: Sustained "ah" maximum phonation time: *** seconds Sustained "ah" loudness average: *** dB Oral reading (passage) loudness average: *** dB Oral reading loudness range: *** dB Conversational loudness average: *** dB Conversational loudness range: *** dB Voice quality: {VQL:27192} Stimulability trials: Given SLP modeling and {frequency:26928} {level:26929} cues, loudness average increased to ***dB (range of *** to ***) at (loud "ah", word, sentence, paragraph, conversation) level.  Comments: ***  Completed audio recording of patients baseline voice without cueing from SLP: {yes/no:20286}  Pt {does does not:27788} report difficulty with swallowing which {does does not:27788} warrant further evaluation.  PATIENT REPORTED OUTCOME MEASURES (PROM): {SLPPROM:27095}  TODAY'S TREATMENT:  DATE: ***  PATIENT EDUCATION: Education details: *** Person educated: {Person educated:25204} Education method: {Education Method:25205} Education comprehension: {Education Comprehension:25206}  HOME EXERCISE  PROGRAM: ***   GOALS: Goals reviewed with patient? {yes/no:20286}  SHORT TERM GOALS: Target date: ***  *** Baseline: Goal status: {GOALSTATUS:25110}  2.  *** Baseline:  Goal status: {GOALSTATUS:25110}  3.  *** Baseline:  Goal status: {GOALSTATUS:25110}  4.  *** Baseline:  Goal status: {GOALSTATUS:25110}  5.  *** Baseline:  Goal status: {GOALSTATUS:25110}  6.  *** Baseline:  Goal status: {GOALSTATUS:25110}  LONG TERM GOALS: Target date: ***  *** Baseline:  Goal status: {GOALSTATUS:25110}  2.  *** Baseline:  Goal status: {GOALSTATUS:25110}  3.  *** Baseline:  Goal status: {GOALSTATUS:25110}  4.  *** Baseline:  Goal status: {GOALSTATUS:25110}  5.  *** Baseline:  Goal status: {GOALSTATUS:25110}  6.  *** Baseline:  Goal status: {GOALSTATUS:25110}  ASSESSMENT:  CLINICAL IMPRESSION: Patient is a *** y.o. *** who was seen today for {SLP eval types:29206} s/p ***. Evaluation reveals {MILD/MOD/MARKED/SEVERE:28894} {SLPOBJIMP:27107}. Pt's {ST domains:29166} is c/b ***. Pt reports ***. Pt would benefit from skilled ST to address aforementioned deficits to {CI outcomes:29167}.    OBJECTIVE IMPAIRMENTS: Objective impairments include {SLPOBJIMP:27107}. These impairments are limiting patient from {SLPLIMIT:27108}.Factors affecting potential to achieve goals and functional outcome are {SLP factors:25450}.. Patient will benefit from skilled SLP services to address above impairments and improve overall function.  REHAB POTENTIAL: {rehabpotential:25112}  PLAN:  SLP FREQUENCY: {rehab frequency:25116}  SLP DURATION: {rehab duration:25117}  PLANNED INTERVENTIONS: {SLP treatment/interventions:25449}    Maia Breslow, CCC-SLP 04/07/2023, 8:27 AM

## 2023-04-10 ENCOUNTER — Ambulatory Visit: Payer: Medicare HMO | Attending: Neurology | Admitting: Speech Pathology

## 2023-04-26 DIAGNOSIS — H2513 Age-related nuclear cataract, bilateral: Secondary | ICD-10-CM | POA: Diagnosis not present

## 2023-04-26 DIAGNOSIS — H349 Unspecified retinal vascular occlusion: Secondary | ICD-10-CM | POA: Diagnosis not present

## 2023-04-26 DIAGNOSIS — H5203 Hypermetropia, bilateral: Secondary | ICD-10-CM | POA: Diagnosis not present

## 2023-04-27 ENCOUNTER — Other Ambulatory Visit (HOSPITAL_COMMUNITY): Payer: Self-pay | Admitting: Family Medicine

## 2023-04-27 DIAGNOSIS — I708 Atherosclerosis of other arteries: Secondary | ICD-10-CM

## 2023-04-27 DIAGNOSIS — H3509 Other intraretinal microvascular abnormalities: Secondary | ICD-10-CM

## 2023-05-03 ENCOUNTER — Ambulatory Visit (HOSPITAL_COMMUNITY)
Admission: RE | Admit: 2023-05-03 | Discharge: 2023-05-03 | Disposition: A | Discharge status: Home / Self Care | Payer: Medicare HMO | Source: Ambulatory Visit | Attending: Family Medicine | Admitting: Family Medicine

## 2023-05-03 DIAGNOSIS — I708 Atherosclerosis of other arteries: Secondary | ICD-10-CM | POA: Insufficient documentation

## 2023-05-03 DIAGNOSIS — H3509 Other intraretinal microvascular abnormalities: Secondary | ICD-10-CM | POA: Insufficient documentation

## 2023-05-17 ENCOUNTER — Other Ambulatory Visit: Payer: Self-pay | Admitting: Neurology

## 2023-05-17 DIAGNOSIS — G20A1 Parkinson's disease without dyskinesia, without mention of fluctuations: Secondary | ICD-10-CM

## 2023-05-19 ENCOUNTER — Other Ambulatory Visit: Payer: Self-pay | Admitting: Neurology

## 2023-05-19 DIAGNOSIS — G20A1 Parkinson's disease without dyskinesia, without mention of fluctuations: Secondary | ICD-10-CM

## 2023-05-19 DIAGNOSIS — F330 Major depressive disorder, recurrent, mild: Secondary | ICD-10-CM

## 2023-05-24 DIAGNOSIS — R5383 Other fatigue: Secondary | ICD-10-CM | POA: Diagnosis not present

## 2023-05-24 DIAGNOSIS — I700 Atherosclerosis of aorta: Secondary | ICD-10-CM | POA: Diagnosis not present

## 2023-05-24 DIAGNOSIS — Z9189 Other specified personal risk factors, not elsewhere classified: Secondary | ICD-10-CM | POA: Diagnosis not present

## 2023-05-24 DIAGNOSIS — K224 Dyskinesia of esophagus: Secondary | ICD-10-CM | POA: Diagnosis not present

## 2023-05-24 DIAGNOSIS — G20B2 Parkinson's disease with dyskinesia, with fluctuations: Secondary | ICD-10-CM | POA: Diagnosis not present

## 2023-05-25 DIAGNOSIS — H25013 Cortical age-related cataract, bilateral: Secondary | ICD-10-CM | POA: Diagnosis not present

## 2023-05-25 DIAGNOSIS — H2513 Age-related nuclear cataract, bilateral: Secondary | ICD-10-CM | POA: Diagnosis not present

## 2023-05-25 DIAGNOSIS — H349 Unspecified retinal vascular occlusion: Secondary | ICD-10-CM | POA: Diagnosis not present

## 2023-05-26 ENCOUNTER — Other Ambulatory Visit: Payer: Self-pay | Admitting: Neurology

## 2023-05-26 DIAGNOSIS — G20A1 Parkinson's disease without dyskinesia, without mention of fluctuations: Secondary | ICD-10-CM

## 2023-06-07 ENCOUNTER — Ambulatory Visit: Payer: Medicare HMO | Attending: Family Medicine

## 2023-06-07 DIAGNOSIS — R471 Dysarthria and anarthria: Secondary | ICD-10-CM | POA: Diagnosis not present

## 2023-06-07 DIAGNOSIS — R41841 Cognitive communication deficit: Secondary | ICD-10-CM | POA: Diagnosis not present

## 2023-06-07 DIAGNOSIS — R1310 Dysphagia, unspecified: Secondary | ICD-10-CM | POA: Diagnosis not present

## 2023-06-07 NOTE — Therapy (Signed)
OUTPATIENT SPEECH LANGUAGE PATHOLOGY PARKINSON'S EVALUATION   Patient Name: Jeremy Davidson MRN: [id number] DOB:10/09/1951, 72 y.o., male Today's Date: 06/07/2023  PCP: Darrow Bussing, MD REFERRING PROVIDER: Darrow Bussing, MD   END OF SESSION:  End of Session - 06/07/23 1402     Visit Number 1    Number of Visits 17    Date for SLP Re-Evaluation 08/12/23    SLP Start Time 1234    SLP Stop Time  1315    SLP Time Calculation (min) 41 min    Activity Tolerance Patient tolerated treatment well             Past Medical History:  Diagnosis Date   Blood in stool 2010   Colonoscopy benign repeat 2015   Elevated cholesterol    Hypertension    Parkinson's disease 03/17/2017   Past Surgical History:  Procedure Laterality Date   HERNIA REPAIR  06/23/2010   With large ultra pro hrnia system-- Dr Dwain Sarna 1971. x2   Patient Active Problem List   Diagnosis Date Noted   Viral URI with cough 05/23/2022   Acute metabolic encephalopathy 05/23/2022   Difficulty walking 05/23/2022   Hyponatremia 05/23/2022   Parkinson's disease 03/17/2017   Essential hypertension 09/09/2015   Hyperlipidemia 09/09/2015    ONSET DATE: Approx 8 years ago, script dated 05-24-23  REFERRING DIAG: PD with dyskinesia  THERAPY DIAG:  Dysarthria and anarthria  Dysphagia, unspecified type  Cognitive communication deficit  Rationale for Evaluation and Treatment: Rehabilitation  SUBJECTIVE:   SUBJECTIVE STATEMENT: "It's (pt's loudness) maybe a little bit better than it was when I saw you before (in May 2024) but when we are with people sometimes after I say something I get everybody at once (leaning in) and I know I'm not loud enough." Pt accompanied by: self  PERTINENT HISTORY: From Dr. Don Perking note 03/08/23: Jeremy Davidson was seen today in follow up for Parkinsons disease.  My previous records were reviewed prior to todays visit as well as outside records available to me.  Wife supplements  history.  We slightly increased his levodopa last visit.  He tolerated that well, without side effects.  Last visit, he was having a lot of back pain and referred him to physical therapy.  It looks like he only went twice and never went back.  We also referred him for speech therapy and he went 3 times and did not want to go back.  He states he wasn't sure it helped but wife does think it does and she would like another referral.  No lightheadedness/near syncope.  As far as he knows, BP is running good at home but he's not checking it.    PAIN:  Are you having pain? No  FALLS: Has patient fallen in last 6 months?  Yes, Number of falls: 1 "full fall", and two where pt caught himself before going to the floor  LIVING ENVIRONMENT: Lives with: lives with their spouse Lives in: House/apartment  PLOF:  Level of assistance: Independent with ADLs, Independent with IADLs Employment: Retired  PATIENT GOALS: Improve communication effectiveness  OBJECTIVE:   COGNITION: Overall cognitive status: Impaired Areas of impairment: Attention and Memory Comments: Pt provides example of "completely gone" feeling (losing his desired message) when in conversation.  MOTOR SPEECH: Overall motor speech: impaired Level of impairment: Phrase Respiration: thoracic breathing and diaphragmatic/abdominal breathing Phonation: low vocal intensity Resonance: WFL Articulation: Appears intact Intelligibility: Intelligible Motor planning: Appears intact Effective technique: increased vocal intensity  ORAL MOTOR  EXAMINATION: Overall status: Impaired: Labial: Bilateral (ROM and Coordination) Lingual: Bilateral (ROM and Coordination) Facial: Bilateral (ROM) Comments: labial and lingual ROM was improved with a model or with tactile cues  OBJECTIVE VOICE ASSESSMENT: Sustained "ah" maximum phonation time: 7.8 seconds Sustained "ah" loudness average: 84 dB SLP suggested tactile cue of hand on abdomen to assist proper  /a/ production Oral reading (passage) loudness average:  67dB Oral reading loudness range: 56-80 dB Conversational loudness average: 56-76 dB Conversational loudness range: 67 dB Voice quality: breathy and low vocal intensity Stimulability trials: Given SLP modeling and usual min-mod cues, loudness average increased to 72dB (range of 68 to 76) at sentence response level. At conversation level, with usual mod cues pt was unable to improve conversational loudness in 45 seconds conversation.  Pt does not report difficulty with swallowing but pt's swallowing may warrant further evaluation, due to pt's awareness may be decr'd re: swallowing deficits, so SLP will monitor pt's swallowing during sessions . A bedside swallow eval may be initiated and a MBS may be warranted in this therapy course.  PATIENT REPORTED OUTCOME MEASURES (PROM): Communication Effectiveness Survey: to be administered during first 2 sessions  TODAY'S TREATMENT:                                                                                                                                         DATE:  06/07/23: SLP shaped pt's /a/ today and explained rationale for loud /a/ (explained to pt as "strong /a/" to minimize vocal strain and laryngeal effort). SLP explained treatment options and pt chose a less-programmed option (a plan that would NOT include SpeakOut!).   PATIENT EDUCATION: Education details: see "today's treatment" Person educated: Patient Education method: Explanation, Demonstration, Verbal cues, and Handouts Education comprehension: verbalized understanding, returned demonstration, verbal cues required, and needs further education  HOME EXERCISE PROGRAM: 10 strong "ah" BID. Eventually, pt will complete glides and everyday sentences, when and if clinically appropriate.   GOALS: Goals reviewed with patient? No  SHORT TERM GOALS: Target date: 07/13/23  pt will produce loud /a/ or "hey!" with at least upper 80s  dB average over three sessions Baseline: Goal status: INITIAL  2.  Pt will produce 16/20 sentence responses with WNL volume over two sessions Baseline:  Goal status: INITIAL  3.  Pt will produce 3 minutes simple conversation with WNL volume over three sessions Baseline:  Goal status: INITIAL  4.  Pt will tell SLP benefit of brain-stimulating tasks (eg, "constant therapy", "lumosity", sudoku, etc) Baseline:  Goal status: INITIAL  5.  Pt will undergo bedside swallow assessment if clinically indicated a Baseline:  Goal status: INITIAL   LONG TERM GOALS: Target date: 08/12/23  Pt will undergo MBS if clinically indicated Baseline:  Goal status: INITIAL  2.  Pt will produce 10 minutes simple-mod complex conversation with WNL volume given rare nonverbal cues over three sessions Baseline:  Goal status: INITIAL  3.  Pt's PROM will show improvement from initial administration Baseline:  Goal status: INITIAL  4.  Pt will report utilizing memory strategies to improve overall memory, in or between 3 sessions  Baseline:  Goal status: INITIAL  ASSESSMENT:  CLINICAL IMPRESSION: Patient is a 72 y.o. M who was seen today for assessment of speech skills in light of his Parkinson disease diagnosed approx 8 years ago. He endorses people have demonstrated they are having more difficulty understanding him in the last 6 months. He denies overt s/sx dysphagia at this time however often times pts with PD are less aware of deficits so SLP will monitor swallow function noted during sessions and may refer for MBS later in pt's therapy course. He opted for a less-regimented therapy plan (one not including "SpeakOut"). Pt did not want to schedule ST appointments today, so SLP asked pt to call in the next 7 days and schedule appointments. If no appointments are scheduled after 7 days (06/14/23), SLP will d/c this plan of care. Pt agrees with this plan and he was provided homework (10 strong "ah" BID) and  this information as he left the clinic today.  OBJECTIVE IMPAIRMENTS: Objective impairments include attention, memory, dysarthria, and dysphagia. These impairments are limiting patient from managing medications, managing appointments, managing finances, household responsibilities, ADLs/IADLs, effectively communicating at home and in community, and safety when swallowing.Factors affecting potential to achieve goals and functional outcome are ability to learn/carryover information, cooperation/participation level, and previous level of function.. Patient will benefit from skilled SLP services to address above impairments and improve overall function.  REHAB POTENTIAL: Good  PLAN:  SLP FREQUENCY: 2x/week  SLP DURATION: 8 weeks  PLANNED INTERVENTIONS: Aspiration precaution training, Pharyngeal strengthening exercises, Diet toleration management , Environmental controls, Trials of upgraded texture/liquids, Cueing hierachy, Cognitive reorganization, Internal/external aids, Functional tasks, SLP instruction and feedback, Compensatory strategies, Patient/family education, and MBS (possible)    Xariah Silvernail, CCC-SLP 06/07/2023, 2:03 PM

## 2023-06-07 NOTE — Patient Instructions (Addendum)
    Do 10 strong "AHHHHHHHHHHHHHH"s twice a day  Give [place] a call back to schedule appointments - I want you to schedule x2/week x8 weeks  If we don't hear back when you want to schedule appointments by 06/14/23 I will just discharge this current plan of care, and we can see you back after we receive another prescription.

## 2023-06-08 ENCOUNTER — Other Ambulatory Visit: Payer: Self-pay | Admitting: Neurology

## 2023-06-08 DIAGNOSIS — F330 Major depressive disorder, recurrent, mild: Secondary | ICD-10-CM

## 2023-06-13 ENCOUNTER — Ambulatory Visit: Payer: Medicare HMO

## 2023-06-13 DIAGNOSIS — R41841 Cognitive communication deficit: Secondary | ICD-10-CM

## 2023-06-13 DIAGNOSIS — R1310 Dysphagia, unspecified: Secondary | ICD-10-CM | POA: Diagnosis not present

## 2023-06-13 DIAGNOSIS — R471 Dysarthria and anarthria: Secondary | ICD-10-CM

## 2023-06-13 NOTE — Therapy (Signed)
OUTPATIENT SPEECH LANGUAGE PATHOLOGY TREATMENT   Patient Name: Jeremy Davidson MRN: [id number] DOB:12/03/1950, 72 y.o., male Today's Date: 06/13/2023  PCP: Darrow Bussing, MD REFERRING PROVIDER: Darrow Bussing, MD   END OF SESSION:  End of Session - 06/13/23 1131     Visit Number 2    Number of Visits 17    Date for SLP Re-Evaluation 08/12/23    SLP Start Time 1106    SLP Stop Time  1146    SLP Time Calculation (min) 40 min    Activity Tolerance Patient tolerated treatment well              Past Medical History:  Diagnosis Date   Blood in stool 2010   Colonoscopy benign repeat 2015   Elevated cholesterol    Hypertension    Parkinson's disease 03/17/2017   Past Surgical History:  Procedure Laterality Date   HERNIA REPAIR  06/23/2010   With large ultra pro hrnia system-- Dr Dwain Sarna 1971. x2   Patient Active Problem List   Diagnosis Date Noted   Viral URI with cough 05/23/2022   Acute metabolic encephalopathy 05/23/2022   Difficulty walking 05/23/2022   Hyponatremia 05/23/2022   Parkinson's disease 03/17/2017   Essential hypertension 09/09/2015   Hyperlipidemia 09/09/2015    ONSET DATE: Approx 8 years ago, script dated 05-24-23  REFERRING DIAG: PD with dyskinesia  THERAPY DIAG:  Dysarthria and anarthria  Cognitive communication deficit  Dysphagia, unspecified type  Rationale for Evaluation and Treatment: Rehabilitation  SUBJECTIVE:   SUBJECTIVE STATEMENT: Pt did not realize when he spoke with a "stronger voice" that hoarseness diminished. Pt accompanied by: self  PERTINENT HISTORY: From Dr. Don Perking note 03/08/23: Jeremy Davidson was seen today in follow up for Parkinsons disease.  My previous records were reviewed prior to todays visit as well as outside records available to me.  Wife supplements history.  We slightly increased his levodopa last visit.  He tolerated that well, without side effects.  Last visit, he was having a lot of back pain and  referred him to physical therapy.  It looks like he only went twice and never went back.  We also referred him for speech therapy and he went 3 times and did not want to go back.  He states he wasn't sure it helped but wife does think it does and she would like another referral.  No lightheadedness/near syncope.  As far as he knows, BP is running good at home but he's not checking it.    PAIN:  Are you having pain? No  FALLS: Has patient fallen in last 6 months?  Yes, Number of falls: 1 "full fall", and two where pt caught himself before going to the floor  PATIENT GOALS: Improve communication effectiveness  OBJECTIVE:   COGNITION: Overall cognitive status: Impaired Areas of impairment: Attention and Memory Comments: Pt provides example of "completely gone" feeling (losing his desired message) when in conversation.  Pt does not report difficulty with swallowing but pt's swallowing may warrant further evaluation, due to pt's awareness may be decr'd re: swallowing deficits, so SLP will monitor pt's swallowing during sessions . A bedside swallow eval may be initiated and a MBS may be warranted in this therapy course.  PATIENT REPORTED OUTCOME MEASURES (PROM): Communication Effectiveness Survey: to be administered today and pt answered stimuli to score 21/32 (higher scores=more effective communication).  TODAY'S TREATMENT:  DATE:  06/13/23: Pt demo'd sx of reduced working memory as he req'd cues spelling 3-4 letter words backwards today. Pt may benefit from formal comm-cog evaluation.  Generated /a/ with average mid 80s dB with occasional mod A for loudness/strength. In phrase responses (pt unable to adequately read information due to cataracts (sx upcoming) pt req'd usual min-mod cues faded to occasional min-mod cues for strength and loudness. Pt faded loudness  at end of phrases 50% of the time.  SLP administered PROM today with results above.   06/07/23: SLP shaped pt's /a/ today and explained rationale for loud /a/ (explained to pt as "strong /a/" to minimize vocal strain and laryngeal effort). SLP explained treatment options and pt chose a less-programmed option (a plan that would NOT include SpeakOut!).   PATIENT EDUCATION: Education details: see "today's treatment" Person educated: Patient Education method: Explanation, Demonstration, Verbal cues, and Handouts Education comprehension: verbalized understanding, returned demonstration, verbal cues required, and needs further education  HOME EXERCISE PROGRAM: 10 strong "ah" BID. Eventually, pt will complete glides and everyday sentences, when and if clinically appropriate.   GOALS: Goals reviewed with patient? No  SHORT TERM GOALS: Target date: 07/13/23  pt will produce loud /a/ or "hey!" with at least upper 80s dB average over three sessions Baseline: Goal status: INITIAL  2.  Pt will produce 16/20 sentence responses with WNL volume over two sessions Baseline:  Goal status: INITIAL  3.  Pt will produce 3 minutes simple conversation with WNL volume over three sessions Baseline:  Goal status: INITIAL  4.  Pt will tell SLP benefit of brain-stimulating tasks (eg, "constant therapy", "lumosity", sudoku, etc) Baseline:  Goal status: INITIAL  5.  Pt will undergo bedside swallow assessment if clinically indicated a Baseline:  Goal status: INITIAL   LONG TERM GOALS: Target date: 08/12/23  Pt will undergo MBS if clinically indicated Baseline:  Goal status: INITIAL  2.  Pt will produce 10 minutes simple-mod complex conversation with WNL volume given rare nonverbal cues over three sessions Baseline:  Goal status: INITIAL  3.  Pt's PROM will show improvement from initial administration Baseline:  Goal status: INITIAL  4.  Pt will report utilizing memory strategies to improve  overall memory, in or between 3 sessions  Baseline:  Goal status: INITIAL  ASSESSMENT:  CLINICAL IMPRESSION: Patient is a 72 y.o. M who was seen today for treatment of speech skills in light of his Parkinson disease diagnosed approx 8 years ago. Pt may benefit from formal cognitive communication evaluation as working memory was seen as problematic for pt today. SLP to cont to monitor.   OBJECTIVE IMPAIRMENTS: Objective impairments include attention, memory, dysarthria, and dysphagia. These impairments are limiting patient from managing medications, managing appointments, managing finances, household responsibilities, ADLs/IADLs, effectively communicating at home and in community, and safety when swallowing.Factors affecting potential to achieve goals and functional outcome are ability to learn/carryover information, cooperation/participation level, and previous level of function.. Patient will benefit from skilled SLP services to address above impairments and improve overall function.  REHAB POTENTIAL: Good  PLAN:  SLP FREQUENCY: 2x/week  SLP DURATION: 8 weeks  PLANNED INTERVENTIONS: Aspiration precaution training, Pharyngeal strengthening exercises, Diet toleration management , Environmental controls, Trials of upgraded texture/liquids, Cueing hierachy, Cognitive reorganization, Internal/external aids, Functional tasks, SLP instruction and feedback, Compensatory strategies, Patient/family education, and MBS (possible)    Kyree Adriano, CCC-SLP 06/13/2023, 1:24 PM

## 2023-06-20 ENCOUNTER — Ambulatory Visit: Payer: Medicare HMO | Attending: Family Medicine

## 2023-06-20 DIAGNOSIS — R471 Dysarthria and anarthria: Secondary | ICD-10-CM | POA: Diagnosis not present

## 2023-06-20 DIAGNOSIS — R1310 Dysphagia, unspecified: Secondary | ICD-10-CM | POA: Diagnosis not present

## 2023-06-20 DIAGNOSIS — R41841 Cognitive communication deficit: Secondary | ICD-10-CM | POA: Insufficient documentation

## 2023-06-20 NOTE — Therapy (Signed)
OUTPATIENT SPEECH LANGUAGE PATHOLOGY TREATMENT   Patient Name: Jeremy Davidson MRN: [id number] DOB:Jan 27, 1951, 72 y.o., male Today's Date: 06/20/2023  PCP: Darrow Bussing, MD REFERRING PROVIDER: Darrow Bussing, MD   END OF SESSION:  End of Session - 06/20/23 1204     Visit Number 3    Number of Visits 17    Date for SLP Re-Evaluation 08/12/23    SLP Start Time 1153    SLP Stop Time  1235    SLP Time Calculation (min) 42 min    Activity Tolerance Patient tolerated treatment well              Past Medical History:  Diagnosis Date   Blood in stool 2010   Colonoscopy benign repeat 2015   Elevated cholesterol    Hypertension    Parkinson's disease 03/17/2017   Past Surgical History:  Procedure Laterality Date   HERNIA REPAIR  06/23/2010   With large ultra pro hrnia system-- Dr Dwain Sarna 1971. x2   Patient Active Problem List   Diagnosis Date Noted   Viral URI with cough 05/23/2022   Acute metabolic encephalopathy 05/23/2022   Difficulty walking 05/23/2022   Hyponatremia 05/23/2022   Parkinson's disease 03/17/2017   Essential hypertension 09/09/2015   Hyperlipidemia 09/09/2015    ONSET DATE: Approx 8 years ago, script dated 05-24-23  REFERRING DIAG: PD with dyskinesia  THERAPY DIAG:  Dysarthria and anarthria  Cognitive communication deficit  Dysphagia, unspecified type  Rationale for Evaluation and Treatment: Rehabilitation  SUBJECTIVE:   SUBJECTIVE STATEMENT: "So I do a loud "ah" between each of these?" (10 minutes after explaining homework was "ah" x10 separately from practice tasks)  Pt accompanied by: family member daughter   PERTINENT HISTORY: From Dr. Don Perking note 03/08/23: [name] was seen today in follow up for Parkinsons disease.  My previous records were reviewed prior to todays visit as well as outside records available to me.  Wife supplements history.  We slightly increased his levodopa last visit.  He tolerated that well, without  side effects.  Last visit, he was having a lot of back pain and referred him to physical therapy.  It looks like he only went twice and never went back.  We also referred him for speech therapy and he went 3 times and did not want to go back.  He states he wasn't sure it helped but wife does think it does and she would like another referral.  No lightheadedness/near syncope.  As far as he knows, BP is running good at home but he's not checking it.    PAIN:  Are you having pain? No  FALLS: Has patient fallen in last 6 months?  Yes, Number of falls: 1 "full fall", and two where pt caught himself before going to the floor  PATIENT GOALS: Improve communication effectiveness  OBJECTIVE:   COGNITION: Overall cognitive status: Impaired Areas of impairment: Attention and Memory Comments: Pt provides example of "completely gone" feeling (losing his desired message) when in conversation.  Pt does not report difficulty with swallowing but pt's swallowing may warrant further evaluation, due to pt's awareness may be decr'd re: swallowing deficits, so SLP will monitor pt's swallowing during sessions . A bedside swallow eval may be initiated and a MBS may be warranted in this therapy course.  PATIENT REPORTED OUTCOME MEASURES (PROM): Communication Effectiveness Survey: to be administered today and pt answered stimuli to score 21/32 (higher scores=more effective communication).  TODAY'S TREATMENT:  DATE:  06/20/23: SLP began by explaining rationale for loud /a/ for daughter, and explaining rationale for daily practice sessions at home. Suggested pt do 20-25 minutes BID, separate from loud /a/. During session SLP also explained that due to pt's cognition he may have more difficulty with mod complex conversation and staying at WNL volume.  In divergent naming task (concrete  categories) pt req'd usual mod cues for loudness faded to occasional min-mod cues. By session end, pt began indicating when he did not maintain loudness throughout the utterance. SLP pointed this out to pt and daughter and used it as reinforcement for daily practice with /a/ and 20-25 minutes BID. Daughter typed during session as SLP explained things to pt (re-explained) and daughter.  06/13/23: Pt demo'd sx of reduced working memory as he req'd cues spelling 3-4 letter words backwards today. Pt may benefit from formal comm-cog evaluation.  Generated /a/ with average mid 80s dB with occasional mod A for loudness/strength. In phrase responses (pt unable to adequately read information due to cataracts (sx upcoming) pt req'd usual min-mod cues faded to occasional min-mod cues for strength and loudness. Pt faded loudness at end of phrases 50% of the time.  SLP administered PROM today with results above.   06/07/23: SLP shaped pt's /a/ today and explained rationale for loud /a/ (explained to pt as "strong /a/" to minimize vocal strain and laryngeal effort). SLP explained treatment options and pt chose a less-programmed option (a plan that would NOT include SpeakOut!).   PATIENT EDUCATION: Education details: see "today's treatment" Person educated: Patient and daughter Education method: Explanation, Demonstration, Verbal cues, and Handouts Education comprehension: verbalized understanding, returned demonstration, verbal cues required, and needs further education  HOME EXERCISE PROGRAM: 10 strong "ah" BID. Eventually, pt will complete glides and everyday sentences, when and if clinically appropriate.   GOALS: Goals reviewed with patient? No  SHORT TERM GOALS: Target date: 07/13/23  pt will produce loud /a/ or "hey!" with at least upper 80s dB average over three sessions Baseline: Goal status: INITIAL  2.  Pt will produce 16/20 sentence responses with WNL volume over two sessions Baseline:  Goal  status: INITIAL  3.  Pt will produce 3 minutes simple conversation with WNL volume over three sessions Baseline:  Goal status: INITIAL  4.  Pt will tell SLP benefit of brain-stimulating tasks (eg, "constant therapy", "lumosity", sudoku, etc) Baseline:  Goal status: INITIAL  5.  Pt will undergo bedside swallow assessment if clinically indicated a Baseline:  Goal status: INITIAL   LONG TERM GOALS: Target date: 08/12/23  Pt will undergo MBS if clinically indicated Baseline:  Goal status: INITIAL  2.  Pt will produce 10 minutes simple-mod complex conversation with WNL volume given rare nonverbal cues over three sessions Baseline:  Goal status: INITIAL  3.  Pt's PROM will show improvement from initial administration Baseline:  Goal status: INITIAL  4.  Pt will report utilizing memory strategies to improve overall memory, in or between 3 sessions  Baseline:  Goal status: INITIAL  ASSESSMENT:  CLINICAL IMPRESSION: Patient is a 72 y.o. M who was seen today for treatment of speech skills in light of his Parkinson disease diagnosed approx 8 years ago. Pt memory was seen as challenging today. See "today's treatment" for details. SLP to cont to monitor - pt may require formal cognitive communication eval at some point.   OBJECTIVE IMPAIRMENTS: Objective impairments include attention, memory, dysarthria, and dysphagia. These impairments are limiting patient from managing medications, managing appointments,  managing finances, household responsibilities, ADLs/IADLs, effectively communicating at home and in community, and safety when swallowing.Factors affecting potential to achieve goals and functional outcome are ability to learn/carryover information, cooperation/participation level, and previous level of function.. Patient will benefit from skilled SLP services to address above impairments and improve overall function.  REHAB POTENTIAL: Good  PLAN:  SLP FREQUENCY: 2x/week  SLP  DURATION: 8 weeks  PLANNED INTERVENTIONS: Aspiration precaution training, Pharyngeal strengthening exercises, Diet toleration management , Environmental controls, Trials of upgraded texture/liquids, Cueing hierachy, Cognitive reorganization, Internal/external aids, Functional tasks, SLP instruction and feedback, Compensatory strategies, Patient/family education, and MBS (possible)    Tiye Huwe, CCC-SLP 06/20/2023, 5:35 PM

## 2023-06-23 ENCOUNTER — Ambulatory Visit: Payer: Medicare HMO

## 2023-06-23 DIAGNOSIS — R1310 Dysphagia, unspecified: Secondary | ICD-10-CM

## 2023-06-23 DIAGNOSIS — R41841 Cognitive communication deficit: Secondary | ICD-10-CM | POA: Diagnosis not present

## 2023-06-23 DIAGNOSIS — R471 Dysarthria and anarthria: Secondary | ICD-10-CM

## 2023-06-23 NOTE — Therapy (Signed)
OUTPATIENT SPEECH LANGUAGE PATHOLOGY TREATMENT   Patient Name: Jeremy Davidson MRN: [id number] DOB:May 10, 1951, 72 y.o., male Today's Date: 06/23/2023  PCP: Darrow Bussing, MD REFERRING PROVIDER: Darrow Bussing, MD   END OF SESSION:  End of Session - 06/23/23 1218     Visit Number 4    Number of Visits 17    Date for SLP Re-Evaluation 08/12/23    SLP Start Time 0851    SLP Stop Time  0931    SLP Time Calculation (min) 40 min    Activity Tolerance Patient tolerated treatment well               Past Medical History:  Diagnosis Date   Blood in stool 2010   Colonoscopy benign repeat 2015   Elevated cholesterol    Hypertension    Parkinson's disease 03/17/2017   Past Surgical History:  Procedure Laterality Date   HERNIA REPAIR  06/23/2010   With large ultra pro hrnia system-- Dr Dwain Sarna 1971. x2   Patient Active Problem List   Diagnosis Date Noted   Viral URI with cough 05/23/2022   Acute metabolic encephalopathy 05/23/2022   Difficulty walking 05/23/2022   Hyponatremia 05/23/2022   Parkinson's disease 03/17/2017   Essential hypertension 09/09/2015   Hyperlipidemia 09/09/2015    ONSET DATE: Approx 8 years ago, script dated 05-24-23  REFERRING DIAG: PD with dyskinesia  THERAPY DIAG:  Dysarthria and anarthria  Cognitive communication deficit  Dysphagia, unspecified type  Rationale for Evaluation and Treatment: Rehabilitation  SUBJECTIVE:   SUBJECTIVE STATEMENT: "I see less of this (leaning in)."  Pt accompanied by: family member daughter   PERTINENT HISTORY: From Dr. Don Perking note 03/08/23: [name] was seen today in follow up for Parkinsons disease.  My previous records were reviewed prior to todays visit as well as outside records available to me.  Wife supplements history.  We slightly increased his levodopa last visit.  He tolerated that well, without side effects.  Last visit, he was having a lot of back pain and referred him to physical  therapy.  It looks like he only went twice and never went back.  We also referred him for speech therapy and he went 3 times and did not want to go back.  He states he wasn't sure it helped but wife does think it does and she would like another referral.  No lightheadedness/near syncope.  As far as he knows, BP is running good at home but he's not checking it.    PAIN:  Are you having pain? No  FALLS: Has patient fallen in last 6 months?  Yes, Number of falls: 1 "full fall", and two where pt caught himself before going to the floor  PATIENT GOALS: Improve communication effectiveness  OBJECTIVE:   COGNITION: Overall cognitive status: Impaired Areas of impairment: Attention and Memory Comments: Pt provides example of "completely gone" feeling (losing his desired message) when in conversation.  Pt does not report difficulty with swallowing but pt's swallowing may warrant further evaluation, due to pt's awareness may be decr'd re: swallowing deficits, so SLP will monitor pt's swallowing during sessions . A bedside swallow eval may be initiated and a MBS may be warranted in this therapy course.  PATIENT REPORTED OUTCOME MEASURES (PROM): Communication Effectiveness Survey: to be administered today and pt answered stimuli to score 21/32 (higher scores=more effective communication).  TODAY'S TREATMENT:  DATE:  06/23/23: SLP reiterated the need for daily practice and provided rationale for this. Daughter tells SLP pt does not exhibit any difficulty with swallowing sx during meals, but wonders (from pt's wife) if there's anything that can be done to prevent swallowing issues. SLP suggested 10 effortful swallows BID. Pt tells SLP that he is using more abdominal musculature recruitment than before ST was initiated, and thus "S" statement. SLP shared about communication at  [employer] and communicating with wait staff are also good barometers for assessing if pt is speaking louder.  Today, pt's /a/ averaged 84dB, with occasional min A for loudness. Simple word level responses were  highly dependent upon cognitive load (naming one memober from a simple/common category) Occasional increased to usual min cues needed due to mental fatigue - average mid -upper 60s dB. Answering simple questions re: seasons, months, holidays with one word answers req'd occasional mod cues -average was mid 60s dB. SLP provided homework and daughter stated she could assist pt as necessary with reading and answering.   06/20/23: SLP began by explaining rationale for loud /a/ for daughter, and explaining rationale for daily practice sessions at home. Suggested pt do 20-25 minutes BID, separate from loud /a/. During session SLP also explained that due to pt's cognition he may have more difficulty with mod complex conversation and staying at WNL volume.  In divergent naming task (concrete categories) pt req'd usual mod cues for loudness faded to occasional min-mod cues. By session end, pt began indicating when he did not maintain loudness throughout the utterance. SLP pointed this out to pt and daughter and used it as reinforcement for daily practice with /a/ and 20-25 minutes BID. Daughter typed during session as SLP explained things to pt (re-explained) and daughter.  06/13/23: Pt demo'd sx of reduced working memory as he req'd cues spelling 3-4 letter words backwards today. Pt may benefit from formal comm-cog evaluation.  Generated /a/ with average mid 80s dB with occasional mod A for loudness/strength. In phrase responses (pt unable to adequately read information due to cataracts (sx upcoming) pt req'd usual min-mod cues faded to occasional min-mod cues for strength and loudness. Pt faded loudness at end of phrases 50% of the time.  SLP administered PROM today with results above.   06/07/23: SLP shaped  pt's /a/ today and explained rationale for loud /a/ (explained to pt as "strong /a/" to minimize vocal strain and laryngeal effort). SLP explained treatment options and pt chose a less-programmed option (a plan that would NOT include SpeakOut!).   PATIENT EDUCATION: Education details: see "today's treatment" Person educated: Patient and daughter Education method: Explanation, Demonstration, Verbal cues, and Handouts Education comprehension: verbalized understanding, returned demonstration, verbal cues required, and needs further education  HOME EXERCISE PROGRAM: 10 strong "ah" BID. Eventually, pt will complete glides and everyday sentences, when and if clinically appropriate.   GOALS: Goals reviewed with patient? No  SHORT TERM GOALS: Target date: 07/13/23  pt will produce loud /a/ or "hey!" with at least upper 80s dB average over three sessions Baseline: Goal status: INITIAL  2.  Pt will produce 16/20 sentence responses with WNL volume over two sessions Baseline:  Goal status: INITIAL  3.  Pt will produce 3 minutes simple conversation with WNL volume over three sessions Baseline:  Goal status: INITIAL  4.  Pt will tell SLP benefit of brain-stimulating tasks (eg, "constant therapy", "lumosity", sudoku, etc) Baseline:  Goal status: INITIAL  5.  Pt will undergo bedside swallow assessment  if clinically indicated a Baseline:  Goal status: INITIAL   LONG TERM GOALS: Target date: 08/12/23  Pt will undergo MBS if clinically indicated Baseline:  Goal status: INITIAL  2.  Pt will produce 10 minutes simple-mod complex conversation with WNL volume given rare nonverbal cues over three sessions Baseline:  Goal status: INITIAL  3.  Pt's PROM will show improvement from initial administration Baseline:  Goal status: INITIAL  4.  Pt will report utilizing memory strategies to improve overall memory, in or between 3 sessions  Baseline:  Goal status:  INITIAL  ASSESSMENT:  CLINICAL IMPRESSION: Patient is a 72 y.o. M who was seen today for treatment of speech skills in light of his Parkinson disease diagnosed approx 8 years ago. Pt progress in tx highly dependent upon cognitive load necessary to participate in dyad conversation/responses. See "today's treatment" for details. SLP to cont to monitor - pt may require formal cognitive communication eval at some point.   OBJECTIVE IMPAIRMENTS: Objective impairments include attention, memory, dysarthria, and dysphagia. These impairments are limiting patient from managing medications, managing appointments, managing finances, household responsibilities, ADLs/IADLs, effectively communicating at home and in community, and safety when swallowing.Factors affecting potential to achieve goals and functional outcome are ability to learn/carryover information, cooperation/participation level, and previous level of function.. Patient will benefit from skilled SLP services to address above impairments and improve overall function.  REHAB POTENTIAL: Good  PLAN:  SLP FREQUENCY: 2x/week  SLP DURATION: 8 weeks  PLANNED INTERVENTIONS: Aspiration precaution training, Pharyngeal strengthening exercises, Diet toleration management , Environmental controls, Trials of upgraded texture/liquids, Cueing hierachy, Cognitive reorganization, Internal/external aids, Functional tasks, SLP instruction and feedback, Compensatory strategies, Patient/family education, and MBS (possible)    Ragena Fiola, CCC-SLP 06/23/2023, 12:18 PM

## 2023-06-27 ENCOUNTER — Ambulatory Visit: Payer: Medicare HMO

## 2023-06-27 DIAGNOSIS — R1310 Dysphagia, unspecified: Secondary | ICD-10-CM | POA: Diagnosis not present

## 2023-06-27 DIAGNOSIS — R41841 Cognitive communication deficit: Secondary | ICD-10-CM | POA: Diagnosis not present

## 2023-06-27 DIAGNOSIS — R471 Dysarthria and anarthria: Secondary | ICD-10-CM | POA: Diagnosis not present

## 2023-06-27 NOTE — Therapy (Signed)
OUTPATIENT SPEECH LANGUAGE PATHOLOGY TREATMENT   Patient Name: Jeremy Davidson MRN: [id number] DOB:04-01-1951, 72 y.o., male Today's Date: 06/27/2023  PCP: Darrow Bussing, MD REFERRING PROVIDER: Darrow Bussing, MD   END OF SESSION:  End of Session - 06/27/23 1024     Visit Number 5    Number of Visits 17    Date for SLP Re-Evaluation 08/12/23    SLP Start Time 1018    SLP Stop Time  1100    SLP Time Calculation (min) 42 min    Activity Tolerance Patient tolerated treatment well               Past Medical History:  Diagnosis Date   Blood in stool 2010   Colonoscopy benign repeat 2015   Elevated cholesterol    Hypertension    Parkinson's disease 03/17/2017   Past Surgical History:  Procedure Laterality Date   HERNIA REPAIR  06/23/2010   With large ultra pro hrnia system-- Dr Dwain Sarna 1971. x2   Patient Active Problem List   Diagnosis Date Noted   Viral URI with cough 05/23/2022   Acute metabolic encephalopathy 05/23/2022   Difficulty walking 05/23/2022   Hyponatremia 05/23/2022   Parkinson's disease 03/17/2017   Essential hypertension 09/09/2015   Hyperlipidemia 09/09/2015    ONSET DATE: Approx 8 years ago, script dated 05-24-23  REFERRING DIAG: PD with dyskinesia  THERAPY DIAG:  Dysarthria and anarthria  Cognitive communication deficit  Dysphagia, unspecified type  Rationale for Evaluation and Treatment: Rehabilitation  SUBJECTIVE:   SUBJECTIVE STATEMENT: "My wife went to [employer]. She may try to peak her head in here."  Pt accompanied by: self   PERTINENT HISTORY: From Dr. Don Perking note 03/08/23: [name] was seen today in follow up for Parkinsons disease.  My previous records were reviewed prior to todays visit as well as outside records available to me.  Wife supplements history.  We slightly increased his levodopa last visit.  He tolerated that well, without side effects.  Last visit, he was having a lot of back pain and referred  him to physical therapy.  It looks like he only went twice and never went back.  We also referred him for speech therapy and he went 3 times and did not want to go back.  He states he wasn't sure it helped but wife does think it does and she would like another referral.  No lightheadedness/near syncope.  As far as he knows, BP is running good at home but he's not checking it.    PAIN:  Are you having pain? No  FALLS: Has patient fallen in last 6 months?  Yes, Number of falls: 1 "full fall", and two where pt caught himself before going to the floor  PATIENT GOALS: Improve communication effectiveness  OBJECTIVE:   COGNITION: Overall cognitive status: Impaired Areas of impairment: Attention and Memory Comments: Pt provides example of "completely gone" feeling (losing his desired message) when in conversation.  Pt does not report difficulty with swallowing but pt's swallowing may warrant further evaluation, due to pt's awareness may be decr'd re: swallowing deficits, so SLP will monitor pt's swallowing during sessions . A bedside swallow eval may be initiated and a MBS may be warranted in this therapy course.  PATIENT REPORTED OUTCOME MEASURES (PROM): Communication Effectiveness Survey: to be administered today and pt answered stimuli to score 21/32 (higher scores=more effective communication).  TODAY'S TREATMENT:  DATE:  06/27/23: SLP acknowledged pt's wife should attend therapy with pt whenever possible. SLP  asked about pt's swallowing status again (first time asked was at eval), and pt mistakenly answered about his SPEAKING; SLP redirected Christy to answer SLP's question about swallowing and he denied noticing throat clearing and/or coughing with POs. SLP to cont to monitor. Pt states he is performing loud /a/ and practicing louder speech at home with  responses worksheets "not as much as (is prescribed) but I'm doing some." SLP strongly reiterated the need for consistent practice to enhance positive change in speech loudness.  SLP assisted pt with loud /a/, providing consistent mod cues for full breath and for loudness throughout the 10 reps. SLP could not fade cues, as pt req'd mod cues the entire task. SLP then had pt read 50 common conversational sentences. At first pt volume was in mid to upper 60s dB, and he req'd occasional min-mod A for loudness. On the third time through, pt became more consistent with loudness in mid 70s dB. SLP again strongly reiterated consistency with practicing louder speech with responses on worksheets for 20 minutes BID.   06/23/23: SLP reiterated the need for daily practice and provided rationale for this. Daughter tells SLP pt does not exhibit any difficulty with swallowing sx during meals, but wonders (from pt's wife) if there's anything that can be done to prevent swallowing issues. SLP suggested 10 effortful swallows BID. Pt tells SLP that he is using more abdominal musculature recruitment than before ST was initiated, and thus "S" statement. SLP shared about communication at drive thrus and communicating with wait staff are also good barometers for assessing if pt is speaking louder.  Today, pt's /a/ averaged 84dB, with occasional min A for loudness. Simple word level responses were  highly dependent upon cognitive load (naming one memober from a simple/common category) Occasional increased to usual min cues needed due to mental fatigue - average mid -upper 60s dB. Answering simple questions re: seasons, months, holidays with one word answers req'd occasional mod cues -average was mid 60s dB. SLP provided homework and daughter stated she could assist pt as necessary with reading and answering.   06/20/23: SLP began by explaining rationale for loud /a/ for daughter, and explaining rationale for daily practice sessions at  home. Suggested pt do 20-25 minutes BID, separate from loud /a/. During session SLP also explained that due to pt's cognition he may have more difficulty with mod complex conversation and staying at WNL volume.  In divergent naming task (concrete categories) pt req'd usual mod cues for loudness faded to occasional min-mod cues. By session end, pt began indicating when he did not maintain loudness throughout the utterance. SLP pointed this out to pt and daughter and used it as reinforcement for daily practice with /a/ and 20-25 minutes BID. Daughter typed during session as SLP explained things to pt (re-explained) and daughter.  06/13/23: Pt demo'd sx of reduced working memory as he req'd cues spelling 3-4 letter words backwards today. Pt may benefit from formal comm-cog evaluation.  Generated /a/ with average mid 80s dB with occasional mod A for loudness/strength. In phrase responses (pt unable to adequately read information due to cataracts (sx upcoming) pt req'd usual min-mod cues faded to occasional min-mod cues for strength and loudness. Pt faded loudness at end of phrases 50% of the time.  SLP administered PROM today with results above.   06/07/23: SLP shaped pt's /a/ today and explained rationale for loud /a/ (explained  to pt as "strong /a/" to minimize vocal strain and laryngeal effort). SLP explained treatment options and pt chose a less-programmed option (a plan that would NOT include SpeakOut!).   PATIENT EDUCATION: Education details: see "today's treatment" Person educated: Patient and daughter Education method: Explanation, Demonstration, Verbal cues, and Handouts Education comprehension: verbalized understanding, returned demonstration, verbal cues required, and needs further education  HOME EXERCISE PROGRAM: 10 strong "ah" BID. Eventually, pt will complete glides and everyday sentences, when and if clinically appropriate.   GOALS: Goals reviewed with patient? No  SHORT TERM GOALS:  Target date: 07/13/23  pt will produce loud /a/ or "hey!" with at least upper 80s dB average over three sessions Baseline: Goal status: INITIAL  2.  Pt will produce 16/20 sentence responses with WNL volume over two sessions Baseline:  Goal status: INITIAL  3.  Pt will produce 3 minutes simple conversation with WNL volume over three sessions Baseline:  Goal status: INITIAL  4.  Pt will tell SLP benefit of brain-stimulating tasks (eg, "constant therapy", "lumosity", sudoku, etc) Baseline:  Goal status: INITIAL  5.  Pt will undergo bedside swallow assessment if clinically indicated a Baseline:  Goal status: INITIAL   LONG TERM GOALS: Target date: 08/12/23  Pt will undergo MBS if clinically indicated Baseline:  Goal status: INITIAL  2.  Pt will produce 10 minutes simple-mod complex conversation with WNL volume given rare nonverbal cues over three sessions Baseline:  Goal status: INITIAL  3.  Pt's PROM will show improvement from initial administration Baseline:  Goal status: INITIAL  4.  Pt will report utilizing memory strategies to improve overall memory, in or between 3 sessions  Baseline:  Goal status: INITIAL  ASSESSMENT:  CLINICAL IMPRESSION: Patient is a 72 y.o. M who was seen today for treatment of speech skills in light of his Parkinson disease diagnosed approx 8 years ago. Pt progress in ST continues highly dependent upon cognitive load necessary to participate in dyad conversation/responses, and could be further complicated by pt's consistency with homework. See "today's treatment" for details. SLP noted pt has neurocognitive eval on 07-07-23 so no formal cognitive communication eval in ST will occur at this time.   OBJECTIVE IMPAIRMENTS: Objective impairments include attention, memory, dysarthria, and dysphagia. These impairments are limiting patient from managing medications, managing appointments, managing finances, household responsibilities, ADLs/IADLs,  effectively communicating at home and in community, and safety when swallowing.Factors affecting potential to achieve goals and functional outcome are ability to learn/carryover information, cooperation/participation level, and previous level of function.. Patient will benefit from skilled SLP services to address above impairments and improve overall function.  REHAB POTENTIAL: Good  PLAN:  SLP FREQUENCY: 2x/week  SLP DURATION: 8 weeks  PLANNED INTERVENTIONS: Aspiration precaution training, Pharyngeal strengthening exercises, Diet toleration management , Environmental controls, Trials of upgraded texture/liquids, Cueing hierachy, Cognitive reorganization, Internal/external aids, Functional tasks, SLP instruction and feedback, Compensatory strategies, Patient/family education, and MBS (possible)    Deanna Boehlke, CCC-SLP 06/27/2023, 10:24 AM

## 2023-06-29 ENCOUNTER — Ambulatory Visit: Payer: Medicare HMO

## 2023-06-29 DIAGNOSIS — R1310 Dysphagia, unspecified: Secondary | ICD-10-CM

## 2023-06-29 DIAGNOSIS — R41841 Cognitive communication deficit: Secondary | ICD-10-CM

## 2023-06-29 DIAGNOSIS — R471 Dysarthria and anarthria: Secondary | ICD-10-CM

## 2023-06-29 NOTE — Patient Instructions (Signed)
    App called [employer] - for thinking skills  Entire app is free for 14 days - then they ask for $29.99/month subscription [id number] for 15% off a subscription

## 2023-06-29 NOTE — Therapy (Signed)
OUTPATIENT SPEECH LANGUAGE PATHOLOGY TREATMENT   Patient Name: Jeremy Davidson MRN: [id number] DOB:Sep 15, 1951, 72 y.o., male Today's Date: 06/29/2023  PCP: Darrow Bussing, MD REFERRING PROVIDER: Darrow Bussing, MD   END OF SESSION:  End of Session - 06/29/23 1235     Visit Number 6    Number of Visits 17    Date for SLP Re-Evaluation 08/12/23    SLP Start Time 1104    SLP Stop Time  1150    SLP Time Calculation (min) 46 min    Activity Tolerance Patient tolerated treatment well                Past Medical History:  Diagnosis Date   Blood in stool 2010   Colonoscopy benign repeat 2015   Elevated cholesterol    Hypertension    Parkinson's disease 03/17/2017   Past Surgical History:  Procedure Laterality Date   HERNIA REPAIR  06/23/2010   With large ultra pro hrnia system-- Dr Dwain Sarna 1971. x2   Patient Active Problem List   Diagnosis Date Noted   Viral URI with cough 05/23/2022   Acute metabolic encephalopathy 05/23/2022   Difficulty walking 05/23/2022   Hyponatremia 05/23/2022   Parkinson's disease 03/17/2017   Essential hypertension 09/09/2015   Hyperlipidemia 09/09/2015    ONSET DATE: Approx 8 years ago, script dated 05-24-23  REFERRING DIAG: PD with dyskinesia  THERAPY DIAG:  Dysarthria and anarthria  Cognitive communication deficit  Dysphagia, unspecified type  Rationale for Evaluation and Treatment: Rehabilitation  SUBJECTIVE:   SUBJECTIVE STATEMENT: "Trying to stress, after reading, maintaining that volume and strength in my stomach."  Pt accompanied by: self   PERTINENT HISTORY: From Dr. Don Perking note 03/08/23: [name] was seen today in follow up for Parkinsons disease.  My previous records were reviewed prior to todays visit as well as outside records available to me.  Wife supplements history.  We slightly increased his levodopa last visit.  He tolerated that well, without side effects.  Last visit, he was having a lot of back  pain and referred him to physical therapy.  It looks like he only went twice and never went back.  We also referred him for speech therapy and he went 3 times and did not want to go back.  He states he wasn't sure it helped but wife does think it does and she would like another referral.  No lightheadedness/near syncope.  As far as he knows, BP is running good at home but he's not checking it.    PAIN:  Are you having pain? No  FALLS: Has patient fallen in last 6 months?  Yes, Number of falls: 1 "full fall", and two where pt caught himself before going to the floor  PATIENT GOALS: Improve communication effectiveness  OBJECTIVE:   COGNITION: Overall cognitive status: Impaired Areas of impairment: Attention and Memory Comments: Pt provides example of "completely gone" feeling (losing his desired message) when in conversation.  Pt does not report difficulty with swallowing but pt's swallowing may warrant further evaluation, due to pt's awareness may be decr'd re: swallowing deficits, so SLP will monitor pt's swallowing during sessions . A bedside swallow eval may be initiated and a MBS may be warranted in this therapy course.  PATIENT REPORTED OUTCOME MEASURES (PROM): Communication Effectiveness Survey: to be administered today and pt answered stimuli to score 21/32 (higher scores=more effective communication).  TODAY'S TREATMENT:  DATE:  06/29/23: Pt educated pt on [employer] and worked with him in downloading and setting up an account. SLP assisted pt occasionally at attention to detail (pushing "next", or looking at all options on the screen prior to hitting something incorrectly while setting up account). In the cognitive tasks, pt req'd mod A consistently for alternating attention. SLP to focus on speech intelligibility as pt will receive neuropsych  eval 07-07-23.  06/27/23: SLP acknowledged pt's wife should attend therapy with pt whenever possible. SLP  asked about pt's swallowing status again (first time asked was at eval), and pt mistakenly answered about his SPEAKING; SLP redirected Turney to answer SLP's question about swallowing and he denied noticing throat clearing and/or coughing with POs. SLP to cont to monitor. Pt states he is performing loud /a/ and practicing louder speech at home with responses worksheets "not as much as (is prescribed) but I'm doing some." SLP strongly reiterated the need for consistent practice to enhance positive change in speech loudness.  SLP assisted pt with loud /a/, providing consistent mod cues for full breath and for loudness throughout the 10 reps. SLP could not fade cues, as pt req'd mod cues the entire task. SLP then had pt read 50 common conversational sentences. At first pt volume was in mid to upper 60s dB, and he req'd occasional min-mod A for loudness. On the third time through, pt became more consistent with loudness in mid 70s dB. SLP again strongly reiterated consistency with practicing louder speech with responses on worksheets for 20 minutes BID.   06/23/23: SLP reiterated the need for daily practice and provided rationale for this. Daughter tells SLP pt does not exhibit any difficulty with swallowing sx during meals, but wonders (from pt's wife) if there's anything that can be done to prevent swallowing issues. SLP suggested 10 effortful swallows BID. Pt tells SLP that he is using more abdominal musculature recruitment than before ST was initiated, and thus "S" statement. SLP shared about communication at drive thrus and communicating with wait staff are also good barometers for assessing if pt is speaking louder.  Today, pt's /a/ averaged 84dB, with occasional min A for loudness. Simple word level responses were  highly dependent upon cognitive load (naming one memober from a simple/common category)  Occasional increased to usual min cues needed due to mental fatigue - average mid -upper 60s dB. Answering simple questions re: seasons, months, holidays with one word answers req'd occasional mod cues -average was mid 60s dB. SLP provided homework and daughter stated she could assist pt as necessary with reading and answering.   06/20/23: SLP began by explaining rationale for loud /a/ for daughter, and explaining rationale for daily practice sessions at home. Suggested pt do 20-25 minutes BID, separate from loud /a/. During session SLP also explained that due to pt's cognition he may have more difficulty with mod complex conversation and staying at WNL volume.  In divergent naming task (concrete categories) pt req'd usual mod cues for loudness faded to occasional min-mod cues. By session end, pt began indicating when he did not maintain loudness throughout the utterance. SLP pointed this out to pt and daughter and used it as reinforcement for daily practice with /a/ and 20-25 minutes BID. Daughter typed during session as SLP explained things to pt (re-explained) and daughter.  06/13/23: Pt demo'd sx of reduced working memory as he req'd cues spelling 3-4 letter words backwards today. Pt may benefit from formal comm-cog evaluation.  Generated /a/ with average  mid 80s dB with occasional mod A for loudness/strength. In phrase responses (pt unable to adequately read information due to cataracts (sx upcoming) pt req'd usual min-mod cues faded to occasional min-mod cues for strength and loudness. Pt faded loudness at end of phrases 50% of the time.  SLP administered PROM today with results above.   06/07/23: SLP shaped pt's /a/ today and explained rationale for loud /a/ (explained to pt as "strong /a/" to minimize vocal strain and laryngeal effort). SLP explained treatment options and pt chose a less-programmed option (a plan that would NOT include SpeakOut!).   PATIENT EDUCATION: Education details: see  "today's treatment" Person educated: Patient and daughter Education method: Explanation, Demonstration, Verbal cues, and Handouts Education comprehension: verbalized understanding, returned demonstration, verbal cues required, and needs further education  HOME EXERCISE PROGRAM: 10 strong "ah" BID. Eventually, pt will complete glides and everyday sentences, when and if clinically appropriate.   GOALS: Goals reviewed with patient? No  SHORT TERM GOALS: Target date: 07/13/23  pt will produce loud /a/ or "hey!" with at least upper 80s dB average over three sessions Baseline: Goal status: INITIAL  2.  Pt will produce 16/20 sentence responses with WNL volume over two sessions Baseline:  Goal status: INITIAL  3.  Pt will produce 3 minutes simple conversation with WNL volume over three sessions Baseline:  Goal status: INITIAL  4.  Pt will tell SLP benefit of brain-stimulating tasks (eg, "constant therapy", "lumosity", sudoku, etc) Baseline:  Goal status: INITIAL  5.  Pt will undergo bedside swallow assessment if clinically indicated a Baseline:  Goal status: INITIAL   LONG TERM GOALS: Target date: 08/12/23  Pt will undergo MBS if clinically indicated Baseline:  Goal status: INITIAL  2.  Pt will produce 10 minutes simple-mod complex conversation with WNL volume given rare nonverbal cues over three sessions Baseline:  Goal status: INITIAL  3.  Pt's PROM will show improvement from initial administration Baseline:  Goal status: INITIAL  4.  Pt will report utilizing memory strategies to improve overall memory, in or between 3 sessions  Baseline:  Goal status: INITIAL  ASSESSMENT:  CLINICAL IMPRESSION: Patient is a 72 y.o. M who was seen today for treatment of speech skills in light of his Parkinson disease diagnosed approx 8 years ago. Pt progress in ST continues highly dependent upon cognitive load necessary to participate in dyad conversation/responses, and could be  further complicated by pt's consistency with homework. See "today's treatment" for details. SLP noted pt has neurocognitive eval on 07-07-23 so no formal cognitive communication eval in ST will occur at this time.   OBJECTIVE IMPAIRMENTS: Objective impairments include attention, memory, dysarthria, and dysphagia. These impairments are limiting patient from managing medications, managing appointments, managing finances, household responsibilities, ADLs/IADLs, effectively communicating at home and in community, and safety when swallowing.Factors affecting potential to achieve goals and functional outcome are ability to learn/carryover information, cooperation/participation level, and previous level of function.. Patient will benefit from skilled SLP services to address above impairments and improve overall function.  REHAB POTENTIAL: Good  PLAN:  SLP FREQUENCY: 2x/week  SLP DURATION: 8 weeks  PLANNED INTERVENTIONS: Aspiration precaution training, Pharyngeal strengthening exercises, Diet toleration management , Environmental controls, Trials of upgraded texture/liquids, Cueing hierachy, Cognitive reorganization, Internal/external aids, Functional tasks, SLP instruction and feedback, Compensatory strategies, Patient/family education, and MBS (possible)    Joselin Crandell, CCC-SLP 06/29/2023, 12:35 PM

## 2023-07-03 ENCOUNTER — Ambulatory Visit: Payer: Medicare HMO

## 2023-07-03 DIAGNOSIS — R41841 Cognitive communication deficit: Secondary | ICD-10-CM

## 2023-07-03 DIAGNOSIS — R471 Dysarthria and anarthria: Secondary | ICD-10-CM | POA: Diagnosis not present

## 2023-07-03 DIAGNOSIS — R1310 Dysphagia, unspecified: Secondary | ICD-10-CM | POA: Diagnosis not present

## 2023-07-03 NOTE — Therapy (Signed)
OUTPATIENT SPEECH LANGUAGE PATHOLOGY TREATMENT   Patient Name: Jeremy Davidson MRN: [id number] DOB:1951-08-27, 72 y.o., male Today's Date: 07/03/2023  PCP: Jeremy Bussing, MD REFERRING PROVIDER: Darrow Bussing, MD   END OF SESSION:  End of Session - 07/03/23 1105     Visit Number 7    Number of Visits 17    Date for SLP Re-Evaluation 08/12/23    SLP Start Time 1103    SLP Stop Time  1145    SLP Time Calculation (min) 42 min    Activity Tolerance Patient tolerated treatment well                Past Medical History:  Diagnosis Date   Blood in stool 2010   Colonoscopy benign repeat 2015   Elevated cholesterol    Hypertension    Parkinson's disease 03/17/2017   Past Surgical History:  Procedure Laterality Date   HERNIA REPAIR  06/23/2010   With large ultra pro hrnia system-- Dr Jeremy Davidson 1971. x2   Patient Active Problem List   Diagnosis Date Noted   Viral URI with cough 05/23/2022   Acute metabolic encephalopathy 05/23/2022   Difficulty walking 05/23/2022   Hyponatremia 05/23/2022   Parkinson's disease 03/17/2017   Essential hypertension 09/09/2015   Hyperlipidemia 09/09/2015    ONSET DATE: Approx 8 years ago, script dated 05-24-23  REFERRING DIAG: PD with dyskinesia  THERAPY DIAG:  Dysarthria and anarthria  Cognitive communication deficit  Rationale for Evaluation and Treatment: Rehabilitation  SUBJECTIVE:   SUBJECTIVE STATEMENT: "His voice is getting softer, but I wanted to ask - you did something with his phone last time and he can't get into it now."  Pt accompanied by: significant other Jeremy Davidson   PERTINENT HISTORY: From Dr. Don Davidson note 03/08/23: Jeremy Davidson was seen today in follow up for Parkinsons disease.  My previous records were reviewed prior to todays visit as well as outside records available to me.  Wife supplements history.  We slightly increased his levodopa last visit.  He tolerated that well, without side effects.  Last visit,  he was having a lot of back pain and referred him to physical therapy.  It looks like he only went twice and never went back.  We also referred him for speech therapy and he went 3 times and did not want to go back.  He states he wasn't sure it helped but wife does think it does and she would like another referral.  No lightheadedness/near syncope.  As far as he knows, BP is running good at home but he's not checking it.    PAIN:  Are you having pain? No  FALLS: Has patient fallen in last 6 months?  Yes, Number of falls: 1 "full fall", and two where pt caught himself before going to the floor  PATIENT GOALS: Improve communication effectiveness  OBJECTIVE:   COGNITION: Overall cognitive status: Impaired Areas of impairment: Attention and Memory Comments: Pt provides example of "completely gone" feeling (losing his desired message) when in conversation.  Pt does not report difficulty with swallowing but pt's swallowing may warrant further evaluation, due to pt's awareness may be decr'd re: swallowing deficits, so SLP will monitor pt's swallowing during sessions . A bedside swallow eval may be initiated and a MBS may be warranted in this therapy course.  PATIENT REPORTED OUTCOME MEASURES (PROM): Communication Effectiveness Survey: to be administered today and pt answered stimuli to score 21/32 (higher scores=more effective communication).  TODAY'S TREATMENT:  DATE:  07/03/23: Wife states Jeremy Davidson's speech has been louder in the last 3 weeks, "I'm less annoying now than I used to be (asking him to repeat)." Jeremy Davidson) Throughout session SLP explained/educated wife on difficulties particularly with speech volume and spoken language generation, and also explained some compensations she may need to employ due to this. Today pt's /a/ req'd initial cues for extending  production (pt started with 2-3 second productions). Jeremy Davidson stated pt /a/ productions are short like this at home sometimes. Averaged 90 dB with occasional cues for open mouth. SLP worked with pt with phrase and short sentence responses with occasional mod A for improving volume. Occasionally SLP would have to repeat stimulus due to incorrect response, SLP asked for another response, and pt would thus forget stimulus. SLP worked with pt and wife to get pt signed back in to [facility]. SLP suggested since pt has cataract sx tomorrow that he wait until his eyesight is recovered and then call company and request 14 day trial restart. He and wife agreeable with this.   06/29/23: Pt educated pt on [facility] and worked with him in downloading and setting up an account. SLP assisted pt occasionally at attention to detail (pushing "next", or looking at all options on the screen prior to hitting something incorrectly while setting up account). In the cognitive tasks, pt req'd mod A consistently for alternating attention. SLP to focus on speech intelligibility as pt will receive neuropsych eval 07-07-23.  06/27/23: SLP acknowledged pt's wife should attend therapy with pt whenever possible. SLP  asked about pt's swallowing status again (first time asked was at eval), and pt mistakenly answered about his SPEAKING; SLP redirected Jeremy Davidson to answer SLP's question about swallowing and he denied noticing throat clearing and/or coughing with POs. SLP to cont to monitor. Pt states he is performing loud /a/ and practicing louder speech at home with responses worksheets "not as much as (is prescribed) but I'm doing some." SLP strongly reiterated the need for consistent practice to enhance positive change in speech loudness.  SLP assisted pt with loud /a/, providing consistent mod cues for full breath and for loudness throughout the 10 reps. SLP could not fade cues, as pt req'd mod cues the entire task. SLP then had pt  read 50 common conversational sentences. At first pt volume was in mid to upper 60s dB, and he req'd occasional min-mod A for loudness. On the third time through, pt became more consistent with loudness in mid 70s dB. SLP again strongly reiterated consistency with practicing louder speech with responses on worksheets for 20 minutes BID.   06/23/23: SLP reiterated the need for daily practice and provided rationale for this. Daughter tells SLP pt does not exhibit any difficulty with swallowing sx during meals, but wonders (from pt's wife) if there's anything that can be done to prevent swallowing issues. SLP suggested 10 effortful swallows BID. Pt tells SLP that he is using more abdominal musculature recruitment than before ST was initiated, and thus "S" statement. SLP shared about communication at drive [employer] and communicating with wait staff are also good barometers for assessing if pt is speaking louder.  Today, pt's /a/ averaged 84dB, with occasional min A for loudness. Simple word level responses were  highly dependent upon cognitive load (naming one memober from a simple/common category) Occasional increased to usual min cues needed due to mental fatigue - average mid -upper 60s dB. Answering simple questions re: seasons, months, holidays with one word answers req'd occasional  mod cues -average was mid 60s dB. SLP provided homework and daughter stated she could assist pt as necessary with reading and answering.   06/20/23: SLP began by explaining rationale for loud /a/ for daughter, and explaining rationale for daily practice sessions at home. Suggested pt do 20-25 minutes BID, separate from loud /a/. During session SLP also explained that due to pt's cognition he may have more difficulty with mod complex conversation and staying at WNL volume.  In divergent naming task (concrete categories) pt req'd usual mod cues for loudness faded to occasional min-mod cues. By session end, pt began indicating when he  did not maintain loudness throughout the utterance. SLP pointed this out to pt and daughter and used it as reinforcement for daily practice with /a/ and 20-25 minutes BID. Daughter typed during session as SLP explained things to pt (re-explained) and daughter.  06/13/23: Pt demo'd sx of reduced working memory as he req'd cues spelling 3-4 letter words backwards today. Pt may benefit from formal comm-cog evaluation.  Generated /a/ with average mid 80s dB with occasional mod A for loudness/strength. In phrase responses (pt unable to adequately read information due to cataracts (sx upcoming) pt req'd usual min-mod cues faded to occasional min-mod cues for strength and loudness. Pt faded loudness at end of phrases 50% of the time.  SLP administered PROM today with results above.   06/07/23: SLP shaped pt's /a/ today and explained rationale for loud /a/ (explained to pt as "strong /a/" to minimize vocal strain and laryngeal effort). SLP explained treatment options and pt chose a less-programmed option (a plan that would NOT include [employer]!).   PATIENT EDUCATION: Education details: see "today's treatment" Person educated: Patient and daughter Education method: Explanation, Demonstration, Verbal cues, and Handouts Education comprehension: verbalized understanding, returned demonstration, verbal cues required, and needs further education  HOME EXERCISE PROGRAM: 10 strong "ah" BID. Eventually, pt will complete glides and everyday sentences, when and if clinically appropriate.   GOALS: Goals reviewed with patient? No  SHORT TERM GOALS: Target date: 07/13/23  pt will produce loud /a/ or "hey!" with at least upper 80s dB average over three sessions Baseline: 07/03/23 Goal status: INITIAL  2.  Pt will produce 16/20 sentence responses with WNL volume over two sessions Baseline:  Goal status: INITIAL  3.  Pt will produce 3 minutes simple conversation with WNL volume over three sessions Baseline:   Goal status: INITIAL  4.  Pt will tell SLP benefit of brain-stimulating tasks (eg, "[facility]", "lumosity", sudoku, etc) Baseline:  Goal status: INITIAL  5.  Pt will undergo bedside swallow assessment if clinically indicated Baseline:  Goal status: INITIAL   LONG TERM GOALS: Target date: 08/12/23  Pt will undergo MBS if clinically indicated Baseline:  Goal status: INITIAL  2.  Pt will produce 10 minutes simple-mod complex conversation with WNL volume given rare nonverbal cues over three sessions Baseline:  Goal status: INITIAL  3.  Pt's PROM will show improvement from initial administration Baseline:  Goal status: INITIAL  4.  Pt will report utilizing memory strategies to improve overall memory, in or between 3 sessions  Baseline:  Goal status: INITIAL  ASSESSMENT:  CLINICAL IMPRESSION: Patient is a 72 y.o. M who was seen today for treatment of speech skills in light of his Parkinson disease diagnosed approx 8 years ago. Pt progress in ST continues highly dependent upon cognitive load necessary to participate in dyad conversation/responses, and could be further complicated by pt's consistency with homework. See "  today's treatment" for details. SLP noted pt has neurocognitive eval on 07-07-23 so no formal cognitive communication eval in ST will occur at this time.   OBJECTIVE IMPAIRMENTS: Objective impairments include attention, memory, dysarthria, and dysphagia. These impairments are limiting patient from managing medications, managing appointments, managing finances, household responsibilities, ADLs/IADLs, effectively communicating at home and in community, and safety when swallowing.Factors affecting potential to achieve goals and functional outcome are ability to learn/carryover information, cooperation/participation level, and previous level of function.. Patient will benefit from skilled SLP services to address above impairments and improve overall function.  REHAB  POTENTIAL: Good  PLAN:  SLP FREQUENCY: 2x/week  SLP DURATION: 8 weeks  PLANNED INTERVENTIONS: Aspiration precaution training, Pharyngeal strengthening exercises, Diet toleration management , Environmental controls, Trials of upgraded texture/liquids, Cueing hierachy, Cognitive reorganization, Internal/external aids, Functional tasks, SLP instruction and feedback, Compensatory strategies, Patient/family education, and MBS (possible)    Fedra Lanter, CCC-SLP 07/03/2023, 11:05 AM

## 2023-07-06 ENCOUNTER — Ambulatory Visit: Payer: Medicare HMO

## 2023-07-06 DIAGNOSIS — R69 Illness, unspecified: Secondary | ICD-10-CM | POA: Diagnosis not present

## 2023-07-07 ENCOUNTER — Encounter: Payer: Self-pay | Admitting: Psychology

## 2023-07-07 ENCOUNTER — Ambulatory Visit: Payer: Medicare HMO

## 2023-07-07 ENCOUNTER — Ambulatory Visit: Payer: Medicare HMO | Admitting: Psychology

## 2023-07-07 DIAGNOSIS — F02A0 Dementia in other diseases classified elsewhere, mild, without behavioral disturbance, psychotic disturbance, mood disturbance, and anxiety: Secondary | ICD-10-CM

## 2023-07-07 DIAGNOSIS — R4189 Other symptoms and signs involving cognitive functions and awareness: Secondary | ICD-10-CM

## 2023-07-07 DIAGNOSIS — K224 Dyskinesia of esophagus: Secondary | ICD-10-CM | POA: Insufficient documentation

## 2023-07-07 DIAGNOSIS — G20A1 Parkinson's disease without dyskinesia, without mention of fluctuations: Secondary | ICD-10-CM

## 2023-07-07 DIAGNOSIS — F0280 Dementia in other diseases classified elsewhere without behavioral disturbance: Secondary | ICD-10-CM | POA: Insufficient documentation

## 2023-07-07 DIAGNOSIS — I700 Atherosclerosis of aorta: Secondary | ICD-10-CM | POA: Insufficient documentation

## 2023-07-07 HISTORY — DX: Parkinson's disease without dyskinesia, without mention of fluctuations: G20.A1

## 2023-07-07 NOTE — Progress Notes (Signed)
NEUROPSYCHOLOGICAL EVALUATION [place] Department of Neurology  Date of Evaluation: July 07, 2023  Reason for Referral:   Jeremy Davidson is a 72 y.o. right-handed Caucasian male referred by Kerin Salen, D.O., to characterize his current cognitive functioning and assist with diagnostic clarity and treatment planning in the context of Parkinson's disease and concern for progressive cognitive decline.   Assessment and Plan:   Clinical Impression(s): Jeremy Davidson pattern of performance is suggestive of fairly diffuse cognitive impairment. His greatest areas of impairment surrounded processing speed, attention/concentration, and all aspects of learning and memory. Further weakness/variability was exhibited across executive functioning, especially cognitive flexibility and response inhibition, as well as verbal fluency. Performances were appropriate relative to age-matched peers across abstract reasoning, safety/judgment, confrontation naming, and visuospatial abilities. Functionally, his wife has fully taken over medication management, financial management, and bill paying responsibilities. He also recently decided to stop driving of his own volition due to perceived deficits surrounding reaction time and other cognitive abilities. Given evidence for cognitive impairment and the likelihood that it is directly interfering with day-to-day functioning, I believe that Jeremy Davidson best meets diagnostic criteria for a Major Neurocognitive Disorder ("dementia") at the present time.  The most likely etiology for his fairly mild dementia presentation would appear to be his history of Parkinson's disease. His patterns of impairment across testing align well with what is expected with this sort of illness. There remains the potential that his bout of metabolic encephalopathy caused by an acute upper respiratory illness in August 2023 could be influencing his overall  degree of functioning. However, presentations like this typically improve over time and do not exhibit progressive decline as described by both Jeremy Davidson and his wife. Despite concern surrounding visual hallucinations, current testing patterns are not particularly compelling with regard to Lewy body disease given relatively intact visuospatial abilities. Hallucinations may be better described as illusions. He was fairly vague when describing overall alcohol consumption and this could also be playing a relatively mild role in ongoing dysfunction. There is no recent neuroimaging as his most recent brain MRI appears to have been completed in 2018. As such, I cannot theorize about other potential anatomical contributions to ongoing dysfunction. Continued medical monitoring will be important moving forward.   Recommendations: He is encouraged to continue care and management of Parkinson's disease with Dr. Arbutus Leas as this condition appears well managed presently. I would also encourage continued involvement in outpatient speech therapy and PT.   A brain MRI could be considered, primarily to better understand cerebrovascular contributions to his current clinical presentation, as well as to rule out any unexpected anatomical abnormalities which could be influencing his cognitive abilities.   The CDC defines "heavy drinking" for men as consuming 15 or more alcoholic beverages per week. Jeremy Davidson reported consuming at least one beer daily, with instances throughout the week where he may consume 2-3 beers daily. Chronic heavy drinking with have a negative impact on both his physical health, as well as his neurological health. Should his true alcohol consumption rate be near 15 beverages per week, he is encouraged to diminish ongoing intake.   Performance across neurocognitive testing is not a strong predictor of an individual's safety operating a motor vehicle. Should his Davidson wish to pursue a formalized driving  evaluation, they could reach out to the following agencies: [facility] [employer] [facility] in [place]: [phone] [facility]: [phone] [facility]: [phone] [facility]: [phone] or [phone]  It will be  important for Jeremy Davidson to have another person with him when in situations where he may need to process information, weigh the pros and cons of different options, and make decisions, in order to ensure that he fully understands and recalls all information to be considered.  If not already done, Jeremy Davidson may want to discuss his wishes regarding durable power of attorney and medical decision making, so that he can have input into these choices. If they require legal assistance with this, long-term care resource access, or other aspects of estate planning, they could reach out to [employer] at [phone] for a free consultation. Additionally, they may wish to discuss future plans for caretaking and seek out community options for in home/residential care should they become necessary.  Jeremy Davidson is encouraged to attend to lifestyle factors for brain health (e.g., regular physical exercise, good nutrition habits and consideration of the MIND-DASH diet, regular participation in cognitively-stimulating activities, and general stress management techniques), which are likely to have benefits for both emotional adjustment and cognition. Optimal control of vascular risk factors (including safe cardiovascular exercise and adherence to dietary recommendations) is encouraged. Continued participation in activities which provide mental stimulation and social interaction is also recommended.   Important information should be provided to Jeremy Davidson in written format in all instances. This information should be placed in a highly frequented and easily visible location within his home to promote recall. External strategies such as written  notes in a consistently used memory journal, visual and nonverbal auditory cues such as a calendar on the refrigerator or appointments with alarm, such as on a cell phone, can also help maximize recall.  When learning new information, he would benefit from information being broken up into small, manageable pieces. He may also find it helpful to articulate the material in his own words and in a context to promote encoding at the onset of a new task. This material may need to be repeated multiple times to promote encoding.  To address problems with processing speed, he may wish to consider:   -Ensuring that he is alerted when essential material or instructions are being presented   -Adjusting the speed at which new information is presented   -Allowing for more time in comprehending, processing, and responding in conversation   -Repeating and paraphrasing instructions or conversations aloud  To address problems with fluctuating attention and/or executive dysfunction, he may wish to consider:   -Avoiding external distractions when needing to concentrate   -Limiting exposure to fast paced environments with multiple sensory demands   -Writing down complicated information and using checklists   -Attempting and completing one task at a time (i.e., no multi-tasking)   -Verbalizing aloud each step of a task to maintain focus   -Taking frequent breaks during the completion of steps/tasks to avoid fatigue   -Reducing the amount of information considered at one time   -Scheduling more difficult activities for a time of day where he is usually most alert  Review of Records:   Jeremy Davidson was seen by [facility] Lurena Joiner Tat, D.O.) on 11/25/2019 for ongoing management of Parkinson's disease. Jeremy Davidson was diagnosed in June 2018 following onset of a left-sided tremor. Additional symptoms surrounded a shuffling, bradykinetic gait and some hypophonia. There was also report of potential visual  hallucinations versus visual illusions. He reported seeing a child inside the home. Vivid dreaming was also reported with some occasional screaming. No physical manifestations were described.   Mr.  Jeremy Davidson has continued to follow with Dr. Arbutus Leas since his initial February 2021 evaluation, most recently meeting with her on 03/08/2023. Medications have been adjusted over time and he has been referred to PT/ST in the past with varying benefit. Jeremy Davidson and his wife have expressed concerns surrounding memory decline and other cognitive dysfunction over time. Ultimately, Jeremy Davidson was referred for a comprehensive neuropsychological evaluation to characterize his cognitive abilities and to assist with diagnostic clarity and treatment planning.   Neuroimaging Brain MRI on 10/21/2011 revealed mild microvascular ischemic disease. Brain MRI on 03/30/2017 was stable. Head CT on 05/22/2022 was negative.  Past Medical History:  Diagnosis Date   Acute metabolic encephalopathy 05/23/2022   viral URI   Blood in stool 2010   Colonoscopy benign repeat 2015   Difficulty walking 05/23/2022   Esophageal dysmotility    Essential hypertension 09/09/2015   Hardening of the aorta (main artery of the heart)    Hyperlipidemia 09/09/2015   Hyponatremia 05/23/2022   Parkinson's disease 03/17/2017   Viral URI with cough 05/23/2022    Past Surgical History:  Procedure Laterality Date   HERNIA REPAIR  06/23/2010   With large ultra pro hrnia system-- Dr Dwain Sarna 1971. x2    Current Outpatient Medications:    Ascorbic Acid (VITAMIN C) 1000 MG tablet, Take 1,000 mg by mouth daily., Disp: , Rfl:    atorvastatin (LIPITOR) 40 MG tablet, Take 40 mg by mouth at bedtime., Disp: , Rfl:    carbidopa-levodopa (SINEMET CR) 50-200 MG tablet, TAKE 1 TABLET BY MOUTH EVERYDAY AT BEDTIME, Disp: 90 tablet, Rfl: 0   carbidopa-levodopa (SINEMET IR) 25-100 MG tablet, TAKE 2 TABLETS AT 6AM AND 1 TABLET AT 10AM/2PM/6PM, Disp: 450 tablet,  Rfl: 2   docusate sodium (STOOL SOFTENER) 100 MG capsule, Take 100 mg by mouth 2 (two) times daily., Disp: , Rfl:    escitalopram (LEXAPRO) 10 MG tablet, TAKE 1 TABLET BY MOUTH EVERY DAY, Disp: 90 tablet, Rfl: 0   Multiple Vitamin (MULTIVITAMIN) tablet, Take 1 tablet by mouth daily. Men's health formula multi-mineral 50+, Disp: , Rfl:    phenol (CHLORASEPTIC) 1.4 % LIQD, Use as directed 1 spray in the mouth or throat as needed for throat irritation / pain., Disp: 20 mL, Rfl: 0   tamsulosin (FLOMAX) 0.4 MG CAPS capsule, Take 0.4 mg by mouth daily., Disp: , Rfl:   Clinical Interview:   The following information was obtained during a clinical interview with Jeremy Davidson and his wife prior to cognitive testing.  Cognitive Symptoms: Decreased short-term memory: Endorsed. He described his memory as "not very good." Specific examples surrounded trouble recalling details of recent conversations and increased repetition to day-to-day conversation. He reported memory decline over the past several years which has seemed to progressively worsen over time. His wife was in agreement.  Decreased long-term memory: Denied. Decreased attention/concentration: Endorsed. He reported trouble with sustained focus, increased distractibility, and instances where he will lose his train of thought.  Reduced processing speed: Endorsed. Difficulties with executive functions: Endorsed. He reported trouble with organization and multi-tasking primarily. He and his wife denied trouble with impulsivity or any prominent personality changes.  Difficulties with emotion regulation: Denied. Difficulties with receptive language: Denied. Difficulties with word finding: Endorsed. Decreased visuoperceptual ability: Denied.  Difficulties completing ADLs: Endorsed. His wife has relatively recently taken over medication management, financial management, and bill paying responsibilities. While she did not elaborate, she did endorse that  this was due to Jeremy Davidson experiencing difficulties performing these  actions. He stopped driving about 1-2 weeks prior to the current evaluation of his own volition. This was reportedly due to subjective concerns surrounding reaction time and driving safety. He denied any prior instances of getting lost or any recent accidents.   Additional Medical History: History of traumatic brain injury/concussion: Denied. History of stroke: Denied. History of seizure activity: Denied. History of known exposure to toxins: Denied. Symptoms of chronic pain: Denied. Experience of frequent headaches/migraines: Denied. Frequent instances of dizziness/vertigo: Endorsed. He reported dizzy spells occurring "all the time" and "pretty much everyday." Symptoms were said to occur when standing quickly, when quickly changing positions, or sometimes at seemingly random points of time without a clear cause.   Sensory changes: He utilizes glasses with benefit. Other sensory changes/difficulties were not reported.  Balance/coordination difficulties: He described his balance as "not as good as it used to be." He noted some greater left sided weakness in general. Instability was attributed to Parkinson's disease. He is physically active and involved in various exercise pursuits. He reported a fall about 1-2 months prior, caused by him tripping while attempting to go up a flight of stairs. Outside of this, no frequent falling behaviors were reported.  Other motor difficulties: Tremors are present in his left leg and upper extremities bilaterally (L > R). Symptoms have been somewhat improved with ongoing medication intervention.   Sleep History: Estimated hours obtained each night: Unclear. He described his sleep as very broken, with him rarely getting more than 2-3 hours of sleep before being awoken. This was noted to be fairly longstanding in nature, even pre-dating his Parkinson's disease diagnosis.  Difficulties falling  asleep: Denied. Difficulties staying asleep: Endorsed. He reported frequently waking, primarily to use the restroom. He is generally able to fall asleep quickly after he returns to bed.  Feels rested and refreshed upon awakening: "I don't know." He did report fatiguing relatively quickly as the day progresses.   History of snoring: Denied. History of waking up gasping for air: Denied. Witnessed breath cessation while asleep: Denied.  History of vivid dreaming: Endorsed. Excessive movement while asleep: Denied. Instances of acting out his dreams: Denied.  Psychiatric/Behavioral Health History: Depression: He described his current mood as "eh" and "could be better." He denied to his knowledge any prior mental health concerns or formal diagnoses. Current symptoms were directly related to Parkinson's disease and his observation of gradual decline over time. Current or remote suicidal ideation, intent, or plan was denied.  Anxiety: Denied. Mania: Denied. Trauma History: Denied. Visual/auditory hallucinations: Historically, there have been concerns surrounding visual hallucinations versus visual illusions. Lately, he reported that hallucinations/illusions "come and go." He described seeing children at times, nondescript figures in the same room as him, as well as an instance of seeing a fire hydrant. Symptoms do seem less prevalent lately, likely related to medication adjustments. Experiences were not said to cause any degree of distress.  Delusional thoughts: Denied.  Tobacco: Denied. Alcohol: He reported consuming at least one beer daily. This will sometimes increase to 2-3 beers in a particular day. He denied a history of problematic alcohol abuse or dependence.  Recreational drugs: Denied.  Davidson History: Problem Relation Age of Onset   Tremor Father        head   Parkinsonism Brother        Lewy body disease   This information was confirmed by Jeremy Davidson.  Academic/Vocational  History: Highest level of educational attainment: 16 years. 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 He described himself as an average (B/C) student in high school and exhibited improved academic performances while in college. Spelling was noted to be a likely relative weakness.  History of developmental delay: Denied. History of grade repetition: Denied. Enrollment in special education courses: Denied. History of LD/ADHD: Denied.  Employment: Retired. 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 After this, he worked in [employer].   Evaluation Results:   Behavioral Observations: Jeremy Davidson was accompanied by his wife, arrived to his appointment on time, and was appropriately dressed and groomed. He appeared alert and oriented. He ambulated somewhat slowly and exhibited minimal arm swing. Pronounced instability was not observed. Resting tremors were observed in his upper extremities bilaterally throughout the interview. His wife also highlighted some leg tremors. His affect was generally relaxed and positive. Spontaneous speech was fluent and word finding difficulties were not observed during the clinical interview. Thought processes were coherent, organized, and normal in content. Insight into his cognitive difficulties appeared fairly adequate. However, he may not fully appreciate the extent of ongoing impairment.   Given ongoing tremors, several cognitive tasks were substituted with non-motor analogs where possible. During testing, sustained attention was appropriate. Task engagement was adequate and he persisted when challenged. At the conclusion of the evaluation while filling out mood-related questionnaires, he commented that he was experiencing double vision due to the effects of cataracts which had yet to be removed. It was unclear how much this may  have impacted visual tasks across the evaluation as no prior comment was made by Jeremy Davidson to suggest dysfunction. Overall, Jeremy Davidson was cooperative with the clinical interview and subsequent testing procedures.   Adequacy of Effort: The validity of neuropsychological testing is limited by the extent to which the individual being tested may be assumed to have exerted adequate effort during testing. Jeremy Davidson expressed his intention to perform to the best of his abilities and exhibited adequate task engagement and persistence. Scores across stand-alone and embedded performance validity measures were below expectation. However, this is believed to be due to true cognitive cognitive impairment rather than poor engagement or attempts to perform poorly. As such, the results of the current evaluation are believed to be a valid representation of Jeremy Davidson's current cognitive functioning.  Test Results: Jeremy Davidson was mildly disoriented at the time of the current evaluation. He incorrectly stated the current year ("2004") and was unable to name the current clinic.  Intellectual abilities based upon educational and vocational attainment were estimated to be in the average range. Premorbid abilities were estimated to be within the below average range based upon a single-word reading test.   Processing speed was exceptionally low outside of a basic task where he had to count aloud from 1-25 as quickly as possible (which was average). Basic attention was well below average. More complex attention (e.g., working memory) was also well below average. Executive functioning was variable, ranging from the exceptionally low to average normative ranges. He performed in the average range across a task assessing safety and judgment.   While not directly assessed, receptive language abilities were believed to be adequate. Jeremy Davidson did not exhibit prominent difficulties comprehending task instructions and  answered all questions asked of him appropriately. Assessed expressive language was mildly variable. Phonemic fluency was below average, semantic fluency was well below average to below average, and confrontation naming was average.  Assessed visuospatial/visuoconstructional abilities were below average to average. Points were lost on his clock due to mild numerical spacing irregularities, as well as incorrect hand placement.     Learning (i.e., encoding) of novel verbal and visual information was exceptionally low to well below average. Spontaneous delayed recall (i.e., retrieval) of previously learned information was also exceptionally low to well below average. Retention rates were 63% across a story learning task, 0% across a list learning task, 0% across a daily living task, and 100% (raw score of 2) across a shape learning task. Performance across recognition tasks was exceptionally low to well below average, suggesting minimal evidence for information consolidation.   Results of emotional screening instruments suggested that recent symptoms of generalized anxiety were in the minimal range, while symptoms of depression were within the mild range. A screening instrument assessing recent sleep quality suggested the presence of minimal sleep dysfunction.  Tables of Scores:   Note: This summary of test scores accompanies the interpretive report and should not be considered in isolation without reference to the appropriate sections in the text. Descriptors are based on appropriate normative data and may be adjusted based on clinical judgment. Terms such as "Within Normal Limits" and "Outside Normal Limits" are used when a more specific description of the test score cannot be determined.       Percentile - Normative Descriptor > 98 - Exceptionally High 91-97 - Well Above Average 75-90 - Above Average 25-74 - Average 9-24 - Below Average 2-8 - Well Below Average < 2 - Exceptionally Low        Validity:   DESCRIPTOR       DCT: --- --- Outside Normal Limits  RBANS EI: --- --- Outside Normal Limits       Orientation:      Raw Score Percentile   NAB Orientation, Form 1 26/29 --- ---       Cognitive Screening:      Raw Score Percentile   SLUMS: 14/30 --- ---       Intellectual Functioning:      Standard Score Percentile   Test of Premorbid Functioning: 86 18 Below Average       Memory:     NAB Memory Module, Form 1: Standard Score/ T Score Percentile   Total Memory Index 50 <1 Exceptionally Low  List Learning       Total Trials 1-3 5/36 (19) <1 Exceptionally Low    List B 2/12 (34) 5 Well Below Average    Short Delay Free Recall 2/12 (25) 1 Exceptionally Low    Long Delay Free Recall 0/12 (23) <1 Exceptionally Low    Retention Percentage 0 (22) <1 Exceptionally Low    Recognition Discriminability 1 (33) 5 Well Below Average  Shape Learning       Total Trials 1-3 4/27 (19) <1 Exceptionally Low    Delayed Recall 2/9 (25) 1 Exceptionally Low    Retention Percentage 100 (50) 50 Average    Recognition Discriminability 4 (36) 8 Well Below Average  Story Learning       Immediate Recall 36/80 (30) 2 Well Below Average    Delayed Recall 12/40 (30) 2 Well Below Average    Retention Percentage 63 (38) 12 Below Average  Daily Living Memory       Immediate Recall 14/51 (19) <1 Exceptionally Low    Delayed Recall 0/17 (19) <1 Exceptionally Low    Retention Percentage 0 (-4) <1 Exceptionally Low    Recognition Hits 4/10 (  12) <1 Exceptionally Low       Attention/Executive Function:     Oral Trail Making Test (OTMT): Raw Score (Z-Score) Percentile     Part A 8 secs.,  0 errors (-0.33) 37 Average    Part B 143 secs.,  3 errors (-5.84) <1 Exceptionally Low       Symbol Digit Modalities Test (SDMT): Raw Score (Z-Score) Percentile     Oral 12 (-3.01) <1 Exceptionally Low       NAB Attention Module, Form 1: T Score Percentile     Digits Forward 36 8 Well Below Average     Digits Backwards 33 5 Well Below Average        Scaled Score Percentile   WAIS-IV Similarities: 8 25 Average       D-KEFS Color-Word Interference Test: Raw Score (Scaled Score) Percentile     Color Naming 58 secs. (1) <1 Exceptionally Low    Word Reading 50 secs. (1) <1 Exceptionally Low    Inhibition Discontinued --- Impaired    Inhibition/Switching Discontinued --- Impaired       D-KEFS Verbal Fluency Test: Raw Score (Scaled Score) Percentile     Letter Total Correct 26 (7) 16 Below Average    Category Total Correct 23 (6) 9 Below Average    Category Switching Total Correct 5 (2) <1 Exceptionally Low    Category Switching Accuracy 1 (1) <1 Exceptionally Low      Total Set Loss Errors 12 (1) <1 Exceptionally Low      Total Repetition Errors 2 (11) 63 Average       NAB Executive Functions Module, Form 1: T Score Percentile     Judgment 53 62 Average       Language:     Verbal Fluency Test: Raw Score (T Score) Percentile     Phonemic Fluency (FAS) 26 (38) 12 Below Average    Animal Fluency 11 (30) 2 Well Below Average        NAB Language Module, Form 1: T Score Percentile     Naming 29/31 (43) 25 Average       Visuospatial/Visuoconstruction:      Raw Score Percentile   Clock Drawing: 7/10 --- Within Normal Limits        Scaled Score Percentile   WAIS-IV Matrix Reasoning: 6 9 Below Average  WAIS-IV Visual Puzzles: 6 9 Below Average       Mood and Personality:      Raw Score Percentile   Geriatric Depression Scale: 11 --- Mild  PROMIS Anxiety Questionnaire: 12 --- None to Slight       Additional Questionnaires:      Raw Score Percentile   PROMIS Sleep Disturbance Questionnaire: 24 --- None to Slight   Informed Consent and Coding/Compliance:   The current evaluation represents a clinical evaluation for the purposes previously outlined by the referral source and is in no way reflective of a forensic evaluation.   Jeremy Davidson was provided with a verbal description of  the nature and purpose of the present neuropsychological evaluation. Also reviewed were the foreseeable risks and/or discomforts and benefits of the procedure, limits of confidentiality, and mandatory reporting requirements of this provider. The patient was given the opportunity to ask questions and receive answers about the evaluation. Oral consent to participate was provided by the patient.   This evaluation was conducted by Newman Nickels, Ph.D., ABPP-CN, board certified clinical neuropsychologist. Mr. Fayne completed a clinical interview with Dr. Milbert Coulter, billed as  one unit [id number], and 115 minutes of cognitive testing and scoring, billed as one unit [phone] and three additional units [id number]. Psychometrist Shan Levans, B.S. assisted Dr. Milbert Coulter with test administration and scoring procedures. As a separate and discrete service, one unit [id number] and two units [phone] were billed for Dr. Tammy Sours time spent in interpretation and report writing.

## 2023-07-07 NOTE — Progress Notes (Signed)
   Psychometrician Note   Cognitive testing was administered to Jeremy Davidson by Shan Levans, B.S. (psychometrist) under the supervision of Dr. Newman Nickels, Ph.D., licensed psychologist on 07/07/2023. Jeremy Davidson did not appear overtly distressed by the testing session per behavioral observation or responses across self-report questionnaires. Rest breaks were offered.    The battery of tests administered was selected by Dr. Newman Nickels, Ph.D. with consideration to Jeremy Davidson's current level of functioning, the nature of his symptoms, emotional and behavioral responses during interview, level of literacy, observed level of motivation/effort, and the nature of the referral question. This battery was communicated to the psychometrist. Communication between Dr. Newman Nickels, Ph.D. and the psychometrist was ongoing throughout the evaluation and Dr. Newman Nickels, Ph.D. was immediately accessible at all times. Dr. Newman Nickels, Ph.D. provided supervision to the psychometrist on the date of this service to the extent necessary to assure the quality of all services provided.    Jeremy Davidson will return within approximately 1-2 weeks for an interactive feedback session with Dr. Milbert Coulter at which time his test performances, clinical impressions, and treatment recommendations will be reviewed in detail. Jeremy Davidson understands he can contact our office should he require our assistance before this time.  A total of 115 minutes of billable time were spent face-to-face with Jeremy Davidson by the psychometrist. This includes both test administration and scoring time. Billing for these services is reflected in the clinical report generated by Dr. Newman Nickels, Ph.D.  This note reflects time spent with the psychometrician and does not include test scores or any clinical interpretations made by Dr. Milbert Coulter. The full report will follow in a separate note.

## 2023-07-11 ENCOUNTER — Ambulatory Visit: Payer: Medicare HMO

## 2023-07-11 DIAGNOSIS — R471 Dysarthria and anarthria: Secondary | ICD-10-CM

## 2023-07-11 DIAGNOSIS — R1310 Dysphagia, unspecified: Secondary | ICD-10-CM

## 2023-07-11 DIAGNOSIS — R41841 Cognitive communication deficit: Secondary | ICD-10-CM | POA: Diagnosis not present

## 2023-07-11 NOTE — Patient Instructions (Signed)
Signs of Aspiration Pneumonia   Chest pain/tightness Fever (can be low grade) Cough  With foul-smelling phlegm (sputum) With sputum containing pus or blood With greenish sputum Fatigue  Shortness of breath  Wheezing   **IF YOU HAVE THESE SIGNS, CONTACT YOUR DOCTOR OR GO TO THE EMERGENCY DEPARTMENT OR URGENT CARE AS SOON AS POSSIBLE**

## 2023-07-11 NOTE — Therapy (Signed)
OUTPATIENT SPEECH LANGUAGE PATHOLOGY TREATMENT   Patient Name: Jeremy Davidson MRN: [id number] DOB:Mar 17, 1951, 72 y.o., male Today's Date: 07/11/2023  PCP: Jeremy Bussing, MD REFERRING PROVIDER: Darrow Bussing, MD   END OF SESSION:  End of Session - 07/11/23 1149     Visit Number 8    Number of Visits 17    Date for SLP Re-Evaluation 08/12/23    SLP Start Time 1104    SLP Stop Time  1145    SLP Time Calculation (min) 41 min    Activity Tolerance Patient tolerated treatment well                 Past Medical History:  Diagnosis Date   Acute metabolic encephalopathy 05/23/2022   viral URI   Blood in stool 2010   Colonoscopy benign repeat 2015   Difficulty walking 05/23/2022   Esophageal dysmotility    Essential hypertension 09/09/2015   Hardening of the aorta (main artery of the heart)    Hyperlipidemia 09/09/2015   Hyponatremia 05/23/2022   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   Esophageal dysmotility    Hardening of the aorta (main artery of the heart)    Acute metabolic encephalopathy 05/23/2022   Difficulty walking 05/23/2022   Hyponatremia 05/23/2022   Parkinson's disease 03/17/2017   Essential hypertension 09/09/2015   Hyperlipidemia 09/09/2015    ONSET DATE: Approx 8 years ago, script dated 05-24-23  REFERRING DIAG: PD with dyskinesia  THERAPY DIAG:  Dysarthria and anarthria  Cognitive communication deficit  Dysphagia, unspecified type  Rationale for Evaluation and Treatment: Rehabilitation  SUBJECTIVE:   SUBJECTIVE STATEMENT: "He is continuing to get better (with speech volume)."  Pt accompanied by:  significant other Jeremy Davidson   PERTINENT HISTORY: From Jeremy Davidson note 03/08/23: Jeremy Davidson was seen today in follow up for Parkinsons disease.  My previous records were reviewed prior to todays visit as well as outside records available to me.  Wife supplements history.  We slightly increased his levodopa last visit.  He tolerated that well, without side effects.  Last visit, he was having a lot of back pain and referred him to physical therapy.  It looks like he only went twice and never went back.  We also referred him for speech therapy and he went 3 times and did not want to go back.  He states he wasn't sure it helped but wife does think it does and she would like another referral.  No lightheadedness/near syncope.  As far as he knows, BP is running good at home but he's not checking it.    PAIN:  Are you having pain? No  FALLS: Has patient fallen in last 6 months?  Yes, Number of falls: 1 "full fall", and two where pt caught himself before going to the floor  PATIENT GOALS: Improve communication effectiveness  OBJECTIVE:   COGNITION: Overall cognitive status: Impaired Areas of impairment: Attention and Memory Comments: Pt provides example of "completely gone" feeling (losing his desired message) when in conversation.  Pt does not report difficulty with swallowing but pt's swallowing may warrant further evaluation, due to pt's awareness may be decr'd re:  swallowing deficits, so SLP will monitor pt's swallowing during sessions . A bedside swallow eval may be initiated and a MBS may be warranted in this therapy course.  PATIENT REPORTED OUTCOME MEASURES (PROM): Communication Effectiveness Survey: to be administered today and pt answered stimuli to score 21/32 (higher scores=more effective communication).  TODAY'S TREATMENT:                                                                                                                                         DATE:  07/11/23: Pt endorses  rare throat clearing with liquids/meals. Looking back at a DG Esophagus that pt had, he silently aspirated dense barium, and referral was suggested for ST assessment at that time. Given this, SLP recommended MBS at this time. Pt/wife will think about this and will get back to SLP with thought/s whether to go ahead with MBS or wait. Today SLP assisted pt with tasks to improve speech loudness. Loud /a/ averaged 86 dB. SLP then engaged pt about salient and pertinent topics for pt including hobbies. SLP req'd to provide min-mod cues x5 in 9 minutes conversation; Average dB = 68.  07/03/23: Wife states Jeremy Davidson's speech has been louder in the last 3 weeks, "I'm less annoying now than I used to be (asking him to repeat)." Jeremy Davidson) Throughout session SLP explained/educated wife on difficulties particularly with speech volume and spoken language generation, and also explained some compensations she may need to employ due to this. Today pt's /a/ req'd initial cues for extending production (pt started with 2-3 second productions). Jeremy Davidson stated pt /a/ productions are short like this at home sometimes. Averaged 90 dB with occasional cues for open mouth. SLP worked with pt with phrase and short sentence responses with occasional mod A for improving volume. Occasionally SLP would have to repeat stimulus due to incorrect response, SLP asked for another response, and pt would thus forget stimulus. SLP worked with pt and wife to get pt signed back in to [facility]. SLP suggested since pt has cataract sx tomorrow that he wait until his eyesight is recovered and then call company and request 14 day trial restart. He and wife agreeable with this.   06/29/23: Pt educated pt on [facility] and worked with him in downloading and setting up an account. SLP assisted pt occasionally at attention to detail (pushing "next", or looking at all options on the screen prior to hitting something incorrectly while setting up account).  In the cognitive tasks, pt req'd mod A consistently for alternating attention. SLP to focus on speech intelligibility as pt will receive neuropsych eval 07-07-23.  06/27/23: SLP acknowledged pt's wife should attend therapy with pt whenever possible. SLP  asked about pt's swallowing status again (first time asked was at eval), and pt mistakenly answered about his SPEAKING; SLP redirected Yaqoob to answer SLP's question about swallowing and he denied noticing throat clearing and/or coughing  with POs. SLP to cont to monitor. Pt states he is performing loud /a/ and practicing louder speech at home with responses worksheets "not as much as (is prescribed) but I'm doing some." SLP strongly reiterated the need for consistent practice to enhance positive change in speech loudness.  SLP assisted pt with loud /a/, providing consistent mod cues for full breath and for loudness throughout the 10 reps. SLP could not fade cues, as pt req'd mod cues the entire task. SLP then had pt read 50 common conversational sentences. At first pt volume was in mid to upper 60s dB, and he req'd occasional min-mod A for loudness. On the third time through, pt became more consistent with loudness in mid 70s dB. SLP again strongly reiterated consistency with practicing louder speech with responses on worksheets for 20 minutes BID.   06/23/23: SLP reiterated the need for daily practice and provided rationale for this. Daughter tells SLP pt does not exhibit any difficulty with swallowing sx during meals, but wonders (from pt's wife) if there's anything that can be done to prevent swallowing issues. SLP suggested 10 effortful swallows BID. Pt tells SLP that he is using more abdominal musculature recruitment than before ST was initiated, and thus "S" statement. SLP shared about communication at drive [place] and communicating with wait staff are also good barometers for assessing if pt is speaking louder.  Today, pt's /a/ averaged 84dB, with  occasional min A for loudness. Simple word level responses were  highly dependent upon cognitive load (naming one memober from a simple/common category) Occasional increased to usual min cues needed due to mental fatigue - average mid -upper 60s dB. Answering simple questions re: seasons, months, holidays with one word answers req'd occasional mod cues -average was mid 60s dB. SLP provided homework and daughter stated she could assist pt as necessary with reading and answering.   06/20/23: SLP began by explaining rationale for loud /a/ for daughter, and explaining rationale for daily practice sessions at home. Suggested pt do 20-25 minutes BID, separate from loud /a/. During session SLP also explained that due to pt's cognition he may have more difficulty with mod complex conversation and staying at WNL volume.  In divergent naming task (concrete categories) pt req'd usual mod cues for loudness faded to occasional min-mod cues. By session end, pt began indicating when he did not maintain loudness throughout the utterance. SLP pointed this out to pt and daughter and used it as reinforcement for daily practice with /a/ and 20-25 minutes BID. Daughter typed during session as SLP explained things to pt (re-explained) and daughter.  06/13/23: Pt demo'd sx of reduced working memory as he req'd cues spelling 3-4 letter words backwards today. Pt may benefit from formal comm-cog evaluation.  Generated /a/ with average mid 80s dB with occasional mod A for loudness/strength. In phrase responses (pt unable to adequately read information due to cataracts (sx upcoming) pt req'd usual min-mod cues faded to occasional min-mod cues for strength and loudness. Pt faded loudness at end of phrases 50% of the time.  SLP administered PROM today with results above.   06/07/23: SLP shaped pt's /a/ today and explained rationale for loud /a/ (explained to pt as "strong /a/" to minimize vocal strain and laryngeal effort). SLP explained  treatment options and pt chose a less-programmed option (a plan that would NOT include Spe[employer]!).   PATIENT EDUCATION: Education details: see "today's treatment" Person educated: Patient and daughter Education method: Explanation, Demonstration, Verbal cues, and Handouts Education  comprehension: verbalized understanding, returned demonstration, verbal cues required, and needs further education  HOME EXERCISE PROGRAM: 10 strong "ah" BID. Eventually, pt will complete glides and everyday sentences, when and if clinically appropriate.   GOALS: Goals reviewed with patient? No  SHORT TERM GOALS: Target date: 07/13/23  pt will produce loud /a/ or "hey!" with at least upper 80s dB average over three sessions Baseline: 07/03/23 Goal status: Partially met  2.  Pt will produce 16/20 sentence responses with WNL volume over two sessions Baseline:  Goal status: not met  3.  Pt will produce 3 minutes simple conversation with WNL volume over three sessions Baseline:  Goal status: not met  4.  Pt will tell SLP benefit of brain-stimulating tasks (eg, "[facility]", "lumosity", sudoku, etc) Baseline:  Goal status: deferred to work on speech  5.  Pt will undergo bedside swallow assessment if clinically indicated Baseline:  Goal status: Deferred - pt to get back with SLP if MBS desired   LONG TERM GOALS: Target date: 08/12/23  Pt will undergo MBS if clinically indicated Baseline:  Goal status: INITIAL  2.  Pt will produce 10 minutes simple-mod complex conversation with WNL volume given rare nonverbal cues over three sessions Baseline:  Goal status: INITIAL  3.  Pt's PROM will show improvement from initial administration Baseline:  Goal status: INITIAL  4.  Pt will report utilizing memory strategies to improve overall memory, in or between 3 sessions  Baseline:  Goal status: INITIAL  ASSESSMENT:  CLINICAL IMPRESSION: Patient is a 72 y.o. M who was seen today for treatment of  speech skills in light of his Parkinson disease diagnosed approx 8 years ago. Pt progress in ST continues highly dependent upon cognitive load necessary to participate in dyad conversation/responses, and could be further complicated by pt's consistency with homework. See "today's treatment" for details. On 07/11/23, SLP suggested MBS given pt's silent aspiration of dense barium during esophagram in January 2024. Pt without s/sx PNA this year. Pt had neurocognitive eval on 07-07-23 and follow up is 07/25/23, so no formal cognitive communication eval in ST will occur at this time.   OBJECTIVE IMPAIRMENTS: Objective impairments include attention, memory, dysarthria, and dysphagia. These impairments are limiting patient from managing medications, managing appointments, managing finances, household responsibilities, ADLs/IADLs, effectively communicating at home and in community, and safety when swallowing.Factors affecting potential to achieve goals and functional outcome are ability to learn/carryover information, cooperation/participation level, and previous level of function.. Patient will benefit from skilled SLP services to address above impairments and improve overall function.  REHAB POTENTIAL: Good  PLAN:  SLP FREQUENCY: 2x/week  SLP DURATION: 8 weeks  PLANNED INTERVENTIONS: Aspiration precaution training, Pharyngeal strengthening exercises, Diet toleration management , Environmental controls, Trials of upgraded texture/liquids, Cueing hierachy, Cognitive reorganization, Internal/external aids, Functional tasks, SLP instruction and feedback, Compensatory strategies, Patient/family education, and MBS (possible)    Lenda Baratta, CCC-SLP 07/11/2023, 11:49 AM

## 2023-07-13 ENCOUNTER — Ambulatory Visit: Payer: Medicare HMO

## 2023-07-13 DIAGNOSIS — R471 Dysarthria and anarthria: Secondary | ICD-10-CM

## 2023-07-13 DIAGNOSIS — R41841 Cognitive communication deficit: Secondary | ICD-10-CM | POA: Diagnosis not present

## 2023-07-13 DIAGNOSIS — R1310 Dysphagia, unspecified: Secondary | ICD-10-CM | POA: Diagnosis not present

## 2023-07-13 NOTE — Therapy (Signed)
OUTPATIENT SPEECH LANGUAGE PATHOLOGY TREATMENT   Patient Name: Jeremy Davidson MRN: [id number] DOB:Sep 04, 1951, 72 y.o., male Today's Date: 07/13/2023  PCP: Darrow Bussing, MD REFERRING PROVIDER: Darrow Bussing, MD   END OF SESSION:  End of Session - 07/13/23 1128     Visit Number 9    Number of Visits 17    Date for SLP Re-Evaluation 08/12/23    SLP Start Time 1113    SLP Stop Time  1145    SLP Time Calculation (min) 32 min    Activity Tolerance Patient tolerated treatment well                 Past Medical History:  Diagnosis Date   Acute metabolic encephalopathy 05/23/2022   viral URI   Blood in stool 2010   Colonoscopy benign repeat 2015   Difficulty walking 05/23/2022   Esophageal dysmotility    Essential hypertension 09/09/2015   Hardening of the aorta (main artery of the heart)    Hyperlipidemia 09/09/2015   Hyponatremia 05/23/2022   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   Esophageal dysmotility    Hardening of the aorta (main artery of the heart)    Acute metabolic encephalopathy 05/23/2022   Difficulty walking 05/23/2022   Hyponatremia 05/23/2022   Parkinson's disease 03/17/2017   Essential hypertension 09/09/2015   Hyperlipidemia 09/09/2015    ONSET DATE: Approx 8 years ago, script dated 05-24-23  REFERRING DIAG: PD with dyskinesia  THERAPY DIAG:  Dysarthria and anarthria  Cognitive communication deficit  Rationale for Evaluation and Treatment: Rehabilitation  SUBJECTIVE:   SUBJECTIVE STATEMENT: "I'm remembering more (to talk louder)."  Pt accompanied by: significant other Donna   PERTINENT HISTORY: From  Dr. Don Perking note 03/08/23: [name] was seen today in follow up for Parkinsons disease.  My previous records were reviewed prior to todays visit as well as outside records available to me.  Wife supplements history.  We slightly increased his levodopa last visit.  He tolerated that well, without side effects.  Last visit, he was having a lot of back pain and referred him to physical therapy.  It looks like he only went twice and never went back.  We also referred him for speech therapy and he went 3 times and did not want to go back.  He states he wasn't sure it helped but wife does think it does and she would like another referral.  No lightheadedness/near syncope.  As far as he knows, BP is running good at home but he's not checking it.   DIAGNOSTIC FINDINGS: Neuropsych Eval 07/07/23: Clinical Impression(s): [name] pattern of performance is suggestive of fairly diffuse cognitive impairment. His greatest areas of impairment surrounded processing speed, attention/concentration, and all aspects of learning and memory. Further weakness/variability was exhibited across executive functioning, especially cognitive flexibility and response inhibition, as well as verbal fluency. Performances were appropriate relative to age-matched peers across abstract reasoning, safety/judgment, confrontation naming, and visuospatial abilities. Functionally, his wife has fully taken over medication management, financial management, and bill paying responsibilities. He also recently decided to stop driving of his own volition due to perceived deficits surrounding reaction time  and other cognitive abilities. Given evidence for cognitive impairment and the likelihood that it is directly interfering with day-to-day functioning, I believe that [name] best meets diagnostic criteria for a Major Neurocognitive Disorder ("dementia") at the present time.   The most likely etiology for his fairly mild dementia  presentation would appear to be his history of Parkinson's disease. His patterns of impairment across testing align well with what is expected with this sort of illness. There remains the potential that his bout of metabolic encephalopathy caused by an acute upper respiratory illness in August 2023 could be influencing his overall degree of functioning. However, presentations like this typically improve over time and do not exhibit progressive decline as described by both Mr. Fyffe and his wife. Despite concern surrounding visual hallucinations, current testing patterns are not particularly compelling with regard to Lewy body disease given relatively intact visuospatial abilities. Hallucinations may be better described as illusions. He was fairly vague when describing overall alcohol consumption and this could also be playing a relatively mild role in ongoing dysfunction. There is no recent neuroimaging as his most recent brain MRI appears to have been completed in 2018. As such, I cannot theorize about other potential anatomical contributions to ongoing dysfunction. Continued medical monitoring will be important moving forward.   PAIN:  Are you having pain? No  FALLS: Has patient fallen in last 6 months?  Yes, Number of falls: 1 "full fall", and two where pt caught himself before going to the floor  PATIENT GOALS: Improve communication effectiveness  OBJECTIVE:   PATIENT REPORTED OUTCOME MEASURES (PROM): Communication Effectiveness Survey: to be administered today and pt answered stimuli to score 21/32 (higher scores=more effective communication).  TODAY'S TREATMENT:                                                                                                                                         DATE:  07/13/23: Pt will be asked about thoughts about MBS next session. Pt completed loud /a/ with average of mid-upper 80s dB. Today in simple conversation the patient exhibited WNL loudness with  occasional cues for "shout", which were successful in pt incr'ing loudness 75% of the time.  SLP cont'd to encourage pt to practice at home every day with louder speech, and to complete loud /a/ BID as well. SLP reviewed some tips on successful communication today with pt and wife, including the fact that pt may cont to talk softly when requiring to multitask with verbal content and necessity to talk louder. Pt demonstrated understanding, as did wife.  07/11/23: Pt endorses rare throat clearing with liquids/meals. Looking back at a DG Esophagus that pt had, he silently aspirated dense barium, and referral was suggested for ST assessment at that time. Given this, SLP recommended MBS at this time. Pt/wife will think about this and will get back to SLP with thought/s whether to go ahead with  MBS or wait. Today SLP assisted pt with tasks to improve speech loudness. Loud /a/ averaged 86 dB. SLP then engaged pt about salient and pertinent topics for pt including hobbies. SLP req'd to provide min-mod cues x5 in 9 minutes conversation; Average dB = 68.  07/03/23: Wife states Keni's speech has been louder in the last 3 weeks, "I'm less annoying now than I used to be (asking him to repeat)." [name]) Throughout session SLP explained/educated wife on difficulties particularly with speech volume and spoken language generation, and also explained some compensations she may need to employ due to this. Today pt's /a/ req'd initial cues for extending production (pt started with 2-3 second productions). [name] stated pt /a/ productions are short like this at home sometimes. Averaged 90 dB with occasional cues for open mouth. SLP worked with pt with phrase and short sentence responses with occasional mod A for improving volume. Occasionally SLP would have to repeat stimulus due to incorrect response, SLP asked for another response, and pt would thus forget stimulus. SLP worked with pt and wife to get pt signed back in to  [facility]. SLP suggested since pt has cataract sx tomorrow that he wait until his eyesight is recovered and then call company and request 14 day trial restart. He and wife agreeable with this.   06/29/23: Pt educated pt on [facility] and worked with him in downloading and setting up an account. SLP assisted pt occasionally at attention to detail (pushing "next", or looking at all options on the screen prior to hitting something incorrectly while setting up account). In the cognitive tasks, pt req'd mod A consistently for alternating attention. SLP to focus on speech intelligibility as pt will receive neuropsych eval 07-07-23.  06/27/23: SLP acknowledged pt's wife should attend therapy with pt whenever possible. SLP  asked about pt's swallowing status again (first time asked was at eval), and pt mistakenly answered about his SPEAKING; SLP redirected Candelario to answer SLP's question about swallowing and he denied noticing throat clearing and/or coughing with POs. SLP to cont to monitor. Pt states he is performing loud /a/ and practicing louder speech at home with responses worksheets "not as much as (is prescribed) but I'm doing some." SLP strongly reiterated the need for consistent practice to enhance positive change in speech loudness.  SLP assisted pt with loud /a/, providing consistent mod cues for full breath and for loudness throughout the 10 reps. SLP could not fade cues, as pt req'd mod cues the entire task. SLP then had pt read 50 common conversational sentences. At first pt volume was in mid to upper 60s dB, and he req'd occasional min-mod A for loudness. On the third time through, pt became more consistent with loudness in mid 70s dB. SLP again strongly reiterated consistency with practicing louder speech with responses on worksheets for 20 minutes BID.   06/23/23: SLP reiterated the need for daily practice and provided rationale for this. Daughter tells SLP pt does not exhibit any  difficulty with swallowing sx during meals, but wonders (from pt's wife) if there's anything that can be done to prevent swallowing issues. SLP suggested 10 effortful swallows BID. Pt tells SLP that he is using more abdominal musculature recruitment than before ST was initiated, and thus "S" statement. SLP shared about communication at drive thr[place] and communicating with wait staff are also good barometers for assessing if pt is speaking louder.  Today, pt's /a/ averaged 84dB, with occasional min A for loudness. Simple word level  responses were  highly dependent upon cognitive load (naming one memober from a simple/common category) Occasional increased to usual min cues needed due to mental fatigue - average mid -upper 60s dB. Answering simple questions re: seasons, months, holidays with one word answers req'd occasional mod cues -average was mid 60s dB. SLP provided homework and daughter stated she could assist pt as necessary with reading and answering.   06/20/23: SLP began by explaining rationale for loud /a/ for daughter, and explaining rationale for daily practice sessions at home. Suggested pt do 20-25 minutes BID, separate from loud /a/. During session SLP also explained that due to pt's cognition he may have more difficulty with mod complex conversation and staying at WNL volume.  In divergent naming task (concrete categories) pt req'd usual mod cues for loudness faded to occasional min-mod cues. By session end, pt began indicating when he did not maintain loudness throughout the utterance. SLP pointed this out to pt and daughter and used it as reinforcement for daily practice with /a/ and 20-25 minutes BID. Daughter typed during session as SLP explained things to pt (re-explained) and daughter.  06/13/23: Pt demo'd sx of reduced working memory as he req'd cues spelling 3-4 letter words backwards today. Pt may benefit from formal comm-cog evaluation.  Generated /a/ with average mid 80s dB with  occasional mod A for loudness/strength. In phrase responses (pt unable to adequately read information due to cataracts (sx upcoming) pt req'd usual min-mod cues faded to occasional min-mod cues for strength and loudness. Pt faded loudness at end of phrases 50% of the time.  SLP administered PROM today with results above.   06/07/23: SLP shaped pt's /a/ today and explained rationale for loud /a/ (explained to pt as "strong /a/" to minimize vocal strain and laryngeal effort). SLP explained treatment options and pt chose a less-programmed option (a plan that would NOT include Spe[employer]!).   PATIENT EDUCATION: Education details: see "today's treatment" Person educated: Patient and daughter Education method: Explanation, Demonstration, Verbal cues, and Handouts Education comprehension: verbalized understanding, returned demonstration, verbal cues required, and needs further education  HOME EXERCISE PROGRAM: 10 strong "ah" BID. Eventually, pt will complete glides and everyday sentences, when and if clinically appropriate.   GOALS: Goals reviewed with patient? No  SHORT TERM GOALS: Target date: 07/13/23  pt will produce loud /a/ or "hey!" with at least upper 80s dB average over three sessions Baseline: 07/03/23 Goal status: Partially met  2.  Pt will produce 16/20 sentence responses with WNL volume over two sessions Baseline:  Goal status: not met  3.  Pt will produce 3 minutes simple conversation with WNL volume over three sessions Baseline:  Goal status: not met  4.  Pt will tell SLP benefit of brain-stimulating tasks (eg, "[facility]", "lumosity", sudoku, etc) Baseline:  Goal status: deferred to work on speech  5.  Pt will undergo bedside swallow assessment if clinically indicated Baseline:  Goal status: Deferred - pt to get back with SLP if MBS desired   LONG TERM GOALS: Target date: 08/12/23  Pt will undergo MBS if clinically indicated Baseline:  Goal status:  INITIAL  2.  Pt will produce 10 minutes simple-mod complex conversation with WNL volume given rare nonverbal cues over three sessions Baseline:  Goal status: INITIAL  3.  Pt's PROM will show improvement from initial administration Baseline:  Goal status: INITIAL  4.  Pt will report utilizing memory strategies to improve overall memory, in or between 3 sessions  Baseline:  Goal status: INITIAL  ASSESSMENT:  CLINICAL IMPRESSION: Patient is a 72 y.o. M who was seen today for treatment of speech skills in light of his Parkinson disease diagnosed approx 8 years ago. Pt progress in ST continues highly dependent upon cognitive load necessary to participate in dyad conversation/responses, and could be further complicated by pt's consistency with homework. See "today's treatment" for details. On 07/11/23, SLP suggested MBS given pt's silent aspiration of dense barium during esophagram in January 2024. Pt without s/sx PNA this year. Pt had neurocognitive eval on 07-07-23 and follow up is 07/25/23, so no formal cognitive communication eval in ST will occur at this time.   OBJECTIVE IMPAIRMENTS: Objective impairments include attention, memory, dysarthria, and dysphagia. These impairments are limiting patient from managing medications, managing appointments, managing finances, household responsibilities, ADLs/IADLs, effectively communicating at home and in community, and safety when swallowing.Factors affecting potential to achieve goals and functional outcome are ability to learn/carryover information, cooperation/participation level, and previous level of function.. Patient will benefit from skilled SLP services to address above impairments and improve overall function.  REHAB POTENTIAL: Good  PLAN:  SLP FREQUENCY: 2x/week  SLP DURATION: 8 weeks  PLANNED INTERVENTIONS: Aspiration precaution training, Pharyngeal strengthening exercises, Diet toleration management , Environmental controls, Trials of  upgraded texture/liquids, Cueing hierachy, Cognitive reorganization, Internal/external aids, Functional tasks, SLP instruction and feedback, Compensatory strategies, Patient/family education, and MBS (possible)    Mellie Buccellato, CCC-SLP 07/13/2023, 11:29 AM

## 2023-07-18 ENCOUNTER — Ambulatory Visit: Payer: Medicare HMO | Attending: Neurology

## 2023-07-18 DIAGNOSIS — H25011 Cortical age-related cataract, right eye: Secondary | ICD-10-CM | POA: Diagnosis not present

## 2023-07-18 DIAGNOSIS — H25811 Combined forms of age-related cataract, right eye: Secondary | ICD-10-CM | POA: Diagnosis not present

## 2023-07-18 DIAGNOSIS — H2511 Age-related nuclear cataract, right eye: Secondary | ICD-10-CM | POA: Diagnosis not present

## 2023-07-18 DIAGNOSIS — Z961 Presence of intraocular lens: Secondary | ICD-10-CM | POA: Diagnosis not present

## 2023-07-19 ENCOUNTER — Ambulatory Visit: Payer: Medicare HMO | Attending: Family Medicine

## 2023-07-19 DIAGNOSIS — R471 Dysarthria and anarthria: Secondary | ICD-10-CM | POA: Diagnosis not present

## 2023-07-19 DIAGNOSIS — R1310 Dysphagia, unspecified: Secondary | ICD-10-CM | POA: Insufficient documentation

## 2023-07-19 DIAGNOSIS — R41841 Cognitive communication deficit: Secondary | ICD-10-CM | POA: Insufficient documentation

## 2023-07-19 NOTE — Therapy (Signed)
OUTPATIENT SPEECH LANGUAGE PATHOLOGY TREATMENT/PROGRESS NOTE   Patient Name: Jeremy Davidson MRN: [id number] DOB:1951-02-22, 73 y.o., male Today's Date: 07/19/2023  PCP: Darrow Bussing, MD REFERRING PROVIDER: Darrow Bussing, MD   END OF SESSION:  End of Session - 07/19/23 1506     Visit Number 10    Number of Visits 17    Date for SLP Re-Evaluation 08/12/23    SLP Start Time 1103    SLP Stop Time  1142    SLP Time Calculation (min) 39 min    Activity Tolerance Patient tolerated treatment well                  Past Medical History:  Diagnosis Date   Acute metabolic encephalopathy 05/23/2022   viral URI   Blood in stool 2010   Colonoscopy benign repeat 2015   Difficulty walking 05/23/2022   Esophageal dysmotility    Essential hypertension 09/09/2015   Hardening of the aorta (main artery of the heart)    Hyperlipidemia 09/09/2015   Hyponatremia 05/23/2022   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 He has just decided he would like to have a MBS to assess current swallow ability. He had silent aspiration with an esophagram in January 2024 and speech swallow eval was recommended  but pt did not choose to do so at that time.  Objective: Pt's speech volume has improved at home, according to wife and pt. When he is asked to increase volume at home pt does so after first request. Prior to ST pt was unable to incr loudness much of the time according to wife.  Goal Update: See below.  Plan: See pt for 4-8 more sessions  Reason Skilled Services are Required: Pt has not fully maximized rehab potential     ONSET DATE: Approx 8 years ago, script dated 05-24-23  REFERRING DIAG: PD with dyskinesia  THERAPY DIAG:  Dysarthria and anarthria  Cognitive communication deficit  Dysphagia, unspecified type  Rationale for Evaluation and Treatment: Rehabilitation  SUBJECTIVE:   SUBJECTIVE STATEMENT: [name] cont to report pt's loudness at home has improved.   Pt accompanied by: significant other Donna   PERTINENT HISTORY: From Dr. Don Perking note 03/08/23: [name] was seen today in follow up for Parkinsons disease.  My previous records were reviewed prior to todays visit as well as outside records available to me.  Wife supplements history.  We slightly increased his levodopa last visit.  He tolerated that well, without side effects.  Last visit, he was having a lot of back pain and referred him to physical therapy.  It looks like he only went twice and never went back.  We also referred him for speech  therapy and he went 3 times and did not want to go back.  He states he wasn't sure it helped but wife does think it does and she would like another referral.  No lightheadedness/near syncope.  As far as he knows, BP is running good at home but he's not checking it.   DIAGNOSTIC FINDINGS: Neuropsych Eval 07/07/23: Clinical Impression(s): Mr. Salazar pattern of performance is suggestive of fairly diffuse cognitive impairment. His greatest areas of impairment surrounded processing speed, attention/concentration, and all aspects of learning and memory. Further  weakness/variability was exhibited across executive functioning, especially cognitive flexibility and response inhibition, as well as verbal fluency. Performances were appropriate relative to age-matched peers across abstract reasoning, safety/judgment, confrontation naming, and visuospatial abilities. Functionally, his wife has fully taken over medication management, financial management, and bill paying responsibilities. He also recently decided to stop driving of his own volition due to perceived deficits surrounding reaction time and other cognitive abilities. Given evidence for cognitive impairment and the likelihood that it is directly interfering with day-to-day functioning, I believe that Mr. Downard best meets diagnostic criteria for a Major Neurocognitive Disorder ("dementia") at the present time.   The most likely etiology for his fairly mild dementia presentation would appear to be his history of Parkinson's disease. His patterns of impairment across testing align well with what is expected with this sort of illness. There remains the potential that his bout of metabolic encephalopathy caused by an acute upper respiratory illness in August 2023 could be influencing his overall degree of functioning. However, presentations like this typically improve over time and do not exhibit progressive decline as described by both Mr. Hilbun and his wife. Despite concern surrounding visual hallucinations, current testing patterns are not particularly compelling with regard to Lewy body disease given relatively intact visuospatial abilities. Hallucinations may be better described as illusions. He was fairly vague when describing overall alcohol consumption and this could also be playing a relatively mild role in ongoing dysfunction. There is no recent neuroimaging as his most recent brain MRI appears to have been completed in 2018. As such, I cannot theorize about other potential anatomical contributions to  ongoing dysfunction. Continued medical monitoring will be important moving forward.   PAIN:  Are you having pain? No  FALLS: Has patient fallen in last 6 months?  Yes, Number of falls: 1 "full fall", and two where pt caught himself before going to the floor  PATIENT GOALS: Improve communication effectiveness  OBJECTIVE:   PATIENT REPORTED OUTCOME MEASURES (PROM): Communication Effectiveness Survey: pt answered stimuli to score 21/32 (higher scores=more effective communication).  TODAY'S TREATMENT:                                                                                                                                         DATE:  07/19/23: Pt would like to pursue MBS. SLP to send order today. SLP explained to  pt and [name] the reason for the MBS, and possible results/outcomes.   Today pt completed loud /a/ with average of mid-upper 80s dB. SLP and pt went outdoors and today in simple conversation the patient exhibited WNL loudness with rare min cues for improving loudness. Wife states when she asks pt to incr loudness he can do so with one request; Prior to this pt was unable to improve loudness.   07/13/23: Pt will be asked about thoughts about MBS next session. Pt completed loud /a/ with average of mid-upper 80s dB. Today in simple conversation the patient exhibited WNL loudness with occasional cues for "shout", which were successful in pt incr'ing loudness 75% of the time.  SLP cont'd to encourage pt to practice at home every day with louder speech, and to complete loud /a/ BID as well. SLP reviewed some tips on successful communication today with pt and wife, including the fact that pt may cont to talk softly when requiring to multitask with verbal content and necessity to talk louder. Pt demonstrated understanding, as did wife.  07/11/23: Pt endorses rare throat clearing with liquids/meals. Looking back at a DG Esophagus that pt had, he silently aspirated dense barium, and referral  was suggested for ST assessment at that time. Given this, SLP recommended MBS at this time. Pt/wife will think about this and will get back to SLP with thought/s whether to go ahead with MBS or wait. Today SLP assisted pt with tasks to improve speech loudness. Loud /a/ averaged 86 dB. SLP then engaged pt about salient and pertinent topics for pt including hobbies. SLP req'd to provide min-mod cues x5 in 9 minutes conversation; Average dB = 68.  07/03/23: Wife states Javonte's speech has been louder in the last 3 weeks, "I'm less annoying now than I used to be (asking him to repeat)." [name]) Throughout session SLP explained/educated wife on difficulties particularly with speech volume and spoken language generation, and also explained some compensations she may need to employ due to this. Today pt's /a/ req'd initial cues for extending production (pt started with 2-3 second productions). [name] stated pt /a/ productions are short like this at home sometimes. Averaged 90 dB with occasional cues for open mouth. SLP worked with pt with phrase and short sentence responses with occasional mod A for improving volume. Occasionally SLP would have to repeat stimulus due to incorrect response, SLP asked for another response, and pt would thus forget stimulus. SLP worked with pt and wife to get pt signed back in to [facility]. SLP suggested since pt has cataract sx tomorrow that he wait until his eyesight is recovered and then call company and request 14 day trial restart. He and wife agreeable with this.   06/29/23: Pt educated pt on [facility] and worked with him in downloading and setting up an account. SLP assisted pt occasionally at attention to detail (pushing "next", or looking at all options on the screen prior to hitting something incorrectly while setting up account). In the cognitive tasks, pt req'd mod A consistently for alternating attention. SLP to focus on speech intelligibility as pt will  receive neuropsych eval 07-07-23.  06/27/23: SLP acknowledged pt's wife should attend therapy with pt whenever possible. SLP  asked about pt's swallowing status again (first time asked was at eval), and pt mistakenly answered about his SPEAKING; SLP redirected Safi to answer SLP's question about swallowing and he denied noticing throat clearing and/or coughing with POs. SLP to cont to monitor. Pt states he  is performing loud /a/ and practicing louder speech at home with responses worksheets "not as much as (is prescribed) but I'm doing some." SLP strongly reiterated the need for consistent practice to enhance positive change in speech loudness.  SLP assisted pt with loud /a/, providing consistent mod cues for full breath and for loudness throughout the 10 reps. SLP could not fade cues, as pt req'd mod cues the entire task. SLP then had pt read 50 common conversational sentences. At first pt volume was in mid to upper 60s dB, and he req'd occasional min-mod A for loudness. On the third time through, pt became more consistent with loudness in mid 70s dB. SLP again strongly reiterated consistency with practicing louder speech with responses on worksheets for 20 minutes BID.   06/23/23: SLP reiterated the need for daily practice and provided rationale for this. Daughter tells SLP pt does not exhibit any difficulty with swallowing sx during meals, but wonders (from pt's wife) if there's anything that can be done to prevent swallowing issues. SLP suggested 10 effortful swallows BID. Pt tells SLP that he is using more abdominal musculature recruitment than before ST was initiated, and thus "S" statement. SLP shared about communication at drive [place] and communicating with wait staff are also good barometers for assessing if pt is speaking louder.  Today, pt's /a/ averaged 84dB, with occasional min A for loudness. Simple word level responses were  highly dependent upon cognitive load (naming one memober from a  simple/common category) Occasional increased to usual min cues needed due to mental fatigue - average mid -upper 60s dB. Answering simple questions re: seasons, months, holidays with one word answers req'd occasional mod cues -average was mid 60s dB. SLP provided homework and daughter stated she could assist pt as necessary with reading and answering.   06/20/23: SLP began by explaining rationale for loud /a/ for daughter, and explaining rationale for daily practice sessions at home. Suggested pt do 20-25 minutes BID, separate from loud /a/. During session SLP also explained that due to pt's cognition he may have more difficulty with mod complex conversation and staying at WNL volume.  In divergent naming task (concrete categories) pt req'd usual mod cues for loudness faded to occasional min-mod cues. By session end, pt began indicating when he did not maintain loudness throughout the utterance. SLP pointed this out to pt and daughter and used it as reinforcement for daily practice with /a/ and 20-25 minutes BID. Daughter typed during session as SLP explained things to pt (re-explained) and daughter.  06/13/23: Pt demo'd sx of reduced working memory as he req'd cues spelling 3-4 letter words backwards today. Pt may benefit from formal comm-cog evaluation.  Generated /a/ with average mid 80s dB with occasional mod A for loudness/strength. In phrase responses (pt unable to adequately read information due to cataracts (sx upcoming) pt req'd usual min-mod cues faded to occasional min-mod cues for strength and loudness. Pt faded loudness at end of phrases 50% of the time.  SLP administered PROM today with results above.   06/07/23: SLP shaped pt's /a/ today and explained rationale for loud /a/ (explained to pt as "strong /a/" to minimize vocal strain and laryngeal effort). SLP explained treatment options and pt chose a less-programmed option (a plan that would NOT include SpeakOut!).   PATIENT  EDUCATION: Education details: see "today's treatment" Person educated: Patient and daughter Education method: Explanation, Demonstration, Verbal cues, and Handouts Education comprehension: verbalized understanding, returned demonstration, verbal cues required, and needs  further education  HOME EXERCISE PROGRAM: 10 strong "ah" BID. Eventually, pt will complete glides and everyday sentences, when and if clinically appropriate.   GOALS: Goals reviewed with patient? No  SHORT TERM GOALS: Target date: 07/13/23  pt will produce loud /a/ or "hey!" with at least upper 80s dB average over three sessions Baseline: 07/03/23 Goal status: Partially met  2.  Pt will produce 16/20 sentence responses with WNL volume over two sessions Baseline:  Goal status: not met  3.  Pt will produce 3 minutes simple conversation with WNL volume over three sessions Baseline:  Goal status: not met  4.  Pt will tell SLP benefit of brain-stimulating tasks (eg, "[facility]", "lumosity", sudoku, etc) Baseline:  Goal status: deferred to work on speech  5.  Pt will undergo bedside swallow assessment if clinically indicated Baseline:  Goal status: Deferred - pt to get back with SLP if MBS desired   LONG TERM GOALS: Target date: 08/12/23  Pt will undergo MBS if clinically indicated Baseline:  Goal status: INITIAL  2.  Pt will produce 10 minutes simple-mod complex conversation with WNL volume given rare nonverbal cues over three sessions Baseline: 07/19/23 Goal status: INITIAL  3.  Pt's PROM will show improvement from initial administration Baseline:  Goal status: INITIAL  4.  Pt will report utilizing memory strategies to improve overall memory, in or between 3 sessions  Baseline:  Goal status: INITIAL  ASSESSMENT:  CLINICAL IMPRESSION: SLP inquired to referring MD about MBS on 07/19/23 - SLP sent script for cosign. Patient is a 72 y.o. M who was seen today for treatment of speech skills in light  of his Parkinson disease diagnosed approx 8 years ago. Pt progress in ST continues highly dependent upon cognitive load necessary to participate in dyad conversation/responses, and could be further complicated by pt's consistency with homework. See "today's treatment" for details. Pt without s/sx PNA this year. Pt had neurocognitive eval on 07-07-23 and follow up is 07/25/23, so no formal cognitive communication eval in ST will occur at this time.   OBJECTIVE IMPAIRMENTS: Objective impairments include attention, memory, dysarthria, and dysphagia. These impairments are limiting patient from managing medications, managing appointments, managing finances, household responsibilities, ADLs/IADLs, effectively communicating at home and in community, and safety when swallowing.Factors affecting potential to achieve goals and functional outcome are ability to learn/carryover information, cooperation/participation level, and previous level of function.. Patient will benefit from skilled SLP services to address above impairments and improve overall function.  REHAB POTENTIAL: Good  PLAN:  SLP FREQUENCY: 2x/week  SLP DURATION: 8 weeks  PLANNED INTERVENTIONS: Aspiration precaution training, Pharyngeal strengthening exercises, Diet toleration management , Environmental controls, Trials of upgraded texture/liquids, Cueing hierachy, Cognitive reorganization, Internal/external aids, Functional tasks, SLP instruction and feedback, Compensatory strategies, Patient/family education, and MBS (possible)    Janisha Bueso, CCC-SLP 07/19/2023, 3:07 PM

## 2023-07-25 ENCOUNTER — Telehealth (HOSPITAL_COMMUNITY): Payer: Self-pay | Admitting: *Deleted

## 2023-07-25 ENCOUNTER — Other Ambulatory Visit (HOSPITAL_COMMUNITY): Payer: Self-pay | Admitting: *Deleted

## 2023-07-25 ENCOUNTER — Ambulatory Visit: Payer: Medicare HMO | Admitting: Psychology

## 2023-07-25 DIAGNOSIS — R1310 Dysphagia, unspecified: Secondary | ICD-10-CM

## 2023-07-25 DIAGNOSIS — F02A0 Dementia in other diseases classified elsewhere, mild, without behavioral disturbance, psychotic disturbance, mood disturbance, and anxiety: Secondary | ICD-10-CM | POA: Diagnosis not present

## 2023-07-25 DIAGNOSIS — G20A1 Parkinson's disease without dyskinesia, without mention of fluctuations: Secondary | ICD-10-CM | POA: Diagnosis not present

## 2023-07-25 NOTE — Telephone Encounter (Signed)
Attempted to contact patients wife to schedule OP MBS. Left VM. RKEEL

## 2023-07-25 NOTE — Progress Notes (Signed)
   Neuropsychology Feedback Session [name]. [facility] [place] Department of Neurology  Reason for Referral:   Jeremy Davidson is a 72 y.o. right-handed Caucasian male referred by Kerin Salen, D.O., to characterize his current cognitive functioning and assist with diagnostic clarity and treatment planning in the context of Parkinson's disease and concern for progressive cognitive decline.   Feedback:   Jeremy Davidson completed a comprehensive neuropsychological evaluation on 07/07/2023. Please refer to that encounter for the full report and recommendations. Briefly, results suggested fairly diffuse cognitive impairment. His greatest areas of impairment surrounded processing speed, attention/concentration, and all aspects of learning and memory. Further weakness/variability was exhibited across executive functioning, especially cognitive flexibility and response inhibition, as well as verbal fluency. The most likely etiology for his fairly mild dementia presentation would appear to be his history of Parkinson's disease. His patterns of impairment across testing align well with what is expected with this sort of illness. There remains the potential that his bout of metabolic encephalopathy caused by an acute upper respiratory illness in August 2023 could be influencing his overall degree of functioning. However, presentations like this typically improve over time and do not exhibit progressive decline as described by both Jeremy Davidson and his wife. Despite concern surrounding visual hallucinations, current testing patterns are not particularly compelling with regard to Lewy body disease given relatively intact visuospatial abilities.   Jeremy Davidson was accompanied by his wife during the current feedback session. Content of the current session focused on the results of his neuropsychological evaluation. Jeremy Davidson was given the opportunity to ask questions and his questions were answered.  He was encouraged to reach out should additional questions arise. A copy of his report was provided at the conclusion of the visit.      One unit [phone] was billed for Dr. Tammy Sours time spent preparing for, conducting, and documenting the current feedback session with Jeremy Davidson.

## 2023-07-26 ENCOUNTER — Ambulatory Visit: Payer: Medicare HMO

## 2023-07-26 DIAGNOSIS — R1310 Dysphagia, unspecified: Secondary | ICD-10-CM | POA: Diagnosis not present

## 2023-07-26 DIAGNOSIS — R471 Dysarthria and anarthria: Secondary | ICD-10-CM

## 2023-07-26 DIAGNOSIS — R41841 Cognitive communication deficit: Secondary | ICD-10-CM | POA: Diagnosis not present

## 2023-07-26 NOTE — Therapy (Signed)
OUTPATIENT SPEECH LANGUAGE PATHOLOGY TREATMENT/   Patient Name: Jeremy Davidson MRN: [id number] DOB:02-Aug-1951, 72 y.o., male Today's Date: 07/26/2023  PCP: Jeremy Bussing, MD REFERRING PROVIDER: Darrow Bussing, MD   END OF SESSION:  End of Session - 07/26/23 1200     Visit Number 11    Number of Visits 17    Date for SLP Re-Evaluation 08/12/23    SLP Start Time 1105    SLP Stop Time  1146    SLP Time Calculation (min) 41 min    Activity Tolerance Patient tolerated treatment well                   Past Medical History:  Diagnosis Date   Acute metabolic encephalopathy 05/23/2022   viral URI   Blood in stool 2010   Colonoscopy benign repeat 2015   Difficulty walking 05/23/2022   Esophageal dysmotility    Essential hypertension 09/09/2015   Hardening of the aorta (main artery of the heart)    Hyperlipidemia 09/09/2015   Hyponatremia 05/23/2022   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   Esophageal dysmotility    Hardening of the aorta (main artery of the heart)    Acute metabolic encephalopathy 05/23/2022   Difficulty walking 05/23/2022   Hyponatremia 05/23/2022   Parkinson's disease (HCC) 03/17/2017   Essential hypertension 09/09/2015   Hyperlipidemia 09/09/2015      ONSET DATE: Approx 8 years ago, script dated 05-24-23  REFERRING DIAG: PD with dyskinesia  THERAPY DIAG:  Dysarthria and anarthria  Dysphagia, unspecified type  Cognitive communication deficit  Rationale for Evaluation and Treatment: Rehabilitation  SUBJECTIVE:   SUBJECTIVE STATEMENT:  "I was telling a story the other day and nobody leaned in."    Pt accompanied by: significant other Jeremy Davidson   PERTINENT HISTORY: From Jeremy Davidson note 03/08/23: Jeremy Davidson was seen today in follow up for Parkinsons disease.  My previous records were reviewed prior to todays visit as well as outside records available to me.  Wife supplements history.  We slightly increased his levodopa last visit.  He tolerated that well, without side effects.  Last visit, he was having a lot of back pain and referred him to physical therapy.  It looks like he only went twice and never went back.  We also referred him for speech therapy and he went 3 times and did not want to go back.  He states he wasn't sure it helped but wife does think it does and she would like another referral.  No lightheadedness/near syncope.  As far as he knows, BP is running good at home but he's not checking it.   DIAGNOSTIC FINDINGS: Neuropsych Eval 07/07/23: Clinical Impression(s): Jeremy Davidson pattern of performance is suggestive of fairly diffuse cognitive impairment. His greatest areas of impairment surrounded processing speed, attention/concentration, and all aspects of learning and memory. Further weakness/variability was exhibited across executive functioning, especially cognitive flexibility and response inhibition, as well as verbal fluency. Performances were appropriate relative to age-matched peers across abstract reasoning, safety/judgment, confrontation naming, and visuospatial abilities. Functionally, his wife has fully taken over medication management, financial management, and bill paying responsibilities.  He also recently decided to stop driving of his own volition due to perceived deficits surrounding reaction time and other cognitive abilities. Given evidence for cognitive impairment and the likelihood that it is directly interfering with day-to-day functioning, I believe that Jeremy Davidson best meets diagnostic criteria for a Major Neurocognitive Disorder ("dementia") at the present  time.   The most likely etiology for his fairly mild dementia presentation would appear to be his history of Parkinson's disease. His patterns of impairment across testing align well with what is expected with this sort of illness. There remains the potential that his bout of metabolic encephalopathy caused by an acute upper respiratory illness in August 2023 could be influencing his overall degree of functioning. However, presentations like this typically improve over time and do not exhibit progressive decline as described by both Jeremy Davidson and his wife. Despite concern surrounding visual hallucinations, current testing patterns are not particularly compelling with regard to Lewy body disease given relatively intact visuospatial abilities. Hallucinations may be better described as illusions. He was fairly vague when describing overall alcohol consumption and this could also be playing a relatively mild role in ongoing dysfunction. There is no recent neuroimaging as his most recent brain MRI appears to have been completed in 2018. As such, I cannot theorize about other potential anatomical contributions to ongoing dysfunction. Continued medical monitoring will be important moving forward.   PAIN:  Are you having pain? No  FALLS: Has patient fallen in last 6 months?  Yes, Number of falls: 1 "full fall", and two where pt caught himself before going to the floor  PATIENT GOALS: Improve communication effectiveness  OBJECTIVE:   PATIENT REPORTED OUTCOME MEASURES (PROM): Communication Effectiveness Survey: pt answered stimuli to score 21/32 (higher scores=more effective communication).  TODAY'S TREATMENT:                                                                                                                                         DATE:  07/26/23: MBS scheduled for 08/03/23. Pt and wife saw neuoropsych yesterday. Pt with diagnosis of mild dementia. Pt told SLP the sentence in "S". Pt was  thankful and pleased about this. Loud /a/ today average mid-upper 80s dB In word level responses, pt produced WNL speech 75% of the time, improved to 100% with verbal cue. With sentence level responses pt maintained WNL speech 70% of the time and improved to 90% with nonverbal cue (hand to ear). Cognitive load played a role here. Outdoors pt maintained conversation for 8 minutes with self correction when he was not loud enough - conversation 100% intelligible except after mowers started, pt req'd usual mod A for being at an intelligible volume.   07/19/23: Pt would like to pursue MBS. SLP to send order today. SLP explained to pt and Jeremy Davidson the reason for the MBS, and possible results/outcomes.   Today pt completed loud /a/ with  average of mid-upper 80s dB. SLP and pt went outdoors and today in simple conversation the patient exhibited WNL loudness with rare min cues for improving loudness. Wife states when she asks pt to incr loudness he can do so with one request; Prior to this pt was unable to improve loudness.   07/13/23: Pt will be asked about thoughts about MBS next session. Pt completed loud /a/ with average of mid-upper 80s dB. Today in simple conversation the patient exhibited WNL loudness with occasional cues for "shout", which were successful in pt incr'ing loudness 75% of the time.  SLP cont'd to encourage pt to practice at home every day with louder speech, and to complete loud /a/ BID as well. SLP reviewed some tips on successful communication today with pt and wife, including the fact that pt may cont to talk softly when requiring to multitask with verbal content and necessity to talk louder. Pt demonstrated understanding, as did wife.  07/11/23: Pt endorses rare throat clearing with liquids/meals. Looking back at a DG Esophagus that pt had, he silently aspirated dense barium, and referral was suggested for ST assessment at that time. Given this, SLP recommended MBS at this time. Pt/wife will  think about this and will get back to SLP with thought/s whether to go ahead with MBS or wait. Today SLP assisted pt with tasks to improve speech loudness. Loud /a/ averaged 86 dB. SLP then engaged pt about salient and pertinent topics for pt including hobbies. SLP req'd to provide min-mod cues x5 in 9 minutes conversation; Average dB = 68.  07/03/23: Wife states Croy's speech has been louder in the last 3 weeks, "I'm less annoying now than I used to be (asking him to repeat)." Jeremy Davidson) Throughout session SLP explained/educated wife on difficulties particularly with speech volume and spoken language generation, and also explained some compensations she may need to employ due to this. Today pt's /a/ req'd initial cues for extending production (pt started with 2-3 second productions). Jeremy Davidson stated pt /a/ productions are short like this at home sometimes. Averaged 90 dB with occasional cues for open mouth. SLP worked with pt with phrase and short sentence responses with occasional mod A for improving volume. Occasionally SLP would have to repeat stimulus due to incorrect response, SLP asked for another response, and pt would thus forget stimulus. SLP worked with pt and wife to get pt signed back in to [facility]. SLP suggested since pt has cataract sx tomorrow that he wait until his eyesight is recovered and then call company and request 14 day trial restart. He and wife agreeable with this.   06/29/23: Pt educated pt on [facility] and worked with him in downloading and setting up an account. SLP assisted pt occasionally at attention to detail (pushing "next", or looking at all options on the screen prior to hitting something incorrectly while setting up account). In the cognitive tasks, pt req'd mod A consistently for alternating attention. SLP to focus on speech intelligibility as pt will receive neuropsych eval 07-07-23.  06/27/23: SLP acknowledged pt's wife should attend therapy with pt  whenever possible. SLP  asked about pt's swallowing status again (first time asked was at eval), and pt mistakenly answered about his SPEAKING; SLP redirected Slater to answer SLP's question about swallowing and he denied noticing throat clearing and/or coughing with POs. SLP to cont to monitor. Pt states he is performing loud /a/ and practicing louder speech at home with responses worksheets "not as much as (is prescribed)  but I'm doing some." SLP strongly reiterated the need for consistent practice to enhance positive change in speech loudness.  SLP assisted pt with loud /a/, providing consistent mod cues for full breath and for loudness throughout the 10 reps. SLP could not fade cues, as pt req'd mod cues the entire task. SLP then had pt read 50 common conversational sentences. At first pt volume was in mid to upper 60s dB, and he req'd occasional min-mod A for loudness. On the third time through, pt became more consistent with loudness in mid 70s dB. SLP again strongly reiterated consistency with practicing louder speech with responses on worksheets for 20 minutes BID.   06/23/23: SLP reiterated the need for daily practice and provided rationale for this. Daughter tells SLP pt does not exhibit any difficulty with swallowing sx during meals, but wonders (from pt's wife) if there's anything that can be done to prevent swallowing issues. SLP suggested 10 effortful swallows BID. Pt tells SLP that he is using more abdominal musculature recruitment than before ST was initiated, and thus "S" statement. SLP shared about communication at drive [place] and communicating with wait staff are also good barometers for assessing if pt is speaking louder.  Today, pt's /a/ averaged 84dB, with occasional min A for loudness. Simple word level responses were  highly dependent upon cognitive load (naming one memober from a simple/common category) Occasional increased to usual min cues needed due to mental fatigue - average mid  -upper 60s dB. Answering simple questions re: seasons, months, holidays with one word answers req'd occasional mod cues -average was mid 60s dB. SLP provided homework and daughter stated she could assist pt as necessary with reading and answering.   06/20/23: SLP began by explaining rationale for loud /a/ for daughter, and explaining rationale for daily practice sessions at home. Suggested pt do 20-25 minutes BID, separate from loud /a/. During session SLP also explained that due to pt's cognition he may have more difficulty with mod complex conversation and staying at WNL volume.  In divergent naming task (concrete categories) pt req'd usual mod cues for loudness faded to occasional min-mod cues. By session end, pt began indicating when he did not maintain loudness throughout the utterance. SLP pointed this out to pt and daughter and used it as reinforcement for daily practice with /a/ and 20-25 minutes BID. Daughter typed during session as SLP explained things to pt (re-explained) and daughter.  06/13/23: Pt demo'd sx of reduced working memory as he req'd cues spelling 3-4 letter words backwards today. Pt may benefit from formal comm-cog evaluation.  Generated /a/ with average mid 80s dB with occasional mod A for loudness/strength. In phrase responses (pt unable to adequately read information due to cataracts (sx upcoming) pt req'd usual min-mod cues faded to occasional min-mod cues for strength and loudness. Pt faded loudness at end of phrases 50% of the time.  SLP administered PROM today with results above.   06/07/23: SLP shaped pt's /a/ today and explained rationale for loud /a/ (explained to pt as "strong /a/" to minimize vocal strain and laryngeal effort). SLP explained treatment options and pt chose a less-programmed option (a plan that would NOT include Spe[employer]!).   PATIENT EDUCATION: Education details: see "today's treatment" Person educated: Patient and daughter Education method:  Explanation, Demonstration, Verbal cues, and Handouts Education comprehension: verbalized understanding, returned demonstration, verbal cues required, and needs further education  HOME EXERCISE PROGRAM: 10 strong "ah" BID. Eventually, pt will complete glides and everyday sentences, when  and if clinically appropriate.   GOALS: Goals reviewed with patient? No  SHORT TERM GOALS: Target date: 07/13/23  pt will produce loud /a/ or "hey!" with at least upper 80s dB average over three sessions Baseline: 07/03/23 Goal status: Partially met  2.  Pt will produce 16/20 sentence responses with WNL volume over two sessions Baseline:  Goal status: not met  3.  Pt will produce 3 minutes simple conversation with WNL volume over three sessions Baseline:  Goal status: not met  4.  Pt will tell SLP benefit of brain-stimulating tasks (eg, "[facility]", "lumosity", sudoku, etc) Baseline:  Goal status: deferred to work on speech  5.  Pt will undergo bedside swallow assessment if clinically indicated Baseline:  Goal status: Deferred - pt to get back with SLP if MBS desired   LONG TERM GOALS: Target date: 08/12/23  Pt will undergo MBS if clinically indicated Baseline:  Goal status: Met  2.  Pt will produce 10 minutes simple-mod complex conversation with WNL volume given rare nonverbal cues over three sessions Baseline: 07/19/23 Goal status: INITIAL  3.  Pt's PROM will show improvement from initial administration Baseline:  Goal status: INITIAL  4.  Pt will report utilizing memory strategies to improve overall memory, in or between 3 sessions  Baseline:  Goal status: INITIAL  ASSESSMENT:  CLINICAL IMPRESSION: SLP inquired to referring MD about MBS on 07/19/23 - SLP sent script for cosign. Patient is a 72 y.o. M who was seen today for treatment of speech skills in light of his Parkinson disease diagnosed approx 8 years ago. Pt progress in ST continues highly dependent upon cognitive  load necessary to participate in dyad conversation/responses, and could be further complicated by pt's consistency with homework. See "today's treatment" for details. Pt without s/sx PNA this year. Pt had neurocognitive eval on 07-07-23 and follow up is 07/25/23, so no formal cognitive communication eval in ST will occur at this time.   OBJECTIVE IMPAIRMENTS: Objective impairments include attention, memory, dysarthria, and dysphagia. These impairments are limiting patient from managing medications, managing appointments, managing finances, household responsibilities, ADLs/IADLs, effectively communicating at home and in community, and safety when swallowing.Factors affecting potential to achieve goals and functional outcome are ability to learn/carryover information, cooperation/participation level, and previous level of function.. Patient will benefit from skilled SLP services to address above impairments and improve overall function.  REHAB POTENTIAL: Good  PLAN:  SLP FREQUENCY: 2x/week  SLP DURATION: 8 weeks  PLANNED INTERVENTIONS: Aspiration precaution training, Pharyngeal strengthening exercises, Diet toleration management , Environmental controls, Trials of upgraded texture/liquids, Cueing hierachy, Cognitive reorganization, Internal/external aids, Functional tasks, SLP instruction and feedback, Compensatory strategies, Patient/family education, and MBS (possible)    Leverne Amrhein, CCC-SLP 07/26/2023, 12:08 PM

## 2023-07-27 ENCOUNTER — Ambulatory Visit: Payer: Medicare HMO

## 2023-07-27 DIAGNOSIS — R69 Illness, unspecified: Secondary | ICD-10-CM | POA: Diagnosis not present

## 2023-07-31 ENCOUNTER — Ambulatory Visit: Payer: Medicare HMO

## 2023-08-01 ENCOUNTER — Ambulatory Visit: Payer: Medicare HMO

## 2023-08-01 DIAGNOSIS — H25012 Cortical age-related cataract, left eye: Secondary | ICD-10-CM | POA: Diagnosis not present

## 2023-08-01 DIAGNOSIS — H25812 Combined forms of age-related cataract, left eye: Secondary | ICD-10-CM | POA: Diagnosis not present

## 2023-08-01 DIAGNOSIS — Z961 Presence of intraocular lens: Secondary | ICD-10-CM | POA: Diagnosis not present

## 2023-08-01 DIAGNOSIS — H2512 Age-related nuclear cataract, left eye: Secondary | ICD-10-CM | POA: Diagnosis not present

## 2023-08-02 ENCOUNTER — Ambulatory Visit: Payer: Medicare HMO

## 2023-08-02 DIAGNOSIS — R471 Dysarthria and anarthria: Secondary | ICD-10-CM | POA: Diagnosis not present

## 2023-08-02 DIAGNOSIS — R41841 Cognitive communication deficit: Secondary | ICD-10-CM

## 2023-08-02 DIAGNOSIS — R1310 Dysphagia, unspecified: Secondary | ICD-10-CM

## 2023-08-02 NOTE — Therapy (Signed)
OUTPATIENT SPEECH LANGUAGE PATHOLOGY TREATMENT/   Patient Name: Jeremy Davidson MRN: [id number] DOB:06-22-1951, 72 y.o., male Today's Date: 08/02/2023  PCP: Jeremy Bussing, MD REFERRING PROVIDER: Darrow Bussing, MD   END OF SESSION:  End of Session - 08/02/23 1103     Visit Number 12    Number of Visits 17    Date for SLP Re-Evaluation 08/12/23    SLP Start Time 1020    SLP Stop Time  1100    SLP Time Calculation (min) 40 min    Activity Tolerance Patient tolerated treatment well                    Past Medical History:  Diagnosis Date   Acute metabolic encephalopathy 05/23/2022   viral URI   Blood in stool 2010   Colonoscopy benign repeat 2015   Difficulty walking 05/23/2022   Esophageal dysmotility    Essential hypertension 09/09/2015   Hardening of the aorta (main artery of the heart)    Hyperlipidemia 09/09/2015   Hyponatremia 05/23/2022   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   Esophageal dysmotility    Hardening of the aorta (main artery of the heart)    Acute metabolic encephalopathy 05/23/2022   Difficulty walking 05/23/2022   Hyponatremia 05/23/2022   Parkinson's disease (HCC) 03/17/2017   Essential hypertension 09/09/2015   Hyperlipidemia 09/09/2015      ONSET DATE: Approx 8 years ago, script dated 05-24-23  REFERRING DIAG: PD with dyskinesia  THERAPY DIAG:  Dysarthria and anarthria  Dysphagia, unspecified type  Cognitive communication deficit  Rationale for Evaluation and Treatment: Rehabilitation  SUBJECTIVE:   SUBJECTIVE STATEMENT:  Pt arrives speaking in mid-upper 60s dB.   Pt  accompanied by: significant other   PERTINENT HISTORY: From Dr. Don Davidson note 03/08/23: Jeremy Davidson was seen today in follow up for Parkinsons disease.  My previous records were reviewed prior to todays visit as well as outside records available to me.  Wife supplements history.  We slightly increased his levodopa last visit.  He tolerated that well, without side effects.  Last visit, he was having a lot of back pain and referred him to physical therapy.  It looks like he only went twice and never went back.  We also referred him for speech therapy and he went 3 times and did not want to go back.  He states he wasn't sure it helped but wife does think it does and she would like another referral.  No lightheadedness/near syncope.  As far as he knows, BP is running good at home but he's not checking it.   DIAGNOSTIC FINDINGS: Neuropsych Eval 07/07/23: Clinical Impression(s): Jeremy Davidson pattern of performance is suggestive of fairly diffuse cognitive impairment. His greatest areas of impairment surrounded processing speed, attention/concentration, and all aspects of learning and memory. Further weakness/variability was exhibited across executive functioning, especially cognitive flexibility and response inhibition, as well as verbal fluency. Performances were appropriate relative to age-matched peers across abstract reasoning, safety/judgment, confrontation naming, and visuospatial abilities. Functionally, his wife has fully taken over medication management, financial management, and bill paying responsibilities. He also recently decided to  stop driving of his own volition due to perceived deficits surrounding reaction time and other cognitive abilities. Given evidence for cognitive impairment and the likelihood that it is directly interfering with day-to-day functioning, I believe that Jeremy Davidson best meets diagnostic criteria for a Major Neurocognitive Disorder ("dementia") at the present time.   The  most likely etiology for his fairly mild dementia presentation would appear to be his history of Parkinson's disease. His patterns of impairment across testing align well with what is expected with this sort of illness. There remains the potential that his bout of metabolic encephalopathy caused by an acute upper respiratory illness in August 2023 could be influencing his overall degree of functioning. However, presentations like this typically improve over time and do not exhibit progressive decline as described by both Jeremy Davidson and his wife. Despite concern surrounding visual hallucinations, current testing patterns are not particularly compelling with regard to Lewy body disease given relatively intact visuospatial abilities. Hallucinations may be better described as illusions. He was fairly vague when describing overall alcohol consumption and this could also be playing a relatively mild role in ongoing dysfunction. There is no recent neuroimaging as his most recent brain MRI appears to have been completed in 2018. As such, I cannot theorize about other potential anatomical contributions to ongoing dysfunction. Continued medical monitoring will be important moving forward.   PAIN:  Are you having pain? No  FALLS: Has patient fallen in last 6 months?  Yes, Number of falls: 1 "full fall", and two where pt caught himself before going to the floor  PATIENT GOALS: Improve communication effectiveness  OBJECTIVE:   PATIENT REPORTED OUTCOME MEASURES (PROM): Communication Effectiveness Survey: pt answered stimuli to score 21/32 (higher scores=more effective communication).  TODAY'S TREATMENT:                                                                                                                                         DATE:  08/02/23: MBS tomorrow. Loud /a/ today average upper 80s dB With sentence level responses pt maintained WNL speech 70% of the time and improved to 90% with nonverbal cue  (hand to ear). Pt success depended heavily on cognitive load. Conversation between tasks was WNL 20% of the time, however pt was 100% intelligible with min-mod background noise.   07/26/23: MBS scheduled for 08/03/23. Pt and wife saw neuoropsych yesterday. Pt with diagnosis of mild dementia. Pt told SLP the sentence in "S". Pt was thankful and pleased about this. Loud /a/ today average mid-upper 80s dB In word level responses, pt produced WNL speech 75% of the time, improved to 100% with verbal cue. With sentence level responses pt maintained WNL speech 70% of the time and improved to 90% with nonverbal cue (hand to ear). Cognitive load played a role here. Outdoors pt maintained conversation for 8 minutes with self correction when he was not loud enough -  conversation 100% intelligible except after mowers started, pt req'd usual mod A for being at an intelligible volume.   07/19/23: Pt would like to pursue MBS. SLP to send order today. SLP explained to pt and [name] the reason for the MBS, and possible results/outcomes.   Today pt completed loud /a/ with average of mid-upper 80s dB. SLP and pt went outdoors and today in simple conversation the patient exhibited WNL loudness with rare min cues for improving loudness. Wife states when she asks pt to incr loudness he can do so with one request; Prior to this pt was unable to improve loudness.   07/13/23: Pt will be asked about thoughts about MBS next session. Pt completed loud /a/ with average of mid-upper 80s dB. Today in simple conversation the patient exhibited WNL loudness with occasional cues for "shout", which were successful in pt incr'ing loudness 75% of the time.  SLP cont'd to encourage pt to practice at home every day with louder speech, and to complete loud /a/ BID as well. SLP reviewed some tips on successful communication today with pt and wife, including the fact that pt may cont to talk softly when requiring to multitask with verbal content and  necessity to talk louder. Pt demonstrated understanding, as did wife.  07/11/23: Pt endorses rare throat clearing with liquids/meals. Looking back at a DG Esophagus that pt had, he silently aspirated dense barium, and referral was suggested for ST assessment at that time. Given this, SLP recommended MBS at this time. Pt/wife will think about this and will get back to SLP with thought/s whether to go ahead with MBS or wait. Today SLP assisted pt with tasks to improve speech loudness. Loud /a/ averaged 86 dB. SLP then engaged pt about salient and pertinent topics for pt including hobbies. SLP req'd to provide min-mod cues x5 in 9 minutes conversation; Average dB = 68.  07/03/23: Wife states Rubens's speech has been louder in the last 3 weeks, "I'm less annoying now than I used to be (asking him to repeat)." [name]) Throughout session SLP explained/educated wife on difficulties particularly with speech volume and spoken language generation, and also explained some compensations she may need to employ due to this. Today pt's /a/ req'd initial cues for extending production (pt started with 2-3 second productions). [name] stated pt /a/ productions are short like this at home sometimes. Averaged 90 dB with occasional cues for open mouth. SLP worked with pt with phrase and short sentence responses with occasional mod A for improving volume. Occasionally SLP would have to repeat stimulus due to incorrect response, SLP asked for another response, and pt would thus forget stimulus. SLP worked with pt and wife to get pt signed back in to [facility]. SLP suggested since pt has cataract sx tomorrow that he wait until his eyesight is recovered and then call company and request 14 day trial restart. He and wife agreeable with this.   06/29/23: Pt educated pt on [facility] and worked with him in downloading and setting up an account. SLP assisted pt occasionally at attention to detail (pushing "next", or looking  at all options on the screen prior to hitting something incorrectly while setting up account). In the cognitive tasks, pt req'd mod A consistently for alternating attention. SLP to focus on speech intelligibility as pt will receive neuropsych eval 07-07-23.  06/27/23: SLP acknowledged pt's wife should attend therapy with pt whenever possible. SLP  asked about pt's swallowing status again (first time asked was at  eval), and pt mistakenly answered about his SPEAKING; SLP redirected Glennon to answer SLP's question about swallowing and he denied noticing throat clearing and/or coughing with POs. SLP to cont to monitor. Pt states he is performing loud /a/ and practicing louder speech at home with responses worksheets "not as much as (is prescribed) but I'm doing some." SLP strongly reiterated the need for consistent practice to enhance positive change in speech loudness.  SLP assisted pt with loud /a/, providing consistent mod cues for full breath and for loudness throughout the 10 reps. SLP could not fade cues, as pt req'd mod cues the entire task. SLP then had pt read 50 common conversational sentences. At first pt volume was in mid to upper 60s dB, and he req'd occasional min-mod A for loudness. On the third time through, pt became more consistent with loudness in mid 70s dB. SLP again strongly reiterated consistency with practicing louder speech with responses on worksheets for 20 minutes BID.   06/23/23: SLP reiterated the need for daily practice and provided rationale for this. Daughter tells SLP pt does not exhibit any difficulty with swallowing sx during meals, but wonders (from pt's wife) if there's anything that can be done to prevent swallowing issues. SLP suggested 10 effortful swallows BID. Pt tells SLP that he is using more abdominal musculature recruitment than before ST was initiated, and thus "S" statement. SLP shared about communication at drive thr[place] and communicating with wait staff are also  good barometers for assessing if pt is speaking louder.  Today, pt's /a/ averaged 84dB, with occasional min A for loudness. Simple word level responses were  highly dependent upon cognitive load (naming one memober from a simple/common category) Occasional increased to usual min cues needed due to mental fatigue - average mid -upper 60s dB. Answering simple questions re: seasons, months, holidays with one word answers req'd occasional mod cues -average was mid 60s dB. SLP provided homework and daughter stated she could assist pt as necessary with reading and answering.   06/20/23: SLP began by explaining rationale for loud /a/ for daughter, and explaining rationale for daily practice sessions at home. Suggested pt do 20-25 minutes BID, separate from loud /a/. During session SLP also explained that due to pt's cognition he may have more difficulty with mod complex conversation and staying at WNL volume.  In divergent naming task (concrete categories) pt req'd usual mod cues for loudness faded to occasional min-mod cues. By session end, pt began indicating when he did not maintain loudness throughout the utterance. SLP pointed this out to pt and daughter and used it as reinforcement for daily practice with /a/ and 20-25 minutes BID. Daughter typed during session as SLP explained things to pt (re-explained) and daughter.  06/13/23: Pt demo'd sx of reduced working memory as he req'd cues spelling 3-4 letter words backwards today. Pt may benefit from formal comm-cog evaluation.  Generated /a/ with average mid 80s dB with occasional mod A for loudness/strength. In phrase responses (pt unable to adequately read information due to cataracts (sx upcoming) pt req'd usual min-mod cues faded to occasional min-mod cues for strength and loudness. Pt faded loudness at end of phrases 50% of the time.  SLP administered PROM today with results above.   06/07/23: SLP shaped pt's /a/ today and explained rationale for loud /a/  (explained to pt as "strong /a/" to minimize vocal strain and laryngeal effort). SLP explained treatment options and pt chose a less-programmed option (a plan that would NOT  include [employer]!).   PATIENT EDUCATION: Education details: see "today's treatment" Person educated: Patient and daughter Education method: Explanation, Demonstration, Verbal cues, and Handouts Education comprehension: verbalized understanding, returned demonstration, verbal cues required, and needs further education  HOME EXERCISE PROGRAM: 10 strong "ah" BID. Eventually, pt will complete glides and everyday sentences, when and if clinically appropriate.   GOALS: Goals reviewed with patient? No  SHORT TERM GOALS: Target date: 07/13/23  pt will produce loud /a/ or "hey!" with at least upper 80s dB average over three sessions Baseline: 07/03/23 Goal status: Partially met  2.  Pt will produce 16/20 sentence responses with WNL volume over two sessions Baseline:  Goal status: not met  3.  Pt will produce 3 minutes simple conversation with WNL volume over three sessions Baseline:  Goal status: not met  4.  Pt will tell SLP benefit of brain-stimulating tasks (eg, "[facility]", "lumosity", sudoku, etc) Baseline:  Goal status: deferred to work on speech  5.  Pt will undergo bedside swallow assessment if clinically indicated Baseline:  Goal status: Deferred - pt to get back with SLP if MBS desired   LONG TERM GOALS: Target date: 08/12/23  Pt will undergo MBS if clinically indicated Baseline:  Goal status: Met  2.  Pt will produce 10 minutes simple-mod complex conversation with WNL volume given rare nonverbal cues over three sessions Baseline: 07/19/23 Goal status: INITIAL  3.  Pt's PROM will show improvement from initial administration Baseline:  Goal status: INITIAL  4.  Pt will report utilizing memory strategies to improve overall memory, in or between 3 sessions  Baseline:  Goal status:  INITIAL  ASSESSMENT:  CLINICAL IMPRESSION: MBS tomorrow. Patient is a 72 y.o. M who was seen today for treatment of speech skills in light of his Parkinson disease diagnosed approx 8 years ago. Pt progress in ST continues highly dependent upon cognitive load necessary to participate in dyad conversation/responses, and could be further complicated by pt's consistency with homework. See "today's treatment" for details. Pt without s/sx PNA this year. Pt had neurocognitive eval on 07-07-23 and follow up is 07/25/23, so no formal cognitive communication eval in ST will occur at this time.   OBJECTIVE IMPAIRMENTS: Objective impairments include attention, memory, dysarthria, and dysphagia. These impairments are limiting patient from managing medications, managing appointments, managing finances, household responsibilities, ADLs/IADLs, effectively communicating at home and in community, and safety when swallowing.Factors affecting potential to achieve goals and functional outcome are ability to learn/carryover information, cooperation/participation level, and previous level of function.. Patient will benefit from skilled SLP services to address above impairments and improve overall function.  REHAB POTENTIAL: Good  PLAN:  SLP FREQUENCY: 2x/week  SLP DURATION: 8 weeks  PLANNED INTERVENTIONS: Aspiration precaution training, Pharyngeal strengthening exercises, Diet toleration management , Environmental controls, Trials of upgraded texture/liquids, Cueing hierachy, Cognitive reorganization, Internal/external aids, Functional tasks, SLP instruction and feedback, Compensatory strategies, Patient/family education, and MBS (possible)    Heinz Eckert, CCC-SLP 08/02/2023, 11:03 AM

## 2023-08-03 ENCOUNTER — Ambulatory Visit (HOSPITAL_COMMUNITY)
Admission: RE | Admit: 2023-08-03 | Discharge: 2023-08-03 | Disposition: A | Discharge status: Home / Self Care | Payer: Medicare HMO | Source: Ambulatory Visit | Attending: Family Medicine | Admitting: Family Medicine

## 2023-08-03 DIAGNOSIS — E785 Hyperlipidemia, unspecified: Secondary | ICD-10-CM | POA: Insufficient documentation

## 2023-08-03 DIAGNOSIS — R1310 Dysphagia, unspecified: Secondary | ICD-10-CM | POA: Insufficient documentation

## 2023-08-03 DIAGNOSIS — Z87891 Personal history of nicotine dependence: Secondary | ICD-10-CM | POA: Insufficient documentation

## 2023-08-03 DIAGNOSIS — F02A0 Dementia in other diseases classified elsewhere, mild, without behavioral disturbance, psychotic disturbance, mood disturbance, and anxiety: Secondary | ICD-10-CM | POA: Insufficient documentation

## 2023-08-03 DIAGNOSIS — R471 Dysarthria and anarthria: Secondary | ICD-10-CM | POA: Diagnosis not present

## 2023-08-03 DIAGNOSIS — G20A1 Parkinson's disease without dyskinesia, without mention of fluctuations: Secondary | ICD-10-CM | POA: Insufficient documentation

## 2023-08-03 DIAGNOSIS — R41841 Cognitive communication deficit: Secondary | ICD-10-CM | POA: Diagnosis not present

## 2023-08-04 ENCOUNTER — Ambulatory Visit: Payer: Medicare HMO

## 2023-08-04 DIAGNOSIS — R41841 Cognitive communication deficit: Secondary | ICD-10-CM | POA: Diagnosis not present

## 2023-08-04 DIAGNOSIS — R471 Dysarthria and anarthria: Secondary | ICD-10-CM | POA: Diagnosis not present

## 2023-08-04 DIAGNOSIS — R1310 Dysphagia, unspecified: Secondary | ICD-10-CM

## 2023-08-04 NOTE — Therapy (Signed)
OUTPATIENT SPEECH LANGUAGE PATHOLOGY TREATMENT/   Patient Name: Jeremy Davidson MRN: [id number] DOB:10/16/1951, 72 y.o., male Today's Date: 08/04/2023  PCP: Darrow Bussing, MD REFERRING PROVIDER: Darrow Bussing, MD   END OF SESSION:  End of Session - 08/04/23 1107     Visit Number 13    Number of Visits 17    Date for SLP Re-Evaluation 08/12/23    SLP Start Time 1105    SLP Stop Time  1145    SLP Time Calculation (min) 40 min    Activity Tolerance Patient tolerated treatment well                    Past Medical History:  Diagnosis Date   Acute metabolic encephalopathy 05/23/2022   viral URI   Blood in stool 2010   Colonoscopy benign repeat 2015   Difficulty walking 05/23/2022   Esophageal dysmotility    Essential hypertension 09/09/2015   Hardening of the aorta (main artery of the heart)    Hyperlipidemia 09/09/2015   Hyponatremia 05/23/2022   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   Esophageal dysmotility    Hardening of the aorta (main artery of the heart)    Acute metabolic encephalopathy 05/23/2022   Difficulty walking 05/23/2022   Hyponatremia 05/23/2022   Parkinson's disease (HCC) 03/17/2017   Essential hypertension 09/09/2015   Hyperlipidemia 09/09/2015      ONSET DATE: Approx 8 years ago, script dated 05-24-23  REFERRING DIAG: PD with dyskinesia  THERAPY DIAG:  Dysarthria and anarthria  Dysphagia, unspecified type  Cognitive communication deficit  Rationale for Evaluation and Treatment: Rehabilitation  SUBJECTIVE:   SUBJECTIVE STATEMENT:  "I feel good today. Definitely speaking louder. Someone  asked me yesterday at boxing why I was shouting."   Pt accompanied by: significant other   PERTINENT HISTORY: From Dr. Don Davidson note 03/08/23: Jeremy Davidson was seen today in follow up for Parkinsons disease.  My previous records were reviewed prior to todays visit as well as outside records available to me.  Wife supplements history.  We slightly increased his levodopa last visit.  He tolerated that well, without side effects.  Last visit, he was having a lot of back pain and referred him to physical therapy.  It looks like he only went twice and never went back.  We also referred him for speech therapy and he went 3 times and did not want to go back.  He states he wasn't sure it helped but wife does think it does and she would like another referral.  No lightheadedness/near syncope.  As far as he knows, BP is running good at home but he's not checking it.   DIAGNOSTIC FINDINGS: MBS 08/03/23: Clinical Impression: Clinical Impression: Patient presents with functional oropharygeal swallow ability without aspiration of any consistency tested including thin, nectar, honey, pudding, cracker and tablet.  His swallow function is characterized by mildly decreased tongue base retraction and  laryngeal closure/elevation allowing minimal pharyngeal retention and trace penetration of thin liquids x2 during testing.  Penetration noted during the swallow with first tsp bolus as well as later x1.  Penetrates largely cleared with furhter  swallowing.  Noted pt slight neck extension improved barium containment in pharynx *bypassing slightly open airway* - pt noted to extend neck when swallowing barium tablet with thin liquids. Slight neck extension position may be helpful in the future should pt have issues with swallowing liquids.  Recommend continue diet with precautions - and consider RMST as well as laryngeal elevation/closure exercises given pt's diagnosis of Parkinsons.  Educated pt and his wife to  findings/recommendations. Thanks for this referral.   Neuropsych Eval 07/07/23: Clinical Impression(s): Jeremy Davidson pattern of performance is suggestive of fairly diffuse cognitive impairment. His greatest areas of impairment surrounded processing speed, attention/concentration, and all aspects of learning and memory. Further weakness/variability was exhibited across executive functioning, especially cognitive flexibility and response inhibition, as well as verbal fluency. Performances were appropriate relative to age-matched peers across abstract reasoning, safety/judgment, confrontation naming, and visuospatial abilities. Functionally, his wife has fully taken over medication management, financial management, and bill paying responsibilities. He also recently decided to stop driving of his own volition due to perceived deficits surrounding reaction time and other cognitive abilities. Given evidence for cognitive impairment and the likelihood that it is directly interfering with day-to-day functioning, I believe that Jeremy Davidson best meets diagnostic criteria for a Major Neurocognitive Disorder ("dementia") at the present time.   The most likely etiology for his fairly mild dementia presentation would appear to be his history of Parkinson's disease. His patterns of impairment across testing align well with what is expected with this sort of illness. There remains the potential that his bout of metabolic encephalopathy caused by an acute upper respiratory illness in August 2023 could be influencing his overall degree of functioning. However, presentations like this typically improve over time and do not exhibit progressive decline as described by both Jeremy Davidson and his wife. Despite concern surrounding visual hallucinations, current testing patterns are not particularly compelling with regard to Lewy body disease given relatively intact visuospatial abilities. Hallucinations may be better described as  illusions. He was fairly vague when describing overall alcohol consumption and this could also be playing a relatively mild role in ongoing dysfunction. There is no recent neuroimaging as his most recent brain MRI appears to have been completed in 2018. As such, I cannot theorize about other potential anatomical contributions to ongoing dysfunction. Continued medical monitoring will be important moving forward.   PAIN:  Are you having pain? No  FALLS: Has patient fallen in last 6 months?  Yes, Number of falls: 1 "full fall", and two where pt caught himself before going to the floor  PATIENT GOALS: Improve communication effectiveness  OBJECTIVE:   PATIENT REPORTED OUTCOME MEASURES (PROM): Communication Effectiveness Survey: pt answered stimuli to score 21/32 (higher scores=more effective communication).  TODAY'S TREATMENT:  DATE:  08/04/23: SWALLOWING: MBS results as above in "diagnostic findings". Today SLP reviewed MBS results, recommendations. SLP to provide HEP next session; Masako, Mendelsohn, Supraglottic. SPEECH: Loud /a/ today average mid-upper 80s dB. Pt maintained WNL speech in min complex verbal cognition sentence response tasks 70% of the time and improved to 85% with a verbal cue. Pt success depended heavily on cognitive load. Pt cont'd 100% intelligible with min-mod background noise. Wife agrees pt, even with more cognitve load is louder than he was prior to ST.   08/02/23: MBS tomorrow. Loud /a/ today average upper 80s dB With sentence level responses pt maintained WNL speech 70% of the time and improved to 90% with nonverbal cue (hand to ear). Pt success depended heavily on cognitive load. Conversation between tasks was WNL 20% of the time, however pt was 100% intelligible with min-mod background noise.   07/26/23: MBS scheduled for 08/03/23. Pt  and wife saw neuoropsych yesterday. Pt with diagnosis of mild dementia. Pt told SLP the sentence in "S". Pt was thankful and pleased about this. Loud /a/ today average mid-upper 80s dB In word level responses, pt produced WNL speech 75% of the time, improved to 100% with verbal cue. With sentence level responses pt maintained WNL speech 70% of the time and improved to 90% with nonverbal cue (hand to ear). Cognitive load played a role here. Outdoors pt maintained conversation for 8 minutes with self correction when he was not loud enough - conversation 100% intelligible except after mowers started, pt req'd usual mod A for being at an intelligible volume.   07/19/23: Pt would like to pursue MBS. SLP to send order today. SLP explained to pt and [name] the reason for the MBS, and possible results/outcomes.   Today pt completed loud /a/ with average of mid-upper 80s dB. SLP and pt went outdoors and today in simple conversation the patient exhibited WNL loudness with rare min cues for improving loudness. Wife states when she asks pt to incr loudness he can do so with one request; Prior to this pt was unable to improve loudness.   07/13/23: Pt will be asked about thoughts about MBS next session. Pt completed loud /a/ with average of mid-upper 80s dB. Today in simple conversation the patient exhibited WNL loudness with occasional cues for "shout", which were successful in pt incr'ing loudness 75% of the time.  SLP cont'd to encourage pt to practice at home every day with louder speech, and to complete loud /a/ BID as well. SLP reviewed some tips on successful communication today with pt and wife, including the fact that pt may cont to talk softly when requiring to multitask with verbal content and necessity to talk louder. Pt demonstrated understanding, as did wife.  07/11/23: Pt endorses rare throat clearing with liquids/meals. Looking back at a DG Esophagus that pt had, he silently aspirated dense barium, and  referral was suggested for ST assessment at that time. Given this, SLP recommended MBS at this time. Pt/wife will think about this and will get back to SLP with thought/s whether to go ahead with MBS or wait. Today SLP assisted pt with tasks to improve speech loudness. Loud /a/ averaged 86 dB. SLP then engaged pt about salient and pertinent topics for pt including hobbies. SLP req'd to provide min-mod cues x5 in 9 minutes conversation; Average dB = 68.  07/03/23: Wife states Duffy's speech has been louder in the last 3 weeks, "I'm less annoying now than I used to be (asking him  to repeat)." [name]) Throughout session SLP explained/educated wife on difficulties particularly with speech volume and spoken language generation, and also explained some compensations she may need to employ due to this. Today pt's /a/ req'd initial cues for extending production (pt started with 2-3 second productions). [name] stated pt /a/ productions are short like this at home sometimes. Averaged 90 dB with occasional cues for open mouth. SLP worked with pt with phrase and short sentence responses with occasional mod A for improving volume. Occasionally SLP would have to repeat stimulus due to incorrect response, SLP asked for another response, and pt would thus forget stimulus. SLP worked with pt and wife to get pt signed back in to [facility]. SLP suggested since pt has cataract sx tomorrow that he wait until his eyesight is recovered and then call company and request 14 day trial restart. He and wife agreeable with this.   06/29/23: Pt educated pt on [facility] and worked with him in downloading and setting up an account. SLP assisted pt occasionally at attention to detail (pushing "next", or looking at all options on the screen prior to hitting something incorrectly while setting up account). In the cognitive tasks, pt req'd mod A consistently for alternating attention. SLP to focus on speech intelligibility as pt  will receive neuropsych eval 07-07-23.  06/27/23: SLP acknowledged pt's wife should attend therapy with pt whenever possible. SLP  asked about pt's swallowing status again (first time asked was at eval), and pt mistakenly answered about his SPEAKING; SLP redirected Keiondre to answer SLP's question about swallowing and he denied noticing throat clearing and/or coughing with POs. SLP to cont to monitor. Pt states he is performing loud /a/ and practicing louder speech at home with responses worksheets "not as much as (is prescribed) but I'm doing some." SLP strongly reiterated the need for consistent practice to enhance positive change in speech loudness.  SLP assisted pt with loud /a/, providing consistent mod cues for full breath and for loudness throughout the 10 reps. SLP could not fade cues, as pt req'd mod cues the entire task. SLP then had pt read 50 common conversational sentences. At first pt volume was in mid to upper 60s dB, and he req'd occasional min-mod A for loudness. On the third time through, pt became more consistent with loudness in mid 70s dB. SLP again strongly reiterated consistency with practicing louder speech with responses on worksheets for 20 minutes BID.   06/23/23: SLP reiterated the need for daily practice and provided rationale for this. Daughter tells SLP pt does not exhibit any difficulty with swallowing sx during meals, but wonders (from pt's wife) if there's anything that can be done to prevent swallowing issues. SLP suggested 10 effortful swallows BID. Pt tells SLP that he is using more abdominal musculature recruitment than before ST was initiated, and thus "S" statement. SLP shared about communication at drive thr[place] and communicating with wait staff are also good barometers for assessing if pt is speaking louder.  Today, pt's /a/ averaged 84dB, with occasional min A for loudness. Simple word level responses were  highly dependent upon cognitive load (naming one memober from a  simple/common category) Occasional increased to usual min cues needed due to mental fatigue - average mid -upper 60s dB. Answering simple questions re: seasons, months, holidays with one word answers req'd occasional mod cues -average was mid 60s dB. SLP provided homework and daughter stated she could assist pt as necessary with reading and answering.   06/20/23:  SLP began by explaining rationale for loud /a/ for daughter, and explaining rationale for daily practice sessions at home. Suggested pt do 20-25 minutes BID, separate from loud /a/. During session SLP also explained that due to pt's cognition he may have more difficulty with mod complex conversation and staying at WNL volume.  In divergent naming task (concrete categories) pt req'd usual mod cues for loudness faded to occasional min-mod cues. By session end, pt began indicating when he did not maintain loudness throughout the utterance. SLP pointed this out to pt and daughter and used it as reinforcement for daily practice with /a/ and 20-25 minutes BID. Daughter typed during session as SLP explained things to pt (re-explained) and daughter.  06/13/23: Pt demo'd sx of reduced working memory as he req'd cues spelling 3-4 letter words backwards today. Pt may benefit from formal comm-cog evaluation.  Generated /a/ with average mid 80s dB with occasional mod A for loudness/strength. In phrase responses (pt unable to adequately read information due to cataracts (sx upcoming) pt req'd usual min-mod cues faded to occasional min-mod cues for strength and loudness. Pt faded loudness at end of phrases 50% of the time.  SLP administered PROM today with results above.   06/07/23: SLP shaped pt's /a/ today and explained rationale for loud /a/ (explained to pt as "strong /a/" to minimize vocal strain and laryngeal effort). SLP explained treatment options and pt chose a less-programmed option (a plan that would NOT include SpeakOut!).   PATIENT  EDUCATION: Education details: see "today's treatment" Person educated: Patient and daughter Education method: Explanation, Demonstration, Verbal cues, and Handouts Education comprehension: verbalized understanding, returned demonstration, verbal cues required, and needs further education  HOME EXERCISE PROGRAM: 10 strong "ah" BID. Eventually, pt will complete glides and everyday sentences, when and if clinically appropriate.   GOALS: Goals reviewed with patient? No  SHORT TERM GOALS: Target date: 07/13/23  pt will produce loud /a/ or "hey!" with at least upper 80s dB average over three sessions Baseline: 07/03/23 Goal status: Partially met  2.  Pt will produce 16/20 sentence responses with WNL volume over two sessions Baseline:  Goal status: not met  3.  Pt will produce 3 minutes simple conversation with WNL volume over three sessions Baseline:  Goal status: not met  4.  Pt will tell SLP benefit of brain-stimulating tasks (eg, "[facility]", "lumosity", sudoku, etc) Baseline:  Goal status: deferred to work on speech  5.  Pt will undergo bedside swallow assessment if clinically indicated Baseline:  Goal status: Deferred - pt to get back with SLP if MBS desired   LONG TERM GOALS: Target date: 08/12/23  Pt will undergo MBS if clinically indicated Baseline:  Goal status: Met  2.  Pt will produce 10 minutes simple-mod complex conversation with WNL volume given rare nonverbal cues over three sessions Baseline: 07/19/23 Goal status: INITIAL  3.  Pt's PROM will show improvement from initial administration Baseline:  Goal status: INITIAL  4.  Pt will report utilizing memory strategies to improve overall memory, in or between 3 sessions  Baseline:  Goal status: INITIAL  ASSESSMENT:  CLINICAL IMPRESSION: MBS  results above, needs Masako, Mendelsohn, and Supraglottic next session, for 8 weeks at 5-6 days/week, then x2/week after that. Patient is a 72 y.o. M who was seen  today for treatment of speech skills in light of his Parkinson disease diagnosed approx 8 years ago. Pt progress in ST continues highly dependent upon cognitive load necessary to participate in dyad  conversation/responses, and could be further complicated by pt's consistency with homework. See "today's treatment" for details. Pt without s/sx PNA this year. Pt had neurocognitive eval on 07-07-23 and follow up is 07/25/23, so no formal cognitive communication eval in ST will occur at this time.   OBJECTIVE IMPAIRMENTS: Objective impairments include attention, memory, dysarthria, and dysphagia. These impairments are limiting patient from managing medications, managing appointments, managing finances, household responsibilities, ADLs/IADLs, effectively communicating at home and in community, and safety when swallowing.Factors affecting potential to achieve goals and functional outcome are ability to learn/carryover information, cooperation/participation level, and previous level of function.. Patient will benefit from skilled SLP services to address above impairments and improve overall function.  REHAB POTENTIAL: Good  PLAN:  SLP FREQUENCY: 2x/week  SLP DURATION: 8 weeks  PLANNED INTERVENTIONS: Aspiration precaution training, Pharyngeal strengthening exercises, Diet toleration management , Environmental controls, Trials of upgraded texture/liquids, Cueing hierachy, Cognitive reorganization, Internal/external aids, Functional tasks, SLP instruction and feedback, Compensatory strategies, Patient/family education, and MBS (possible)    Shawn Carattini, CCC-SLP 08/04/2023, 11:07 AM

## 2023-08-08 ENCOUNTER — Ambulatory Visit: Payer: Medicare HMO

## 2023-08-08 DIAGNOSIS — R471 Dysarthria and anarthria: Secondary | ICD-10-CM | POA: Diagnosis not present

## 2023-08-08 DIAGNOSIS — R41841 Cognitive communication deficit: Secondary | ICD-10-CM

## 2023-08-08 DIAGNOSIS — R1310 Dysphagia, unspecified: Secondary | ICD-10-CM | POA: Diagnosis not present

## 2023-08-08 NOTE — Patient Instructions (Addendum)
   SWALLOWING EXERCISES Do these at least 5 days a week for 8 weeks, then 2 times per week afterwards You can use a wet spoon to swallow something, if your mouth gets dry  Masako Swallow - swallow with your tongue sticking out - Stick tongue out past your lips and gently bite tongue with your teeth - Swallow, while holding your tongue with your teeth - Do at least 25 reps/day, no less than 5-10 at one time   [facility] - "squeeze swallow" exercise - Swallow, and squeeze tight to keep your Adam's Apple up - Hold the squeeze for 5-7 seconds - then relax - Do at least 25 reps/day, no less than 5-10 at one time        3.  "Super Swallow"  - Take a breath and hold it  - Swallow then IMMEDIATELY cough  - Do at least 25 reps/day, no less than 5-10 at one time

## 2023-08-08 NOTE — Therapy (Signed)
OUTPATIENT SPEECH LANGUAGE PATHOLOGY TREATMENT/   Patient Name: Jeremy Davidson MRN: [id number] DOB:1951-09-08, 72 y.o., male Today's Date: 08/08/2023  PCP: Darrow Bussing, MD REFERRING PROVIDER: Darrow Bussing, MD   END OF SESSION:  End of Session - 08/08/23 1107     Visit Number 14    Number of Visits 17    Date for SLP Re-Evaluation 08/12/23    SLP Start Time 1105    SLP Stop Time  1145    SLP Time Calculation (min) 40 min    Activity Tolerance Patient tolerated treatment well                    Past Medical History:  Diagnosis Date   Acute metabolic encephalopathy 05/23/2022   viral URI   Blood in stool 2010   Colonoscopy benign repeat 2015   Difficulty walking 05/23/2022   Esophageal dysmotility    Essential hypertension 09/09/2015   Hardening of the aorta (main artery of the heart)    Hyperlipidemia 09/09/2015   Hyponatremia 05/23/2022   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   Esophageal dysmotility    Hardening of the aorta (main artery of the heart)    Acute metabolic encephalopathy 05/23/2022   Difficulty walking 05/23/2022   Hyponatremia 05/23/2022   Parkinson's disease (HCC) 03/17/2017   Essential hypertension 09/09/2015   Hyperlipidemia 09/09/2015      ONSET DATE: Approx 8 years ago, script dated 05-24-23  REFERRING DIAG: PD with dyskinesia  THERAPY DIAG:  Dysarthria and anarthria  Dysphagia, unspecified type  Cognitive communication deficit  Rationale for Evaluation and Treatment: Rehabilitation  SUBJECTIVE:   SUBJECTIVE STATEMENT:  "I feel good today. Definitely speaking louder. Someone  asked me yesterday at boxing why I was shouting."   Pt accompanied by: significant other   PERTINENT HISTORY: From Dr. Don Perking note 03/08/23: Jeremy Davidson was seen today in follow up for Parkinsons disease.  My previous records were reviewed prior to todays visit as well as outside records available to me.  Wife supplements history.  We slightly increased his levodopa last visit.  He tolerated that well, without side effects.  Last visit, he was having a lot of back pain and referred him to physical therapy.  It looks like he only went twice and never went back.  We also referred him for speech therapy and he went 3 times and did not want to go back.  He states he wasn't sure it helped but wife does think it does and she would like another referral.  No lightheadedness/near syncope.  As far as he knows, BP is running good at home but he's not checking it.   DIAGNOSTIC FINDINGS: MBS 08/03/23: Clinical Impression: Clinical Impression: Patient presents with functional oropharygeal swallow ability without aspiration of any consistency tested including thin, nectar, honey, pudding, cracker and tablet.  His swallow function is characterized by mildly decreased tongue base retraction and  laryngeal closure/elevation allowing minimal pharyngeal retention and trace penetration of thin liquids x2 during testing.  Penetration noted during the swallow with first tsp bolus as well as later x1.  Penetrates largely cleared with furhter  swallowing.  Noted pt slight neck extension improved barium containment in pharynx *bypassing slightly open airway* - pt noted to extend neck when swallowing barium tablet with thin liquids. Slight neck extension position may be helpful in the future should pt have issues with swallowing liquids.  Recommend continue diet with precautions - and consider RMST as well as laryngeal elevation/closure exercises given pt's diagnosis of Parkinsons.  Educated pt and his wife to  findings/recommendations. Thanks for this referral.   Neuropsych Eval 07/07/23: Clinical Impression(s): Mr. [name] pattern of performance is suggestive of fairly diffuse cognitive impairment. His greatest areas of impairment surrounded processing speed, attention/concentration, and all aspects of learning and memory. Further weakness/variability was exhibited across executive functioning, especially cognitive flexibility and response inhibition, as well as verbal fluency. Performances were appropriate relative to age-matched peers across abstract reasoning, safety/judgment, confrontation naming, and visuospatial abilities. Functionally, his wife has fully taken over medication management, financial management, and bill paying responsibilities. He also recently decided to stop driving of his own volition due to perceived deficits surrounding reaction time and other cognitive abilities. Given evidence for cognitive impairment and the likelihood that it is directly interfering with day-to-day functioning, I believe that Mr. [name] best meets diagnostic criteria for a Major Neurocognitive Disorder ("dementia") at the present time.   The most likely etiology for his fairly mild dementia presentation would appear to be his history of Parkinson's disease. His patterns of impairment across testing align well with what is expected with this sort of illness. There remains the potential that his bout of metabolic encephalopathy caused by an acute upper respiratory illness in August 2023 could be influencing his overall degree of functioning. However, presentations like this typically improve over time and do not exhibit progressive decline as described by both Mr. [name] and his wife. Despite concern surrounding visual hallucinations, current testing patterns are not particularly compelling with regard to Lewy body disease given relatively intact visuospatial abilities. Hallucinations may be better described as  illusions. He was fairly vague when describing overall alcohol consumption and this could also be playing a relatively mild role in ongoing dysfunction. There is no recent neuroimaging as his most recent brain MRI appears to have been completed in 2018. As such, I cannot theorize about other potential anatomical contributions to ongoing dysfunction. Continued medical monitoring will be important moving forward.   PAIN:  Are you having pain? No  FALLS: Has patient fallen in last 6 months?  Yes, Number of falls: 1 "full fall", and two where pt caught himself before going to the floor  PATIENT GOALS: Improve communication effectiveness  OBJECTIVE:   PATIENT REPORTED OUTCOME MEASURES (PROM): Communication Effectiveness Survey: pt answered stimuli to score 21/32 (higher scores=more effective communication).  TODAY'S TREATMENT:  DATE:  08/08/23: SWALLOWING: SLP taught pt about HEP for swallowing; Req'd usual mod A faded to modified independent. SLP added phrases on the side of each exercise to cue pt for procedure.  SPEECH: pt and SLP walked outdoors and SLP assessed pt's ability to remain with WNL loudness over 10 minutes of conversation outdoors. Pt req'd min A rarely for loudness, and self corrected softer volume speech x1. Pt is satisfied with his speech at this time.   08/04/23: SWALLOWING: MBS results as above in "diagnostic findings". Today SLP reviewed MBS results, recommendations. SLP to provide HEP next session; Masako, Mendelsohn, Supraglottic. SPEECH: Loud /a/ today average mid-upper 80s dB. Pt maintained WNL speech in min complex verbal cognition sentence response tasks 70% of the time and improved to 85% with a verbal cue. Pt success depended heavily on cognitive load. Pt cont'd 100% intelligible with min-mod background noise. Wife agrees pt, even with  more cognitve load is louder than he was prior to ST.   08/02/23: MBS tomorrow. Loud /a/ today average upper 80s dB With sentence level responses pt maintained WNL speech 70% of the time and improved to 90% with nonverbal cue (hand to ear). Pt success depended heavily on cognitive load. Conversation between tasks was WNL 20% of the time, however pt was 100% intelligible with min-mod background noise.   07/26/23: MBS scheduled for 08/03/23. Pt and wife saw neuoropsych yesterday. Pt with diagnosis of mild dementia. Pt told SLP the sentence in "S". Pt was thankful and pleased about this. Loud /a/ today average mid-upper 80s dB In word level responses, pt produced WNL speech 75% of the time, improved to 100% with verbal cue. With sentence level responses pt maintained WNL speech 70% of the time and improved to 90% with nonverbal cue (hand to ear). Cognitive load played a role here. Outdoors pt maintained conversation for 8 minutes with self correction when he was not loud enough - conversation 100% intelligible except after mowers started, pt req'd usual mod A for being at an intelligible volume.   07/19/23: Pt would like to pursue MBS. SLP to send order today. SLP explained to pt and [name] the reason for the MBS, and possible results/outcomes.   Today pt completed loud /a/ with average of mid-upper 80s dB. SLP and pt went outdoors and today in simple conversation the patient exhibited WNL loudness with rare min cues for improving loudness. Wife states when she asks pt to incr loudness he can do so with one request; Prior to this pt was unable to improve loudness.   07/13/23: Pt will be asked about thoughts about MBS next session. Pt completed loud /a/ with average of mid-upper 80s dB. Today in simple conversation the patient exhibited WNL loudness with occasional cues for "shout", which were successful in pt incr'ing loudness 75% of the time.  SLP cont'd to encourage pt to practice at home every day with  louder speech, and to complete loud /a/ BID as well. SLP reviewed some tips on successful communication today with pt and wife, including the fact that pt may cont to talk softly when requiring to multitask with verbal content and necessity to talk louder. Pt demonstrated understanding, as did wife.  07/11/23: Pt endorses rare throat clearing with liquids/meals. Looking back at a DG Esophagus that pt had, he silently aspirated dense barium, and referral was suggested for ST assessment at that time. Given this, SLP recommended MBS at this time. Pt/wife will think about this and will get back to  SLP with thought/s whether to go ahead with MBS or wait. Today SLP assisted pt with tasks to improve speech loudness. Loud /a/ averaged 86 dB. SLP then engaged pt about salient and pertinent topics for pt including hobbies. SLP req'd to provide min-mod cues x5 in 9 minutes conversation; Average dB = 68.  07/03/23: Wife states Slevin's speech has been louder in the last 3 weeks, "I'm less annoying now than I used to be (asking him to repeat)." [name]) Throughout session SLP explained/educated wife on difficulties particularly with speech volume and spoken language generation, and also explained some compensations she may need to employ due to this. Today pt's /a/ req'd initial cues for extending production (pt started with 2-3 second productions). [name] stated pt /a/ productions are short like this at home sometimes. Averaged 90 dB with occasional cues for open mouth. SLP worked with pt with phrase and short sentence responses with occasional mod A for improving volume. Occasionally SLP would have to repeat stimulus due to incorrect response, SLP asked for another response, and pt would thus forget stimulus. SLP worked with pt and wife to get pt signed back in to [facility]. SLP suggested since pt has cataract sx tomorrow that he wait until his eyesight is recovered and then call company and request 14 day trial  restart. He and wife agreeable with this.   06/29/23: Pt educated pt on [facility] and worked with him in downloading and setting up an account. SLP assisted pt occasionally at attention to detail (pushing "next", or looking at all options on the screen prior to hitting something incorrectly while setting up account). In the cognitive tasks, pt req'd mod A consistently for alternating attention. SLP to focus on speech intelligibility as pt will receive neuropsych eval 07-07-23.  06/27/23: SLP acknowledged pt's wife should attend therapy with pt whenever possible. SLP  asked about pt's swallowing status again (first time asked was at eval), and pt mistakenly answered about his SPEAKING; SLP redirected Brunson to answer SLP's question about swallowing and he denied noticing throat clearing and/or coughing with POs. SLP to cont to monitor. Pt states he is performing loud /a/ and practicing louder speech at home with responses worksheets "not as much as (is prescribed) but I'm doing some." SLP strongly reiterated the need for consistent practice to enhance positive change in speech loudness.  SLP assisted pt with loud /a/, providing consistent mod cues for full breath and for loudness throughout the 10 reps. SLP could not fade cues, as pt req'd mod cues the entire task. SLP then had pt read 50 common conversational sentences. At first pt volume was in mid to upper 60s dB, and he req'd occasional min-mod A for loudness. On the third time through, pt became more consistent with loudness in mid 70s dB. SLP again strongly reiterated consistency with practicing louder speech with responses on worksheets for 20 minutes BID.   06/23/23: SLP reiterated the need for daily practice and provided rationale for this. Daughter tells SLP pt does not exhibit any difficulty with swallowing sx during meals, but wonders (from pt's wife) if there's anything that can be done to prevent swallowing issues. SLP suggested 10  effortful swallows BID. Pt tells SLP that he is using more abdominal musculature recruitment than before ST was initiated, and thus "S" statement. SLP shared about communication at drive thr[place] and communicating with wait staff are also good barometers for assessing if pt is speaking louder.  Today, pt's /a/ averaged 84dB, with  occasional min A for loudness. Simple word level responses were  highly dependent upon cognitive load (naming one memober from a simple/common category) Occasional increased to usual min cues needed due to mental fatigue - average mid -upper 60s dB. Answering simple questions re: seasons, months, holidays with one word answers req'd occasional mod cues -average was mid 60s dB. SLP provided homework and daughter stated she could assist pt as necessary with reading and answering.   06/20/23: SLP began by explaining rationale for loud /a/ for daughter, and explaining rationale for daily practice sessions at home. Suggested pt do 20-25 minutes BID, separate from loud /a/. During session SLP also explained that due to pt's cognition he may have more difficulty with mod complex conversation and staying at WNL volume.  In divergent naming task (concrete categories) pt req'd usual mod cues for loudness faded to occasional min-mod cues. By session end, pt began indicating when he did not maintain loudness throughout the utterance. SLP pointed this out to pt and daughter and used it as reinforcement for daily practice with /a/ and 20-25 minutes BID. Daughter typed during session as SLP explained things to pt (re-explained) and daughter.  06/13/23: Pt demo'd sx of reduced working memory as he req'd cues spelling 3-4 letter words backwards today. Pt may benefit from formal comm-cog evaluation.  Generated /a/ with average mid 80s dB with occasional mod A for loudness/strength. In phrase responses (pt unable to adequately read information due to cataracts (sx upcoming) pt req'd usual min-mod cues  faded to occasional min-mod cues for strength and loudness. Pt faded loudness at end of phrases 50% of the time.  SLP administered PROM today with results above.   06/07/23: SLP shaped pt's /a/ today and explained rationale for loud /a/ (explained to pt as "strong /a/" to minimize vocal strain and laryngeal effort). SLP explained treatment options and pt chose a less-programmed option (a plan that would NOT include Spe[employer]!).   PATIENT EDUCATION: Education details: see "today's treatment" Person educated: Patient and daughter Education method: Explanation, Demonstration, Verbal cues, and Handouts Education comprehension: verbalized understanding, returned demonstration, verbal cues required, and needs further education  HOME EXERCISE PROGRAM: 10 strong "ah" BID. Eventually, pt will complete glides and everyday sentences, when and if clinically appropriate.   GOALS: Goals reviewed with patient? No  SHORT TERM GOALS: Target date: 07/13/23  pt will produce loud /a/ or "hey!" with at least upper 80s dB average over three sessions Baseline: 07/03/23 Goal status: Partially met  2.  Pt will produce 16/20 sentence responses with WNL volume over two sessions Baseline:  Goal status: not met  3.  Pt will produce 3 minutes simple conversation with WNL volume over three sessions Baseline:  Goal status: not met  4.  Pt will tell SLP benefit of brain-stimulating tasks (eg, "[facility]", "lumosity", sudoku, etc) Baseline:  Goal status: deferred to work on speech  5.  Pt will undergo bedside swallow assessment if clinically indicated Baseline:  Goal status: Deferred - pt to get back with SLP if MBS desired   LONG TERM GOALS: Target date: 08/12/23  Pt will undergo MBS if clinically indicated Baseline:  Goal status: Met  2.  Pt will produce 10 minutes simple-mod complex conversation with WNL volume given rare nonverbal cues over three sessions Baseline: 07/19/23, 08/08/23 Goal  status: INITIAL  3.  Pt's PROM will show improvement from initial administration Baseline:  Goal status: INITIAL  4.  Pt will report utilizing memory strategies to improve  overall memory, in or between 3 sessions  Baseline:08/08/23  Goal status: INITIAL  ASSESSMENT:  CLINICAL IMPRESSION: SLP educated pt about procedure for Jeremy Davidson, and Supraglottic today. See "today's treatment" for details. Patient is a 72 y.o. M who was seen today for treatment of speech skills in light of his Parkinson disease diagnosed approx 8 years ago. Pt progress in ST continues highly dependent upon cognitive load necessary to participate in dyad conversation/responses, and could be further complicated by pt's consistency with homework. Pt without s/sx PNA this year. Likely d/c next session.  OBJECTIVE IMPAIRMENTS: Objective impairments include attention, memory, dysarthria, and dysphagia. These impairments are limiting patient from managing medications, managing appointments, managing finances, household responsibilities, ADLs/IADLs, effectively communicating at home and in community, and safety when swallowing.Factors affecting potential to achieve goals and functional outcome are ability to learn/carryover information, cooperation/participation level, and previous level of function.. Patient will benefit from skilled SLP services to address above impairments and improve overall function.  REHAB POTENTIAL: Good  PLAN:  SLP FREQUENCY: 2x/week  SLP DURATION: 8 weeks  PLANNED INTERVENTIONS: Aspiration precaution training, Pharyngeal strengthening exercises, Diet toleration management , Environmental controls, Trials of upgraded texture/liquids, Cueing hierachy, Cognitive reorganization, Internal/external aids, Functional tasks, SLP instruction and feedback, Compensatory strategies, Patient/family education, and MBS (possible)    Veronica Fretz, CCC-SLP 08/08/2023, 11:08 AM

## 2023-08-10 ENCOUNTER — Ambulatory Visit: Payer: Medicare HMO

## 2023-08-10 DIAGNOSIS — R41841 Cognitive communication deficit: Secondary | ICD-10-CM

## 2023-08-10 DIAGNOSIS — R471 Dysarthria and anarthria: Secondary | ICD-10-CM | POA: Diagnosis not present

## 2023-08-10 DIAGNOSIS — R1310 Dysphagia, unspecified: Secondary | ICD-10-CM

## 2023-08-10 NOTE — Therapy (Signed)
OUTPATIENT SPEECH LANGUAGE PATHOLOGY TREATMENT/DISCHARGE SUMMARY   Patient Name: Jeremy Davidson MRN: [id number] DOB:08/07/1951, 72 y.o., male Today's Date: 08/10/2023  PCP: Jeremy Bussing, MD REFERRING PROVIDER: Darrow Bussing, MD   END OF SESSION:  End of Session - 08/10/23 1304     Visit Number 15    Number of Visits 17    Date for SLP Re-Evaluation 08/12/23    SLP Start Time 1104    SLP Stop Time  1145    SLP Time Calculation (min) 41 min    Activity Tolerance Patient tolerated treatment well                     Past Medical History:  Diagnosis Date   Acute metabolic encephalopathy 05/23/2022   viral URI   Blood in stool 2010   Colonoscopy benign repeat 2015   Difficulty walking 05/23/2022   Esophageal dysmotility    Essential hypertension 09/09/2015   Hardening of the aorta (main artery of the heart)    Hyperlipidemia 09/09/2015   Hyponatremia 05/23/2022   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 Wife agrees pt is louder than prior to ST.  He has dysphagia exercises he will cont to perform for another 7 weeks, then twice a week after  that.   Remaining deficits: Dysarthria c/b decr'd volume dependent on cognitive load, cog-comm deficit, dysphagia   Education / Equipment: HEP for dysphagia, wife assisting pt with dysphagia HEP, and see therapy notes for more details    Patient agrees to discharge. Patient goals were partially met. Patient is being discharged due to being pleased with the current functional level..      ONSET DATE: Approx 8 years ago, script dated 05-24-23  REFERRING DIAG: PD with dyskinesia  THERAPY DIAG:  Dysarthria and anarthria  Dysphagia, unspecified type  Cognitive communication deficit  Rationale for Evaluation and Treatment: Rehabilitation  SUBJECTIVE:   SUBJECTIVE STATEMENT:  "I feel good today. Definitely speaking louder. Someone asked me yesterday at boxing why I was shouting."   Pt accompanied by: significant other   PERTINENT HISTORY: From Dr. Don Davidson note 03/08/23: Jeremy Davidson was seen today in follow up for Parkinsons disease.  My previous records were reviewed prior to todays visit as well as outside records available to me.  Wife supplements history.  We slightly increased his levodopa last visit.  He tolerated that well, without side effects.  Last visit, he was having a lot of back pain and referred him to physical therapy.  It looks like he only went twice and never went back.  We also referred him for speech therapy and he went 3 times and  did not want to go back.  He states he wasn't sure it helped but wife does think it does and she would like another referral.  No lightheadedness/near syncope.  As far as he knows, BP is running good at home but he's not checking it.   DIAGNOSTIC FINDINGS: MBS 08/03/23: Clinical Impression: Clinical Impression: Patient presents with functional oropharygeal swallow ability without aspiration of any consistency tested including thin, nectar, honey, pudding, cracker and tablet.  His swallow function is characterized by mildly decreased  tongue base retraction and  laryngeal closure/elevation allowing minimal pharyngeal retention and trace penetration of thin liquids x2 during testing.  Penetration noted during the swallow with first tsp bolus as well as later x1.  Penetrates largely cleared with furhter swallowing.  Noted pt slight neck extension improved barium containment in pharynx *bypassing slightly open airway* - pt noted to extend neck when swallowing barium tablet with thin liquids. Slight neck extension position may be helpful in the future should pt have issues with swallowing liquids.  Recommend continue diet with precautions - and consider RMST as well as laryngeal elevation/closure exercises given pt's diagnosis of Parkinsons.  Educated pt and his wife to findings/recommendations. Thanks for this referral.   Neuropsych Eval 07/07/23: Clinical Impression(s): [name] pattern of performance is suggestive of fairly diffuse cognitive impairment. His greatest areas of impairment surrounded processing speed, attention/concentration, and all aspects of learning and memory. Further weakness/variability was exhibited across executive functioning, especially cognitive flexibility and response inhibition, as well as verbal fluency. Performances were appropriate relative to age-matched peers across abstract reasoning, safety/judgment, confrontation naming, and visuospatial abilities. Functionally, his wife has fully taken over medication management, financial management, and bill paying responsibilities. He also recently decided to stop driving of his own volition due to perceived deficits surrounding reaction time and other cognitive abilities. Given evidence for cognitive impairment and the likelihood that it is directly interfering with day-to-day functioning, I believe that [name] best meets diagnostic criteria for a Major Neurocognitive Disorder ("dementia") at the present time.   The most likely etiology for his fairly mild  dementia presentation would appear to be his history of Parkinson's disease. His patterns of impairment across testing align well with what is expected with this sort of illness. There remains the potential that his bout of metabolic encephalopathy caused by an acute upper respiratory illness in August 2023 could be influencing his overall degree of functioning. However, presentations like this typically improve over time and do not exhibit progressive decline as described by both Mr. Dacquisto and his wife. Despite concern surrounding visual hallucinations, current testing patterns are not particularly compelling with regard to Lewy body disease given relatively intact visuospatial abilities. Hallucinations may be better described as illusions. He was fairly vague when describing overall alcohol consumption and this could also be playing a relatively mild role in ongoing dysfunction. There is no recent neuroimaging as his most recent brain MRI appears to have been completed in 2018. As such, I cannot theorize about other potential anatomical contributions to ongoing dysfunction. Continued medical monitoring will be important moving forward.   PAIN:  Are you having pain? No  FALLS: Has patient fallen in last 6 months?  Yes, Number of falls: 1 "full fall", and two where pt caught himself before going to the floor  PATIENT GOALS: Improve communication effectiveness  OBJECTIVE:   PATIENT REPORTED OUTCOME MEASURES (PROM): Communication Effectiveness Survey: pt answered stimuli to score 21/32 (higher scores=more effective communication).  TODAY'S TREATMENT:  DATE:  08/10/23: SWALLOWING: SLP taught pt wife about HEP for swallowing to assist pt PRN. [name] was comfortable after 7 minutes education. Luisa Hart req'd rare mod A faded to modified independent.  SPEECH: pt and SLP  walked outdoors and SLP assessed pt's ability to remain with WNL loudness over 10 minutes of conversation outdoors. Pt self corrected softer volume speech x1. Pt is satisfied with his speech at this time. He improved his PROM score to 26/32.  08/08/23: SWALLOWING: SLP taught pt about HEP for swallowing; Req'd usual mod A faded to modified independent. SLP added phrases on the side of each exercise to cue pt for procedure.  SPEECH: pt and SLP walked outdoors and SLP assessed pt's ability to remain with WNL loudness over 10 minutes of conversation outdoors. Pt req'd min A rarely for loudness, and self corrected softer volume speech x1. Pt is satisfied with his speech at this time.   08/04/23: SWALLOWING: MBS results as above in "diagnostic findings". Today SLP reviewed MBS results, recommendations. SLP to provide HEP next session; Masako, Mendelsohn, Supraglottic. SPEECH: Loud /a/ today average mid-upper 80s dB. Pt maintained WNL speech in min complex verbal cognition sentence response tasks 70% of the time and improved to 85% with a verbal cue. Pt success depended heavily on cognitive load. Pt cont'd 100% intelligible with min-mod background noise. Wife agrees pt, even with more cognitve load is louder than he was prior to ST.   08/02/23: MBS tomorrow. Loud /a/ today average upper 80s dB With sentence level responses pt maintained WNL speech 70% of the time and improved to 90% with nonverbal cue (hand to ear). Pt success depended heavily on cognitive load. Conversation between tasks was WNL 20% of the time, however pt was 100% intelligible with min-mod background noise.   07/26/23: MBS scheduled for 08/03/23. Pt and wife saw neuoropsych yesterday. Pt with diagnosis of mild dementia. Pt told SLP the sentence in "S". Pt was thankful and pleased about this. Loud /a/ today average mid-upper 80s dB In word level responses, pt produced WNL speech 75% of the time, improved to 100% with verbal cue. With sentence  level responses pt maintained WNL speech 70% of the time and improved to 90% with nonverbal cue (hand to ear). Cognitive load played a role here. Outdoors pt maintained conversation for 8 minutes with self correction when he was not loud enough - conversation 100% intelligible except after mowers started, pt req'd usual mod A for being at an intelligible volume.   07/19/23: Pt would like to pursue MBS. SLP to send order today. SLP explained to pt and [name] the reason for the MBS, and possible results/outcomes.   Today pt completed loud /a/ with average of mid-upper 80s dB. SLP and pt went outdoors and today in simple conversation the patient exhibited WNL loudness with rare min cues for improving loudness. Wife states when she asks pt to incr loudness he can do so with one request; Prior to this pt was unable to improve loudness.   07/13/23: Pt will be asked about thoughts about MBS next session. Pt completed loud /a/ with average of mid-upper 80s dB. Today in simple conversation the patient exhibited WNL loudness with occasional cues for "shout", which were successful in pt incr'ing loudness 75% of the time.  SLP cont'd to encourage pt to practice at home every day with louder speech, and to complete loud /a/ BID as well. SLP reviewed some tips on successful communication today with pt and wife, including the  fact that pt may cont to talk softly when requiring to multitask with verbal content and necessity to talk louder. Pt demonstrated understanding, as did wife.  07/11/23: Pt endorses rare throat clearing with liquids/meals. Looking back at a DG Esophagus that pt had, he silently aspirated dense barium, and referral was suggested for ST assessment at that time. Given this, SLP recommended MBS at this time. Pt/wife will think about this and will get back to SLP with thought/s whether to go ahead with MBS or wait. Today SLP assisted pt with tasks to improve speech loudness. Loud /a/ averaged 86 dB. SLP then  engaged pt about salient and pertinent topics for pt including hobbies. SLP req'd to provide min-mod cues x5 in 9 minutes conversation; Average dB = 68.  07/03/23: Wife states Baraka's speech has been louder in the last 3 weeks, "I'm less annoying now than I used to be (asking him to repeat)." [name]) Throughout session SLP explained/educated wife on difficulties particularly with speech volume and spoken language generation, and also explained some compensations she may need to employ due to this. Today pt's /a/ req'd initial cues for extending production (pt started with 2-3 second productions). [name] stated pt /a/ productions are short like this at home sometimes. Averaged 90 dB with occasional cues for open mouth. SLP worked with pt with phrase and short sentence responses with occasional mod A for improving volume. Occasionally SLP would have to repeat stimulus due to incorrect response, SLP asked for another response, and pt would thus forget stimulus. SLP worked with pt and wife to get pt signed back in to [facility]. SLP suggested since pt has cataract sx tomorrow that he wait until his eyesight is recovered and then call company and request 14 day trial restart. He and wife agreeable with this.   06/29/23: Pt educated pt on [facility] and worked with him in downloading and setting up an account. SLP assisted pt occasionally at attention to detail (pushing "next", or looking at all options on the screen prior to hitting something incorrectly while setting up account). In the cognitive tasks, pt req'd mod A consistently for alternating attention. SLP to focus on speech intelligibility as pt will receive neuropsych eval 07-07-23.  06/27/23: SLP acknowledged pt's wife should attend therapy with pt whenever possible. SLP  asked about pt's swallowing status again (first time asked was at eval), and pt mistakenly answered about his SPEAKING; SLP redirected Kamrin to answer SLP's question about  swallowing and he denied noticing throat clearing and/or coughing with POs. SLP to cont to monitor. Pt states he is performing loud /a/ and practicing louder speech at home with responses worksheets "not as much as (is prescribed) but I'm doing some." SLP strongly reiterated the need for consistent practice to enhance positive change in speech loudness.  SLP assisted pt with loud /a/, providing consistent mod cues for full breath and for loudness throughout the 10 reps. SLP could not fade cues, as pt req'd mod cues the entire task. SLP then had pt read 50 common conversational sentences. At first pt volume was in mid to upper 60s dB, and he req'd occasional min-mod A for loudness. On the third time through, pt became more consistent with loudness in mid 70s dB. SLP again strongly reiterated consistency with practicing louder speech with responses on worksheets for 20 minutes BID.   06/23/23: SLP reiterated the need for daily practice and provided rationale for this. Daughter tells SLP pt does not exhibit any difficulty  with swallowing sx during meals, but wonders (from pt's wife) if there's anything that can be done to prevent swallowing issues. SLP suggested 10 effortful swallows BID. Pt tells SLP that he is using more abdominal musculature recruitment than before ST was initiated, and thus "S" statement. SLP shared about communication at drive thrus and communicating with wait staff are also good barometers for assessing if pt is speaking louder.  Today, pt's /a/ averaged 84dB, with occasional min A for loudness. Simple word level responses were  highly dependent upon cognitive load (naming one memober from a simple/common category) Occasional increased to usual min cues needed due to mental fatigue - average mid -upper 60s dB. Answering simple questions re: seasons, months, holidays with one word answers req'd occasional mod cues -average was mid 60s dB. SLP provided homework and daughter stated she could  assist pt as necessary with reading and answering.   06/20/23: SLP began by explaining rationale for loud /a/ for daughter, and explaining rationale for daily practice sessions at home. Suggested pt do 20-25 minutes BID, separate from loud /a/. During session SLP also explained that due to pt's cognition he may have more difficulty with mod complex conversation and staying at WNL volume.  In divergent naming task (concrete categories) pt req'd usual mod cues for loudness faded to occasional min-mod cues. By session end, pt began indicating when he did not maintain loudness throughout the utterance. SLP pointed this out to pt and daughter and used it as reinforcement for daily practice with /a/ and 20-25 minutes BID. Daughter typed during session as SLP explained things to pt (re-explained) and daughter.  06/13/23: Pt demo'd sx of reduced working memory as he req'd cues spelling 3-4 letter words backwards today. Pt may benefit from formal comm-cog evaluation.  Generated /a/ with average mid 80s dB with occasional mod A for loudness/strength. In phrase responses (pt unable to adequately read information due to cataracts (sx upcoming) pt req'd usual min-mod cues faded to occasional min-mod cues for strength and loudness. Pt faded loudness at end of phrases 50% of the time.  SLP administered PROM today with results above.   06/07/23: SLP shaped pt's /a/ today and explained rationale for loud /a/ (explained to pt as "strong /a/" to minimize vocal strain and laryngeal effort). SLP explained treatment options and pt chose a less-programmed option (a plan that would NOT include [employer]!).   PATIENT EDUCATION: Education details: see "today's treatment" Person educated: Patient and daughter Education method: Explanation, Demonstration, Verbal cues, and Handouts Education comprehension: verbalized understanding, returned demonstration, verbal cues required, and needs further education  HOME EXERCISE PROGRAM: 10  strong "ah" BID. Eventually, pt will complete glides and everyday sentences, when and if clinically appropriate.   GOALS: Goals reviewed with patient? No  SHORT TERM GOALS: Target date: 07/13/23  pt will produce loud /a/ or "hey!" with at least upper 80s dB average over three sessions Baseline: 07/03/23 Goal status: Partially met  2.  Pt will produce 16/20 sentence responses with WNL volume over two sessions Baseline:  Goal status: not met  3.  Pt will produce 3 minutes simple conversation with WNL volume over three sessions Baseline:  Goal status: not met  4.  Pt will tell SLP benefit of brain-stimulating tasks (eg, "[facility]", "lumosity", sudoku, etc) Baseline:  Goal status: deferred to work on speech  5.  Pt will undergo bedside swallow assessment if clinically indicated Baseline:  Goal status: Deferred - pt to get back with SLP if  MBS desired   LONG TERM GOALS: Target date: 08/12/23  Pt will undergo MBS if clinically indicated Baseline:  Goal status: Met  2.  Pt will produce 10 minutes simple-mod complex conversation with WNL volume given rare nonverbal cues over three sessions Baseline: 07/19/23, 08/08/23 Goal status: Met  3.  Pt's PROM will show improvement from initial administration Baseline:  Goal status: met  4.  Pt will report utilizing memory strategies to improve overall memory, in or between 3 sessions  Baseline:08/08/23  Goal status: partially met  ASSESSMENT:  CLINICAL IMPRESSION: SLP educated wife about procedure for Masako, Clair Gulling, and Supraglottic today, to assist pt with HEP PRN. See "today's treatment" for details. Patient is a 72 y.o. M who was seen today for treatment of speech skills in light of his Parkinson disease diagnosed approx 8 years ago. Pt progress in ST continues highly dependent upon cognitive load necessary to participate in dyad conversation/responses, and could be further complicated by pt's consistency with homework.  Pt without s/sx PNA this year. Likely d/c next session.  OBJECTIVE IMPAIRMENTS: Objective impairments include attention, memory, dysarthria, and dysphagia. These impairments are limiting patient from managing medications, managing appointments, managing finances, household responsibilities, ADLs/IADLs, effectively communicating at home and in community, and safety when swallowing.Factors affecting potential to achieve goals and functional outcome are ability to learn/carryover information, cooperation/participation level, and previous level of function.. Patient will benefit from skilled SLP services to address above impairments and improve overall function.  REHAB POTENTIAL: Good  PLAN:  SLP FREQUENCY: 2x/week  SLP DURATION: 8 weeks  PLANNED INTERVENTIONS: Aspiration precaution training, Pharyngeal strengthening exercises, Diet toleration management , Environmental controls, Trials of upgraded texture/liquids, Cueing hierachy, Cognitive reorganization, Internal/external aids, Functional tasks, SLP instruction and feedback, Compensatory strategies, Patient/family education, and MBS (possible)    Scharlene Catalina, CCC-SLP 08/10/2023, 1:04 PM

## 2023-08-18 DIAGNOSIS — R69 Illness, unspecified: Secondary | ICD-10-CM | POA: Diagnosis not present

## 2023-09-06 DIAGNOSIS — Z79899 Other long term (current) drug therapy: Secondary | ICD-10-CM | POA: Diagnosis not present

## 2023-09-06 DIAGNOSIS — E7800 Pure hypercholesterolemia, unspecified: Secondary | ICD-10-CM | POA: Diagnosis not present

## 2023-09-17 ENCOUNTER — Other Ambulatory Visit: Payer: Self-pay | Admitting: Neurology

## 2023-09-17 DIAGNOSIS — G20A1 Parkinson's disease without dyskinesia, without mention of fluctuations: Secondary | ICD-10-CM

## 2023-09-21 NOTE — Progress Notes (Signed)
Assessment/Plan:   1.  Parkinsons Disease  -continue carbidopa/levodopa 25/100, 2 tablet at 6 AM/2 at 10am/2 at 2pm/1 at 6 PM.   -continue carbidopa/levodopa 50/200 at bed for first AM on  2.  Insomnia  -no napping after 2 pm   3.  Constipation  -discussed nature and pathophysiology and association with PD  -discussed importance of hydration.  Pt is to increase water intake   4.  Mild dementia  -Doing mental and physical exercises  -neurocog testing in 06/2023 with evidence of mild dementia  -start aricept 5 mg daily.  R/B/SE were discussed.  The opportunity to ask questions was given and they were answered to the best of my ability.  The patient expressed understanding and willingness to follow the outlined treatment protocols. -watching hallucinations.  Will see if donepezil helps   5.  Depression  -continue lexapro 10 mg daily  6.  Fatigue  -may be related to #5 but discussed that fatigue is the #1 treatment resistant complaint of Parkinson's patients.  7.  LBP  -resolved now  8.  Low BP  -discussed again increasing water intake  Subjective:   Jeremy Davidson was seen today in follow up for Parkinsons disease.  My previous records were reviewed prior to todays visit as well as outside records available to me.  Wife supplements history.  We increased his levodopa last visit.  He tolerated that well, without side effects.  He has been to speech therapy since last visit.  Notes are reviewed.  He had neurocognitive testing with Dr. Milbert Coulter in September and demonstrated mild dementia.  He feels cognitively slow.  He is not driving.  His daughters text him at pill time.  His wife does finances.  No falls.  He is exercising faithfully.  He has had hallucinations usually daily of people, cats, fire hydrants.  Its usually transient and fast.   Current prescribed movement disorder medications: Carbidopa/levodopa 25/100, 2 tablet at 6 AM/2 at 10am/2 at 2pm/1 at 6 PM.   (Increased) Carbidopa/levodopa 50/200 CR at bedtime  Lexapro, 10 mg daily    PREVIOUS MEDICATIONS: Sinemet and Mirapex (weaned off because of delusions)  ALLERGIES:  No Known Allergies  CURRENT MEDICATIONS:  Outpatient Encounter Medications as of 09/25/2023  Medication Sig   Ascorbic Acid (VITAMIN C) 1000 MG tablet Take 1,000 mg by mouth daily.   atorvastatin (LIPITOR) 40 MG tablet Take 40 mg by mouth at bedtime.   carbidopa-levodopa (SINEMET CR) 50-200 MG tablet TAKE 1 TABLET BY MOUTH EVERYDAY AT BEDTIME   carbidopa-levodopa (SINEMET IR) 25-100 MG tablet TAKE 2 TABLETS AT 6AM AND 1 TABLET AT 10AM/2PM/6PM   docusate sodium (STOOL SOFTENER) 100 MG capsule Take 100 mg by mouth 2 (two) times daily.   escitalopram (LEXAPRO) 10 MG tablet TAKE 1 TABLET BY MOUTH EVERY DAY   Multiple Vitamin (MULTIVITAMIN) tablet Take 1 tablet by mouth daily. Men's health formula multi-mineral 50+   phenol (CHLORASEPTIC) 1.4 % LIQD Use as directed 1 spray in the mouth or throat as needed for throat irritation / pain.   solifenacin (VESICARE) 5 MG tablet Take 5 mg by mouth daily.   tamsulosin (FLOMAX) 0.4 MG CAPS capsule Take 0.4 mg by mouth daily. (Patient not taking: Reported on 09/25/2023)   No facility-administered encounter medications on file as of 09/25/2023.    Objective:   PHYSICAL EXAMINATION:    VITALS:   Vitals:   09/25/23 0816  BP: 124/68  Pulse: 71  SpO2: 96%  Weight: 178 lb 9.6 oz (81 kg)  Height: 5\' 11"  (1.803 m)     GEN:  The patient appears stated age and is in NAD. HEENT:  Normocephalic, atraumatic.  The mucous membranes are moist. The superficial temporal arteries are without ropiness or tenderness. CV:  RRR Lungs:  CTAB Neck/HEME:  There are no carotid bruits bilaterally.  Neurological examination:  Orientation: The patient is alert and oriented x3. Cranial nerves: There is good facial symmetry with facial hypomimia. The speech is fluent and clear. Soft palate rises  symmetrically and there is no tongue deviation. Hearing is intact to conversational tone. Sensation: Sensation is intact to light touch throughout Motor: Strength is at least antigravity x4.  Movement examination: Tone: There is mod increased tone in the LUE and mild increased in the RUE Abnormal movements: mild LUE rest tremor Coordination:  There is mild decremation with RAM's, with any form of RAMS, including alternating supination and pronation of the forearm, hand opening and closing, finger taps, heel taps and toe taps., L>R (same as previous) Gait and Station: The patient ambulates well in our hallway and is steady but doesn't have a lot of armswing b/l  Total time spent on today's visit was 30 minutes, including both face-to-face time and nonface-to-face time.  Time included that spent on review of records (prior notes available to me/labs/imaging if pertinent), discussing treatment and goals, answering patient's questions and coordinating care.  Cc:  Darrow Bussing, MD

## 2023-09-25 ENCOUNTER — Ambulatory Visit: Payer: Medicare HMO | Admitting: Neurology

## 2023-09-25 ENCOUNTER — Other Ambulatory Visit: Payer: Self-pay

## 2023-09-25 VITALS — BP 124/68 | HR 71 | Ht 71.0 in | Wt 178.6 lb

## 2023-09-25 DIAGNOSIS — F02A0 Dementia in other diseases classified elsewhere, mild, without behavioral disturbance, psychotic disturbance, mood disturbance, and anxiety: Secondary | ICD-10-CM | POA: Diagnosis not present

## 2023-09-25 DIAGNOSIS — G20A1 Parkinson's disease without dyskinesia, without mention of fluctuations: Secondary | ICD-10-CM

## 2023-09-25 MED ORDER — DONEPEZIL HCL 5 MG PO TABS: 5.0000 mg | ORAL_TABLET | Freq: Every day | ORAL | 1 refills | Status: DC

## 2023-09-25 MED ORDER — CARBIDOPA-LEVODOPA 25-100 MG PO TABS: ORAL_TABLET | ORAL | Status: DC

## 2023-09-25 MED ORDER — CARBIDOPA-LEVODOPA 25-100 MG PO TABS: ORAL_TABLET | ORAL | 1 refills | Status: DC

## 2023-10-17 ENCOUNTER — Telehealth: Payer: Self-pay | Admitting: Neurology

## 2023-10-17 DIAGNOSIS — U071 COVID-19: Secondary | ICD-10-CM | POA: Diagnosis not present

## 2023-10-17 NOTE — Telephone Encounter (Signed)
Patient tested positive for covid and wife needs to speak with someone about medications.  Left a message with the after hour service

## 2023-10-17 NOTE — Telephone Encounter (Signed)
 Patient is on paxlovid, FYI. Pharmacy said,"it was safe to take,".

## 2023-11-07 ENCOUNTER — Other Ambulatory Visit: Payer: Self-pay | Admitting: Neurology

## 2023-11-07 DIAGNOSIS — G20A1 Parkinson's disease without dyskinesia, without mention of fluctuations: Secondary | ICD-10-CM

## 2023-11-07 DIAGNOSIS — F330 Major depressive disorder, recurrent, mild: Secondary | ICD-10-CM

## 2023-12-03 ENCOUNTER — Other Ambulatory Visit: Payer: Self-pay | Admitting: Neurology

## 2023-12-03 DIAGNOSIS — F330 Major depressive disorder, recurrent, mild: Secondary | ICD-10-CM

## 2023-12-06 ENCOUNTER — Encounter: Payer: Self-pay | Admitting: Neurology

## 2023-12-31 ENCOUNTER — Other Ambulatory Visit: Payer: Self-pay | Admitting: Neurology

## 2023-12-31 DIAGNOSIS — F330 Major depressive disorder, recurrent, mild: Secondary | ICD-10-CM

## 2023-12-31 DIAGNOSIS — G20A1 Parkinson's disease without dyskinesia, without mention of fluctuations: Secondary | ICD-10-CM

## 2024-01-03 DIAGNOSIS — K224 Dyskinesia of esophagus: Secondary | ICD-10-CM | POA: Diagnosis not present

## 2024-01-03 DIAGNOSIS — E7800 Pure hypercholesterolemia, unspecified: Secondary | ICD-10-CM | POA: Diagnosis not present

## 2024-01-03 DIAGNOSIS — Z9189 Other specified personal risk factors, not elsewhere classified: Secondary | ICD-10-CM | POA: Diagnosis not present

## 2024-01-03 DIAGNOSIS — Z79899 Other long term (current) drug therapy: Secondary | ICD-10-CM | POA: Diagnosis not present

## 2024-03-19 NOTE — Progress Notes (Deleted)
 Assessment/Plan:   1.  Parkinsons Disease  -continue carbidopa /levodopa  25/100, 2 tablet at 6 AM/2 at 10am/2 at 2pm/1 at 6 PM.   -continue carbidopa /levodopa  50/200 at bed for first AM on  2.  Insomnia  -no napping after 2 pm   3.  Constipation  -discussed nature and pathophysiology and association with PD  -discussed importance of hydration.  Pt is to increase water intake   4.  Mild dementia  -Doing mental and physical exercises  -neurocog testing in 06/2023 with evidence of mild dementia  - Continue donepezil , 5 mg daily.  5.  Depression  -continue lexapro  10 mg daily  6.  Fatigue  -may be related to #5 but discussed that fatigue is the #1 treatment resistant complaint of Parkinson's patients.  7.  LBP  -resolved now  8.  Low BP  -discussed again increasing water intake  Subjective:   Jeremy Davidson was seen today in follow up for Parkinsons disease.  My previous records were reviewed prior to todays visit as well as outside records available to me.  Wife supplements history.  Patient was started on donepezil  last visit.  He was tolerating it well.  In regards to hallucinations, he reports that ***   Current prescribed movement disorder medications: Carbidopa /levodopa  25/100, 2 tablet at 6 AM/2 at 10am/2 at 2pm/1 at 6 PM.  (Increased) Carbidopa /levodopa  50/200 CR at bedtime  Lexapro , 10 mg daily  Donepezil , 5 mg daily (started last visit)  PREVIOUS MEDICATIONS: Sinemet  and Mirapex  (weaned off because of delusions)  ALLERGIES:  No Known Allergies  CURRENT MEDICATIONS:  Outpatient Encounter Medications as of 03/25/2024  Medication Sig   Ascorbic Acid (VITAMIN C) 1000 MG tablet Take 1,000 mg by mouth daily.   atorvastatin  (LIPITOR) 40 MG tablet Take 40 mg by mouth at bedtime.   carbidopa -levodopa  (SINEMET  CR) 50-200 MG tablet TAKE 1 TABLET BY MOUTH EVERYDAY AT BEDTIME   carbidopa -levodopa  (SINEMET  IR) 25-100 MG tablet TAKE 2 TABLETS AT 6AM /10 AM/2 PM AND 1  TABLET AT 6 PM   docusate sodium (STOOL SOFTENER) 100 MG capsule Take 100 mg by mouth 2 (two) times daily.   donepezil  (ARICEPT ) 5 MG tablet TAKE 1 TABLET BY MOUTH EVERYDAY AT BEDTIME   escitalopram  (LEXAPRO ) 10 MG tablet TAKE 1 TABLET DAILY   Multiple Vitamin (MULTIVITAMIN) tablet Take 1 tablet by mouth daily. Men's health formula multi-mineral 50+   phenol (CHLORASEPTIC) 1.4 % LIQD Use as directed 1 spray in the mouth or throat as needed for throat irritation / pain.   solifenacin (VESICARE) 5 MG tablet Take 5 mg by mouth daily.   No facility-administered encounter medications on file as of 03/25/2024.    Objective:   PHYSICAL EXAMINATION:    VITALS:   There were no vitals filed for this visit.    GEN:  The patient appears stated age and is in NAD. HEENT:  Normocephalic, atraumatic.  The mucous membranes are moist. The superficial temporal arteries are without ropiness or tenderness. CV:  RRR Lungs:  CTAB Neck/HEME:  There are no carotid bruits bilaterally.  Neurological examination:  Orientation: The patient is alert and oriented x3. Cranial nerves: There is good facial symmetry with facial hypomimia. The speech is fluent and clear. Soft palate rises symmetrically and there is no tongue deviation. Hearing is intact to conversational tone. Sensation: Sensation is intact to light touch throughout Motor: Strength is at least antigravity x4.  Movement examination: Tone: There is mod increased tone in the  LUE and mild increased in the RUE Abnormal movements: mild LUE rest tremor Coordination:  There is mild decremation with RAM's, with any form of RAMS, including alternating supination and pronation of the forearm, hand opening and closing, finger taps, heel taps and toe taps., L>R (same as previous) Gait and Station: The patient ambulates well in our hallway and is steady but doesn't have a lot of armswing b/l  Total time spent on today's visit was *** minutes, including both  face-to-face time and nonface-to-face time.  Time included that spent on review of records (prior notes available to me/labs/imaging if pertinent), discussing treatment and goals, answering patient's questions and coordinating care.  Cc:  Jeremy Pinal, MD

## 2024-03-21 ENCOUNTER — Other Ambulatory Visit: Payer: Self-pay | Admitting: Neurology

## 2024-03-21 DIAGNOSIS — G20A1 Parkinson's disease without dyskinesia, without mention of fluctuations: Secondary | ICD-10-CM

## 2024-03-25 ENCOUNTER — Ambulatory Visit: Payer: Medicare HMO | Admitting: Neurology

## 2024-03-25 ENCOUNTER — Encounter: Payer: Self-pay | Admitting: Neurology

## 2024-03-25 DIAGNOSIS — Z029 Encounter for administrative examinations, unspecified: Secondary | ICD-10-CM

## 2024-04-05 ENCOUNTER — Ambulatory Visit: Admitting: Neurology

## 2024-04-07 ENCOUNTER — Other Ambulatory Visit: Payer: Self-pay | Admitting: Neurology

## 2024-04-07 DIAGNOSIS — F330 Major depressive disorder, recurrent, mild: Secondary | ICD-10-CM

## 2024-04-07 DIAGNOSIS — G20A1 Parkinson's disease without dyskinesia, without mention of fluctuations: Secondary | ICD-10-CM

## 2024-04-07 DIAGNOSIS — R4189 Other symptoms and signs involving cognitive functions and awareness: Secondary | ICD-10-CM

## 2024-04-23 NOTE — Therapy (Signed)
 Physical Therapy Parkinson's Disease Screen  Patient reports that dizziness and lightheadedness are constant- this has been going on for the last couple years. Reports this he has to grab onto things. Reports recently he was getting out of a rocking chair while carrying something and fell over backwards. Son prevented him from hitting his head. Reports that he does personal training, P[employer] class, and boxing to stay active.  Pt denies issues with hand dexterity or completing ADLs- wishes to keep an eye on his handwriting and consider OT at a later time. Reports that he has ongoing voice volume and memory issues.   Timed Up and Go test:10.45 sec compared to 9 sec on 10/05/22  10 meter walk test:9.03 sec compared to 9 sec on 10/05/22  5 time sit to stand test:9.29 sec     Patient would benefit from Physical Therapy evaluation due to dizziness and recent hx of fall. Patient would also benefit from ST due to ongoing memory issues and hypophonia.       [facility]   It is recommended that people with Parkinson's Disease see an occupational therapist, physical therapist, and speech therapist every 6-12 months for evaluation.  When you complete your therapy initially, your therapists will recommend a follow-up type and schedule specifically for you.    Screens are free, 15 minutes with each therapy discipline (PT, OT, speech therapy), and are to determine if you would benefit from a full therapy evaluation and treatment.  If a therapy discipline is recommended, your therapist will request an order from your physician.  Then our front office staff will call you to schedule a full evaluation prior to the start of therapy.  An evaluation may also be recommended at your screen/discharge if you have not had therapy or updates to your personal home therapy program in the last 1-2 years.  Evaluations require a physician order and are approximately 40 minutes with  each therapy to help develop target areas for therapy treatment.    If you notice significant changes prior to your follow-up, please talk to your doctor about returning for an evaluation sooner.   If you answer "yes" to any of these questions you may benefit from therapy, particularly if this has changed in the last 3-6 months.    If you answer "Yes" to the following, you may benefit from occupational therapy.  Do you have difficulty cutting your food or do you spill food off your spoon or fork when eating? Yes___   No__x_          Do you have difficulty brushing your teeth, brushing your hair, or shaving? Yes___   No__x_          Do you have difficulty getting dressed (fastening buttons, tying your shoes, putting on your socks, pulling a shirt down in the back)? Yes___   No__x_          Do you have difficulty getting in or out of the shower? Yes___   No__x_          Does your writing get small or is it hard to read?   Yes__x_   No___           Do you have increased difficulty with cooking, cleaning, or shopping?  (reaching for items in cabinets, carrying pots and pans, carrying laundry basket, carrying groceries, etc.)  Yes___   No__x_          Do you have shoulder pain?  Yes___   No__x_           Is it taking you longer to get ready for the day than it did 6 months ago? Yes___   No__x_  If you answer "Yes" to the following, you may benefit from physical therapy.   Do you have falls?     Yes___   No_x__           Do you feel unsteady while walking or notice changes in your balance?       Yes__x_   No___           Do you have difficulty getting started walking?   Yes__x_   No___           Do you experience "freezing" where you feel like your feet are stuck to the ground?     Yes___   No_x__            If you answer "Yes" to the following, you may benefit from speech therapy.    Do others tell you that they have difficulty hearing you?      Yes___   No_x__           Do you have difficulty swallowing food and/or liquids?             Yes___   No__x_          Do you have excess saliva?      Yes___   No_x__           During meals, do you cough or clear throat excessively?        Yes___   No__x_

## 2024-04-25 ENCOUNTER — Ambulatory Visit: Payer: Medicare HMO | Admitting: Physical Therapy

## 2024-04-25 ENCOUNTER — Ambulatory Visit: Payer: Medicare HMO | Admitting: Occupational Therapy

## 2024-04-25 ENCOUNTER — Encounter: Payer: Self-pay | Admitting: Physical Therapy

## 2024-04-25 ENCOUNTER — Telehealth: Payer: Self-pay | Admitting: Physical Therapy

## 2024-04-25 ENCOUNTER — Ambulatory Visit: Payer: Medicare HMO | Attending: Family Medicine

## 2024-04-25 DIAGNOSIS — R2689 Other abnormalities of gait and mobility: Secondary | ICD-10-CM

## 2024-04-25 NOTE — Telephone Encounter (Signed)
 Hi Dr. Evonnie,  Mr. Aiken was seen for PT, OT, speech therapy screens in our multi-disciplinary clinic.  Physical therapy is recommended for follow up due to dizziness and recent fall. Speech therapy is recommended due to ongoing memory issues and hypophonia. If you agree, please send referral via Epic for PT and ST.  Thank you.    Louana Terrilyn Christians, PT, DPT 04/25/24 12:34 PM  [facility] [address] [place], [address] [place], [place] [address] Phone # [phone] Fax # [phone]

## 2024-05-03 ENCOUNTER — Encounter: Payer: Self-pay | Admitting: Advanced Practice Midwife

## 2024-05-16 DIAGNOSIS — E7800 Pure hypercholesterolemia, unspecified: Secondary | ICD-10-CM | POA: Diagnosis not present

## 2024-05-16 DIAGNOSIS — F321 Major depressive disorder, single episode, moderate: Secondary | ICD-10-CM | POA: Diagnosis not present

## 2024-05-16 DIAGNOSIS — G20B2 Parkinson's disease with dyskinesia, with fluctuations: Secondary | ICD-10-CM | POA: Diagnosis not present

## 2024-05-28 NOTE — Progress Notes (Signed)
 Assessment/Plan:   1.  Parkinsons Disease             -continue carbidopa /levodopa  25/100, 2 tablet at 6 AM/2 at 10am/2 at 2pm/1 at 6 PM.              -continue carbidopa /levodopa  50/200 at bed for first AM on  -Will send orders for physical and speech therapy.  -they asked about vyalev.  Discussed r/b/se.  Ultimately, wife states he has sensory issues and doesn't even like to wear a wedding ring.   2.  Insomnia             -no napping after 2 pm     3.  Constipation             -discussed nature and pathophysiology and association with PD             -discussed importance of hydration.  Pt is to increase water intake     4.  Mild dementia             -Doing mental and physical exercises             -neurocog testing in 06/2023 with evidence of mild dementia             - Continue donepezil , 5 mg daily.  Has helped with hallucination.   5.  Depression             -continue lexapro  10 mg daily   6.  Fatigue             -may be related to #5 but discussed that fatigue is the #1 treatment resistant complaint of Parkinson's patients.   7.  Probably Neurogenic Orthostatic Hypotension   -start midodrine  2.5 mg tid with meals  -raise HOB at night  - Increase water intake.  Subjective:   Jeremy Davidson was seen today in follow up for Parkinsons disease.  My previous records were reviewed prior to todays visit as well as outside records available to me.  Wife supplements history.  Patient was started on donepezil  last visit.  He is tolerating it well.  In regards to hallucinations, he reports that he is doing great.  He has some floaters but no hallucinations.  His BP has been low but he's not so good with the water and you will need to lecture us .    His wife emailed me back in February about [place] and asked about it, but they thought it was in clinical trial, when it was already on the market.  We expressed to them that it was already on the market, but at that time Medicare  was not paying for it.  He missed his June appointment when I planned on discussing it further.  He has been to therapy screens and they have requested orders for physical and speech therapy.     Current prescribed movement disorder medications: Carbidopa /levodopa  25/100, 2 tablet at 6 AM/2 at 10am/2 at 2pm/1 at 6 PM.  (Increased) Carbidopa /levodopa  50/200 CR at bedtime  Lexapro , 10 mg daily  Donepezil , 5 mg daily (started last visit)   Current prescribed movement disorder medications: Carbidopa /levodopa  25/100, 2 tablet at 6 AM/2 at 10am/2 at 2pm/1 at 6 PM.  (Increased) Carbidopa /levodopa  50/200 CR at bedtime  Lexapro , 10 mg daily    PREVIOUS MEDICATIONS: Sinemet  and Mirapex  (weaned off because of delusions)  ALLERGIES:  No Known Allergies  CURRENT MEDICATIONS:  Outpatient Encounter Medications as of 05/30/2024  Medication Sig   Ascorbic Acid (VITAMIN C) 1000 MG tablet Take 1,000 mg by mouth daily.   atorvastatin  (LIPITOR) 40 MG tablet Take 40 mg by mouth at bedtime.   carbidopa -levodopa  (SINEMET  CR) 50-200 MG tablet TAKE 1 TABLET BY MOUTH EVERYDAY AT BEDTIME   carbidopa -levodopa  (SINEMET  IR) 25-100 MG tablet TAKE 2 TABLETS BY MOUTH AT 6AM /10 AM/2 PM AND 1 TABLET AT 6 PM   docusate sodium (STOOL SOFTENER) 100 MG capsule Take 100 mg by mouth 2 (two) times daily.   donepezil  (ARICEPT ) 5 MG tablet TAKE 1 TABLET BY MOUTH EVERYDAY AT BEDTIME   escitalopram  (LEXAPRO ) 10 MG tablet TAKE 1 TABLET DAILY   Multiple Vitamin (MULTIVITAMIN) tablet Take 1 tablet by mouth daily. Men's health formula multi-mineral 50+   phenol (CHLORASEPTIC) 1.4 % LIQD Use as directed 1 spray in the mouth or throat as needed for throat irritation / pain.   solifenacin (VESICARE) 5 MG tablet Take 5 mg by mouth daily.   No facility-administered encounter medications on file as of 05/30/2024.    Objective:   PHYSICAL EXAMINATION:    VITALS:   Vitals:   05/30/24 1420  BP: 90/64  Pulse: 75  SpO2: 96%   Weight: 176 lb 3.2 oz (79.9 kg)    GEN:  The patient appears stated age and is in NAD. HEENT:  Normocephalic, atraumatic.  The mucous membranes are moist. The superficial temporal arteries are without ropiness or tenderness. CV:  RRR Lungs:  CTAB Neck/HEME:  There are no carotid bruits bilaterally.  Neurological examination:  Orientation: The patient is alert and oriented x3. Cranial nerves: There is good facial symmetry with facial hypomimia. The speech is fluent and clear. Soft palate rises symmetrically and there is no tongue deviation. Hearing is intact to conversational tone. Sensation: Sensation is intact to light touch throughout Motor: Strength is at least antigravity x4.  Took carbidopa /levodopa  about 50 min ago  Movement examination: Tone: There is mild increased tone in the RUE/RLE Abnormal movements: mild bilateral UE rest tremor, L>R Coordination:  There is mild decremation with RAM's, with any form of RAMS, including alternating supination and pronation of the forearm, hand opening and closing, finger taps, heel taps and toe taps., L>R (same as previous) Gait and Station: The patient ambulates well in our hallway and is steady but doesn't have a lot of armswing b/l  Total time spent on today's visit was 40 minutes, including both face-to-face time and nonface-to-face time.  Time included that spent on review of records (prior notes available to me/labs/imaging if pertinent), discussing treatment and goals, answering patient's questions and coordinating care.  Cc:  Regino Slater, MD

## 2024-05-30 ENCOUNTER — Encounter: Payer: Self-pay | Admitting: Neurology

## 2024-05-30 ENCOUNTER — Ambulatory Visit: Admitting: Neurology

## 2024-05-30 VITALS — BP 90/64 | HR 75 | Wt 176.2 lb

## 2024-05-30 DIAGNOSIS — G903 Multi-system degeneration of the autonomic nervous system: Secondary | ICD-10-CM | POA: Diagnosis not present

## 2024-05-30 DIAGNOSIS — G20A1 Parkinson's disease without dyskinesia, without mention of fluctuations: Secondary | ICD-10-CM | POA: Diagnosis not present

## 2024-05-30 MED ORDER — MIDODRINE HCL 2.5 MG PO TABS: 2.5000 mg | ORAL_TABLET | Freq: Three times a day (TID) | ORAL | 1 refills | Status: DC

## 2024-05-30 NOTE — Patient Instructions (Signed)
 Start midodrine  2.5 mg three times per day with meals Raise head of bed or use wedge pillow  The physicians and staff at [facility] are committed to providing excellent care. You may receive a survey requesting feedback about your experience at our office. We strive to receive very good responses to the survey questions. If you feel that your experience would prevent you from giving the office a very good  response, please contact our office to try to remedy the situation. We may be reached at [phone]. Thank you for taking the time out of your busy day to complete the survey.

## 2024-06-16 DIAGNOSIS — E7800 Pure hypercholesterolemia, unspecified: Secondary | ICD-10-CM | POA: Diagnosis not present

## 2024-06-16 DIAGNOSIS — F321 Major depressive disorder, single episode, moderate: Secondary | ICD-10-CM | POA: Diagnosis not present

## 2024-06-16 DIAGNOSIS — G20B2 Parkinson's disease with dyskinesia, with fluctuations: Secondary | ICD-10-CM | POA: Diagnosis not present

## 2024-06-17 ENCOUNTER — Other Ambulatory Visit: Payer: Self-pay | Admitting: Neurology

## 2024-06-17 DIAGNOSIS — G20A1 Parkinson's disease without dyskinesia, without mention of fluctuations: Secondary | ICD-10-CM

## 2024-06-24 ENCOUNTER — Other Ambulatory Visit: Payer: Self-pay

## 2024-06-24 ENCOUNTER — Ambulatory Visit: Attending: Neurology | Admitting: Physical Therapy

## 2024-06-24 DIAGNOSIS — R2681 Unsteadiness on feet: Secondary | ICD-10-CM | POA: Insufficient documentation

## 2024-06-24 DIAGNOSIS — R2689 Other abnormalities of gait and mobility: Secondary | ICD-10-CM | POA: Insufficient documentation

## 2024-06-24 DIAGNOSIS — R471 Dysarthria and anarthria: Secondary | ICD-10-CM | POA: Diagnosis not present

## 2024-06-24 DIAGNOSIS — R293 Abnormal posture: Secondary | ICD-10-CM | POA: Insufficient documentation

## 2024-06-24 DIAGNOSIS — G20A1 Parkinson's disease without dyskinesia, without mention of fluctuations: Secondary | ICD-10-CM | POA: Diagnosis not present

## 2024-06-24 DIAGNOSIS — R41841 Cognitive communication deficit: Secondary | ICD-10-CM | POA: Diagnosis not present

## 2024-06-24 NOTE — Therapy (Signed)
 OUTPATIENT PHYSICAL THERAPY NEURO EVALUATION   Patient Name: Jeremy Davidson MRN: [id number] DOB:1951/10/01, 73 y.o., male Today's Date: 06/25/2024   PCP: Regino Slater, MD  REFERRING PROVIDER:   Evonnie Asberry RAMAN, DO    END OF SESSION:  PT End of Session - 06/25/24 1716     Visit Number 1    Number of Visits 7    Date for PT Re-Evaluation 08/09/24    Authorization Type [employer]    PT Start Time 1450    PT Stop Time 1530    PT Time Calculation (min) 40 min    Equipment Utilized During Treatment Gait belt    Activity Tolerance Patient tolerated treatment well    Behavior During Therapy [facility] for tasks assessed/performed          Past Medical History:  Diagnosis Date   Acute metabolic encephalopathy 05/23/2022   viral URI   Blood in stool 2010   Colonoscopy benign repeat 2015   Difficulty walking 05/23/2022   Esophageal dysmotility    Essential hypertension 09/09/2015   Hardening of the aorta (main artery of the heart)    Hyperlipidemia 09/09/2015   Hyponatremia 05/23/2022   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   Esophageal dysmotility    Hardening of the aorta (main artery of the heart)    Acute metabolic encephalopathy 05/23/2022   Difficulty walking 05/23/2022   Hyponatremia 05/23/2022   Parkinson's disease (HCC) 03/17/2017   Essential hypertension 09/09/2015   Hyperlipidemia 09/09/2015    ONSET DATE: 05/30/2024 (MD referral)  REFERRING DIAG: G20.A1 (ICD-10-CM) - Parkinson's disease without dyskinesia or fluctuating manifestations (HCC)   THERAPY DIAG:  Unsteadiness on feet  Other abnormalities of gait and  mobility  Abnormal posture  Rationale for Evaluation and Treatment: Rehabilitation  SUBJECTIVE:                                                                                                                                                                                             SUBJECTIVE STATEMENT:  Not exactly sure why I'm here.  Pt accompanied by: self and significant other  PERTINENT HISTORY: PD, depression, insomnia, hx of back pain, neurogenic orthostatic hypotension, mild dementia due to PD  PAIN:  Are you having pain? Yes: NPRS  scale: varies; 5/10 Pain location: back pain Pain description: sharp Aggravating factors: unsure Relieving factors: unsure  PRECAUTIONS: Fall and Other: issues with dizziness/low blood pressure  RED FLAGS: None   WEIGHT BEARING RESTRICTIONS: No  FALLS: Has patient fallen in last 6 months? Yes. Number of falls 1 fall, some near falls  LIVING ENVIRONMENT: Lives with: lives with their spouse Lives in: House/apartment Stairs: 3 steps to enter; 14 to get to downstairs den Has following equipment at home: None  PLOF: Independent and Leisure: 3x/wk Systems analyst, [employer] Moves 1x/wk, Boxing 1x/wk  PATIENT GOALS: Trying to work on the dizziness, lightheadedness  OBJECTIVE:  Note: Objective measures were completed at Evaluation unless otherwise noted.  VITALS:  117/79  DIAGNOSTIC FINDINGS: NT for this episode  COGNITION: Overall cognitive status: WFL for questions asked in eval; pt does look to wife to answer some questions/delayed responses at times    POSTURE: rounded shoulders, forward head, posterior pelvic tilt, and flexed trunk  Knees flexed in standing LOWER EXTREMITY ROM:     Active  Right Eval Left Eval  Hip flexion    Hip extension    Hip abduction    Hip adduction    Hip internal rotation    Hip external rotation    Knee flexion    Knee extension -10 -20  Ankle dorsiflexion    Ankle plantarflexion    Ankle  inversion    Ankle eversion     (Blank rows = not tested)  LOWER EXTREMITY MMT:    MMT Right Eval Left Eval  Hip flexion    Hip extension    Hip abduction    Hip adduction    Hip internal rotation    Hip external rotation    Knee flexion    Knee extension    Ankle dorsiflexion    Ankle plantarflexion    Ankle inversion    Ankle eversion    (Blank rows = not tested)  BED MOBILITY:  Not tested  TRANSFERS: Sit to stand: Modified independence  Assistive device utilized: None     Stand to sit: Modified independence  Assistive device utilized: None      GAIT: Findings: Gait Characteristics: step through pattern, decreased arm swing- Right, decreased arm swing- Left, decreased step length- Right, decreased step length- Left, and shuffling, Distance walked: 50 ft, Assistive device utilized:None, and Level of assistance: SBA  FUNCTIONAL TESTS:  5 times sit to stand: 8.78 sec Timed up and go (TUG): 8.72 sec 10 meter walk test: NT Mini-BESTest: 19/28 3 M walk backwards:  5.84 sec              TUG cognitive:  12.53 sec (miscounts) 360 turn to R:  13 steps 360 turn to L:  12 steps            [facility] PT Assessment - 06/24/24 1512       Standardized Balance Assessment   Standardized Balance Assessment Mini-BESTest      Mini-BESTest   Sit To Stand Normal: Comes to stand without use of hands and stabilizes independently.    Rise to Toes < 3 s.   1.84 sec   Stand on one leg (left) Moderate: < 20 s   7.75, 6.09 sec   Stand on one leg (right) Moderate: < 20 s   2.78, 10.29   Stand on one leg - lowest score 1    Compensatory Stepping Correction - Forward Normal: Recovers independently with a single, large step (second realignement  is allowed).    Compensatory Stepping Correction - Backward Moderate: More than one step is required to recover equilibrium    Compensatory Stepping Correction - Left Lateral Severe: Falls, or cannot step    Compensatory Stepping Correction - Right Lateral  Severe:  Falls, or cannot step    Stepping Corredtion Lateral - lowest score 0    Stance - Feet together, eyes open, firm surface  Normal: 30s    Stance - Feet together, eyes closed, foam surface  Normal: 30s    Incline - Eyes Closed Normal: Stands independently 30s and aligns with gravity    Change in Gait Speed Moderate: Unable to change walking speed or signs of imbalance    Walk with head turns - Horizontal Moderate: performs head turns with reduction in gait speed.    Walk with pivot turns Normal: Turns with feet close FAST (< 3 steps) with good balance.    Step over obstacles Normal: Able to step over box with minimal change of gait speed and with good balance.    Timed UP & GO with Dual Task Moderate: Dual Task affects either counting OR walking (>10%) when compared to the TUG without Dual Task.    Mini-BEST total score 19                                                                                                               TREATMENT DATE: 06/24/2024    PATIENT EDUCATION: Education details: Eval results, POC; need to address balance strategies for improved stability and decreased fall risk Person educated: Patient and Spouse Education method: Explanation Education comprehension: verbalized understanding  HOME EXERCISE PROGRAM: Not yet initiated  GOALS: Goals reviewed with patient? Yes  SHORT TERM GOALS: Target date: 07/12/2024  Pt will be independent with HEP for improved balance, gait. Baseline: Goal status: INITIAL   LONG TERM GOALS: Target date: 08/09/2024  Pt will be independent with progression of HEP for improved balance, flexibility, gait. Baseline:  Goal status: INITIAL  2.  Pt will improve MiniBESTest score to at least 23/28 to decrease fall risk. Baseline: 19/28 Goal status: INITIAL  3.  Pt will demo improved dual task with gait with <10% difference with TUG/TUG cognitive scores. Baseline:  Goal status: INITIAL  4.  Pt will perform full 360  turns in 8-10 steps, R and L, for improved balance. Baseline: 12-13 steps Goal status: INITIAL    ASSESSMENT:  CLINICAL IMPRESSION: Patient is a 73 y.o. male who was seen today for physical therapy evaluation and treatment for Parkinson's disease.  He was seen in July for PD screen, where return PT was recommended due to a recent fall.  He presents today with hx of low back pain, with abnormal posture, decreased flexibility, bradykinesia, decreased balance.  He particularly demo decreased reactive balance on MiniBESTest, and is at increased fall risk per score of 19/28.  He is active with a personal trainer several days/wk and two other community PD exercise classes.  He would benefit from skilled PT to address  the above stated deficits to decrease his fall risk and improve overall functional mobility.  OBJECTIVE IMPAIRMENTS: Abnormal gait, decreased balance, decreased mobility, difficulty walking, decreased ROM, impaired flexibility, and postural dysfunction.   ACTIVITY LIMITATIONS: standing, transfers, and locomotion level  PARTICIPATION LIMITATIONS: community activity  PERSONAL FACTORS: 3+ comorbidities: see above are also affecting patient's functional outcome.   REHAB POTENTIAL: Good  CLINICAL DECISION MAKING: Evolving/moderate complexity  EVALUATION COMPLEXITY: Moderate  PLAN:  PT FREQUENCY: 1x/week  PT DURATION: 6 weeks plus eval visit  PLANNED INTERVENTIONS: 97750- Physical Performance Testing, 97110-Therapeutic exercises, 97530- Therapeutic activity, 97112- Neuromuscular re-education, 97535- Self Care, 02859- Manual therapy, [phone]- Gait training, Patient/Family education, and Balance training  PLAN FOR NEXT SESSION: Initiate HEP to address postural stability, reactive balance, stepping strategies in varied directions; work on dual tasking, balance, posture   Jansen Goodpasture W., PT 06/25/2024, 5:17 PM  [facility] at [facility] [address], [address] [place], [place] [address] Phone # [phone] Fax # [phone]

## 2024-06-25 ENCOUNTER — Encounter: Payer: Self-pay | Admitting: Physical Therapy

## 2024-07-03 ENCOUNTER — Ambulatory Visit: Admitting: Physical Therapy

## 2024-07-03 ENCOUNTER — Encounter: Payer: Self-pay | Admitting: Physical Therapy

## 2024-07-03 DIAGNOSIS — R2689 Other abnormalities of gait and mobility: Secondary | ICD-10-CM | POA: Diagnosis not present

## 2024-07-03 DIAGNOSIS — G20A1 Parkinson's disease without dyskinesia, without mention of fluctuations: Secondary | ICD-10-CM | POA: Diagnosis not present

## 2024-07-03 DIAGNOSIS — R471 Dysarthria and anarthria: Secondary | ICD-10-CM | POA: Diagnosis not present

## 2024-07-03 DIAGNOSIS — R293 Abnormal posture: Secondary | ICD-10-CM

## 2024-07-03 DIAGNOSIS — R41841 Cognitive communication deficit: Secondary | ICD-10-CM | POA: Diagnosis not present

## 2024-07-03 DIAGNOSIS — R2681 Unsteadiness on feet: Secondary | ICD-10-CM

## 2024-07-03 NOTE — Therapy (Signed)
 OUTPATIENT PHYSICAL THERAPY NEURO TREATMENT NOTE   Patient Name: Jeremy Davidson MRN: [id number] DOB:1951-03-15, 73 y.o., male Today's Date: 07/03/2024   PCP: Regino Slater, MD  REFERRING PROVIDER:   Evonnie Asberry RAMAN, DO    END OF SESSION:  PT End of Session - 07/03/24 0753     Visit Number 2    Number of Visits 7    Date for PT Re-Evaluation 08/09/24    Authorization Type [employer]    PT Start Time 0800    PT Stop Time 0842    PT Time Calculation (min) 42 min    Equipment Utilized During Treatment Gait belt    Activity Tolerance Patient tolerated treatment well    Behavior During Therapy [facility] for tasks assessed/performed          Past Medical History:  Diagnosis Date   Acute metabolic encephalopathy 05/23/2022   viral URI   Blood in stool 2010   Colonoscopy benign repeat 2015   Difficulty walking 05/23/2022   Esophageal dysmotility    Essential hypertension 09/09/2015   Hardening of the aorta (main artery of the heart)    Hyperlipidemia 09/09/2015   Hyponatremia 05/23/2022   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   Esophageal dysmotility    Hardening of the aorta (main artery of the heart)    Acute metabolic encephalopathy 05/23/2022   Difficulty walking 05/23/2022   Hyponatremia 05/23/2022   Parkinson's disease (HCC) 03/17/2017   Essential hypertension 09/09/2015   Hyperlipidemia 09/09/2015    ONSET DATE: 05/30/2024 (MD referral)  REFERRING DIAG: G20.A1 (ICD-10-CM) - Parkinson's disease without dyskinesia or fluctuating manifestations (HCC)   THERAPY DIAG:  Unsteadiness on feet  Other abnormalities of gait and  mobility  Abnormal posture  Rationale for Evaluation and Treatment: Rehabilitation  SUBJECTIVE:                                                                                                                                                                                             SUBJECTIVE STATEMENT: Doing pretty well.  Had one day where I was very dizzy and lightheaded, but it is like that all the time.  Pt accompanied by: self and significant other  PERTINENT HISTORY: PD, depression, insomnia, hx of back pain, neurogenic  orthostatic hypotension, mild dementia due to PD  PAIN:  Are you having pain? Yes: NPRS scale: varies; 5/10 Pain location: back pain Pain description: sharp Aggravating factors: unsure Relieving factors: unsure  PRECAUTIONS: Fall and Other: issues with dizziness/low blood pressure  RED FLAGS: None   WEIGHT BEARING RESTRICTIONS: No  FALLS: Has patient fallen in last 6 months? Yes. Number of falls 1 fall, some near falls  LIVING ENVIRONMENT: Lives with: lives with their spouse Lives in: House/apartment Stairs: 3 steps to enter; 14 to get to downstairs den Has following equipment at home: None  PLOF: Independent and Leisure: 3x/wk Systems analyst, [employer] Moves 1x/wk, Boxing 1x/wk  PATIENT GOALS: Trying to work on the dizziness, lightheadedness  OBJECTIVE:    TODAY'S TREATMENT: 07/03/2024 Activity Comments  NuStep, Level 4/5, x 8 minutes, 4 extremities 2 min warm up, then increased speed intervals  Vitals 102/63 HR 75   Heel toe raises, 3 x 10 (less UE support, then with EC) Wall bump x 10 Minisquat to up on toes x 10 In parallel bars  Step strategy work-forward x 10 over obstacle Side x 10 over obstacle Back x 10 UE support, cues for foot clearance, brief stop and reset at midline  Resisted sit to stand x 5 reps Tactile cues for scapular retraction, glut and quad sets-pt has increased forward lean  Resisted gait 85 ft x 3, then no resistance x  85 ft Cues for upright posture, increased step length, looking ahead at visual targets   HOME EXERCISE PROGRAM: Access Code: [facility] URL: [email or web address] Date: 07/03/2024 Prepared by: [facility] - Outpatient  Rehab - [facility]  Exercises - Alternating Step forward-Balance Reaction  - 1 x daily - 5 x weekly - 2 sets - 10 reps - Alternating Step Backward with Support  - 1 x daily - 5 x weekly - 2 sets - 10 reps - Side Stepping with Counter Support  - 1 x daily - 5 x weekly - 2 sets - 10 reps  PATIENT EDUCATION: Education details: Initiated HEP; attention to posture, step length, foot clearance with gait Person educated: Patient and Spouse Education method: Explanation, Demonstration, and Handouts Education comprehension: verbalized understanding, returned demonstration, and needs further education  -------------------------------------------- Note: Objective measures were completed at Evaluation unless otherwise noted.  VITALS:  117/79  DIAGNOSTIC FINDINGS: NT for this episode  COGNITION: Overall cognitive status: WFL for questions asked in eval; pt does look to wife to answer some questions/delayed responses at times    POSTURE: rounded shoulders, forward head, posterior pelvic tilt, and flexed trunk  Knees flexed in standing LOWER EXTREMITY ROM:     Active  Right Eval Left Eval  Hip flexion    Hip extension    Hip abduction    Hip adduction    Hip internal rotation    Hip external rotation    Knee flexion    Knee extension -10 -20  Ankle dorsiflexion    Ankle plantarflexion    Ankle inversion    Ankle eversion     (Blank rows = not tested)  LOWER EXTREMITY MMT:    MMT Right Eval Left Eval  Hip flexion    Hip extension    Hip abduction    Hip adduction    Hip internal rotation    Hip external rotation    Knee flexion    Knee extension    Ankle dorsiflexion    Ankle plantarflexion    Ankle inversion    Ankle eversion     (  Blank rows = not tested)  BED MOBILITY:  Not tested  TRANSFERS: Sit to stand: Modified independence  Assistive device utilized: None     Stand to sit: Modified independence  Assistive device utilized: None      GAIT: Findings: Gait Characteristics: step through pattern, decreased arm swing- Right, decreased arm swing- Left, decreased step length- Right, decreased step length- Left, and shuffling, Distance walked: 50 ft, Assistive device utilized:None, and Level of assistance: SBA  FUNCTIONAL TESTS:  5 times sit to stand: 8.78 sec Timed up and go (TUG): 8.72 sec 10 meter walk test: NT Mini-BESTest: 19/28 3 M walk backwards:  5.84 sec              TUG cognitive:  12.53 sec (miscounts) 360 turn to R:  13 steps 360 turn to L:  12 steps                                                                                                                   TREATMENT DATE: 06/24/2024    PATIENT EDUCATION: Education details: Eval results, POC; need to address balance strategies for improved stability and decreased fall risk Person educated: Patient and Spouse Education method: Explanation Education comprehension: verbalized understanding  HOME EXERCISE PROGRAM: Not yet initiated  GOALS: Goals reviewed with patient? Yes  SHORT TERM GOALS: Target date: 07/12/2024  Pt will be independent with HEP for improved balance, gait. Baseline: Goal status: INITIAL   LONG TERM GOALS: Target date: 08/09/2024  Pt will be independent with progression of HEP for improved balance, flexibility, gait. Baseline:  Goal status: INITIAL  2.  Pt will improve MiniBESTest score to at least 23/28 to decrease fall risk. Baseline: 19/28 Goal status: INITIAL  3.  Pt will demo improved dual task with gait with <10% difference with TUG/TUG cognitive scores. Baseline:  Goal status: INITIAL  4.  Pt will perform full 360 turns in 8-10 steps, R and L, for improved balance. Baseline: 12-13 steps Goal  status: INITIAL    ASSESSMENT:  CLINICAL IMPRESSION: Pt presents today with no new reports.  He does report dizziness (which is not new), so assessed BP measures.  BP measures WFL and assessed pt throughout session, with pt reporting mild dizziness (at his baseline).  Skilled PT session focused on hip, ankle, step strategy work for balance, in addition to postural awareness with transfers and gait.  With resisted standing and gait, he tends to have increased forward flexed posture and will need additional work for improved glut, quad, scapular retraction activation for posture/balance. Pt needs multi-modal cues throughout. Pt will continue to benefit from skilled PT towards goals for improved functional mobility and decreased fall risk.   OBJECTIVE IMPAIRMENTS: Abnormal gait, decreased balance, decreased mobility, difficulty walking, decreased ROM, impaired flexibility, and postural dysfunction.   ACTIVITY LIMITATIONS: standing, transfers, and locomotion level  PARTICIPATION LIMITATIONS: community activity  PERSONAL FACTORS: 3+ comorbidities: see above are also affecting patient's functional outcome.   REHAB POTENTIAL: Good  CLINICAL DECISION MAKING: Evolving/moderate  complexity  EVALUATION COMPLEXITY: Moderate  PLAN:  PT FREQUENCY: 1x/week  PT DURATION: 6 weeks plus eval visit  PLANNED INTERVENTIONS: 97750- Physical Performance Testing, 97110-Therapeutic exercises, 97530- Therapeutic activity, 97112- Neuromuscular re-education, 97535- Self Care, 02859- Manual therapy, [phone]- Gait training, Patient/Family education, and Balance training  PLAN FOR NEXT SESSION: Review and progress HEP to address postural stability, reactive balance, stepping strategies in varied directions; work on dual tasking, balance, posture; glut/quad activation, postural strengthening   Yumiko Alkins W., PT 07/03/2024, 8:54 AM  [facility] at [facility] [address], [address] [place], [place] [address] Phone # [phone] Fax # [phone]

## 2024-07-09 ENCOUNTER — Ambulatory Visit: Admitting: Physical Therapy

## 2024-07-09 DIAGNOSIS — R471 Dysarthria and anarthria: Secondary | ICD-10-CM | POA: Diagnosis not present

## 2024-07-09 DIAGNOSIS — R2689 Other abnormalities of gait and mobility: Secondary | ICD-10-CM

## 2024-07-09 DIAGNOSIS — G20A1 Parkinson's disease without dyskinesia, without mention of fluctuations: Secondary | ICD-10-CM | POA: Diagnosis not present

## 2024-07-09 DIAGNOSIS — R2681 Unsteadiness on feet: Secondary | ICD-10-CM

## 2024-07-09 DIAGNOSIS — R41841 Cognitive communication deficit: Secondary | ICD-10-CM | POA: Diagnosis not present

## 2024-07-09 DIAGNOSIS — R293 Abnormal posture: Secondary | ICD-10-CM | POA: Diagnosis not present

## 2024-07-09 NOTE — Therapy (Signed)
 OUTPATIENT PHYSICAL THERAPY NEURO TREATMENT NOTE   Patient Name: Jeremy Davidson MRN: [id number] DOB:18-Dec-1950, 73 y.o., male Today's Date: 07/09/2024   PCP: Regino Slater, MD  REFERRING PROVIDER:   Evonnie Asberry RAMAN, DO    END OF SESSION:  PT End of Session - 07/09/24 1536     Visit Number 3    Number of Visits 7    Date for Recertification  08/09/24    Authorization Type [employer]    PT Start Time 1535    PT Stop Time 1617    PT Time Calculation (min) 42 min    Equipment Utilized During Treatment Gait belt    Activity Tolerance Patient tolerated treatment well    Behavior During Therapy 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   Esophageal dysmotility    Hardening of the aorta (main artery of the heart)    Acute metabolic encephalopathy 05/23/2022   Difficulty walking 05/23/2022   Hyponatremia 05/23/2022   Parkinson's disease (HCC) 03/17/2017   Essential hypertension 09/09/2015   Hyperlipidemia 09/09/2015    ONSET DATE: 05/30/2024 (MD referral)  REFERRING DIAG: G20.A1 (ICD-10-CM) - Parkinson's disease without dyskinesia or fluctuating manifestations (HCC)   THERAPY DIAG:  Abnormal posture  Unsteadiness on feet  Other  abnormalities of gait and mobility  Rationale for Evaluation and Treatment: Rehabilitation  SUBJECTIVE:                                                                                                                                                                                             SUBJECTIVE STATEMENT: Back is bothering me a little more today and the last few days.    Pt accompanied by: self and significant other  PERTINENT HISTORY: PD, depression, insomnia, hx of back pain, neurogenic orthostatic hypotension, mild dementia due to  PD  PAIN:  Are you having pain? Yes: NPRS scale: varies; 6-7/10 Pain location: back pain Pain description: sharp Aggravating factors: unsure Relieving factors: unsure  PRECAUTIONS: Fall and Other: issues with dizziness/low blood pressure  RED FLAGS: None   WEIGHT BEARING RESTRICTIONS: No  FALLS: Has patient fallen in last 6 months? Yes. Number of falls 1 fall, some near falls  LIVING ENVIRONMENT: Lives with: lives with their spouse Lives in: House/apartment Stairs: 3 steps to enter; 14 to get to downstairs den Has following equipment at home: None  PLOF: Independent and Leisure: 3x/wk Systems analyst, [employer]! Moves 1x/wk, Boxing 1x/wk  PATIENT GOALS: Trying to work on the dizziness, lightheadedness  OBJECTIVE:   Pt tender to palpation along L low back musculature.   TODAY'S TREATMENT: 07/09/2024 Activity Comments     Forward trunk stretch  Forward/diagonal stretch At physioball, then at chair in front-good stretch, 10-15 sec hold, 3-5 reps  Seated hamstring stretch, 3 x 30 sec Foot propped at 4 block, cues for form  Used tennis balls for self massage at L lumbar musculature Pt feels relief-tried sitting at chair back and standing at wall  Review of HEP:  forward step and weightshift Side step and weightshift Back step and weightshift At counter, PT provides cues for increased amplitude/intention of movement and best posture in  midline  NuStep, Level 4>5, 4 extremities x 7 minutes  Flexibility, strength, keeping SPM >80-90  Standing postural strengthening:  glut sets x 5 reps, 3 sec hold   Seated postural strengthening:  PWR! Up with red theraband  8 reps, visual and verbal cues   *Pt reports no pain at end of session  HOME EXERCISE PROGRAM: Access Code: [email or web address] URL: [email or web address] Date: 07/09/2024 Prepared by: [facility] - Outpatient  Rehab - [facility]  Exercises - Alternating Step forward-Balance Reaction  - 1 x daily - 5 x weekly - 2 sets - 10 reps - Alternating Step Backward with Support  - 1 x daily - 5 x weekly - 2 sets - 10 reps - Side Stepping with Counter Support  - 1 x daily - 5 x weekly - 2 sets - 10 reps - Seated Flexion Stretch with Swiss Ball  - 1-2 x daily - 7 x weekly - 1 sets - 3-5 reps - 15 sec hold - Seated Thoracic Flexion and Rotation with Swiss Ball  - 1-2 x daily - 7 x weekly - 1 sets - 3-5 reps - 15 sec hold - Seated Hamstring stretch  - 1-2 x daily - 7 x weekly - 1 sets - 3 reps - 30 sec hold  PATIENT EDUCATION: Education details: Updated HEP to include stretching to low back and hamstrings; importance of flexibility and stretching program daily for people with PD; discussed use of tennis balls for lumbar muscle massage. Person educated: Patient and Spouse Education method: Explanation, Demonstration, and Handouts Education comprehension: verbalized understanding, returned demonstration, and needs further education  -------------------------------------------- Note: Objective measures were completed at Evaluation unless otherwise noted.  VITALS:  117/79  DIAGNOSTIC FINDINGS: NT for this episode  COGNITION: Overall cognitive status: WFL for questions asked in eval; pt does look to wife to answer some questions/delayed responses at times    POSTURE: rounded shoulders, forward head, posterior pelvic tilt, and flexed trunk  Knees flexed in  standing LOWER EXTREMITY ROM:     Active  Right Eval Left Eval  Hip flexion    Hip extension    Hip abduction  Hip adduction    Hip internal rotation    Hip external rotation    Knee flexion    Knee extension -10 -20  Ankle dorsiflexion    Ankle plantarflexion    Ankle inversion    Ankle eversion     (Blank rows = not tested)  LOWER EXTREMITY MMT:    MMT Right Eval Left Eval  Hip flexion    Hip extension    Hip abduction    Hip adduction    Hip internal rotation    Hip external rotation    Knee flexion    Knee extension    Ankle dorsiflexion    Ankle plantarflexion    Ankle inversion    Ankle eversion    (Blank rows = not tested)  BED MOBILITY:  Not tested  TRANSFERS: Sit to stand: Modified independence  Assistive device utilized: None     Stand to sit: Modified independence  Assistive device utilized: None      GAIT: Findings: Gait Characteristics: step through pattern, decreased arm swing- Right, decreased arm swing- Left, decreased step length- Right, decreased step length- Left, and shuffling, Distance walked: 50 ft, Assistive device utilized:None, and Level of assistance: SBA  FUNCTIONAL TESTS:  5 times sit to stand: 8.78 sec Timed up and go (TUG): 8.72 sec 10 meter walk test: NT Mini-BESTest: 19/28 3 M walk backwards:  5.84 sec              TUG cognitive:  12.53 sec (miscounts) 360 turn to R:  13 steps 360 turn to L:  12 steps                                                                                                                   TREATMENT DATE: 06/24/2024    PATIENT EDUCATION: Education details: Eval results, POC; need to address balance strategies for improved stability and decreased fall risk Person educated: Patient and Spouse Education method: Explanation Education comprehension: verbalized understanding  HOME EXERCISE PROGRAM: Not yet initiated  GOALS: Goals reviewed with patient? Yes  SHORT TERM GOALS: Target date:  07/12/2024  Pt will be independent with HEP for improved balance, gait. Baseline: HEP initiated and updated, 07/09/2024 Goal status: IN PROGRESS 07/09/2024   LONG TERM GOALS: Target date: 08/09/2024  Pt will be independent with progression of HEP for improved balance, flexibility, gait. Baseline:  Goal status: IN PROGRESS  2.  Pt will improve MiniBESTest score to at least 23/28 to decrease fall risk. Baseline: 19/28 Goal status: IN PROGRESS  3.  Pt will demo improved dual task with gait with <10% difference with TUG/TUG cognitive scores. Baseline:  Goal status: IN PROGRESS  4.  Pt will perform full 360 turns in 8-10 steps, R and L, for improved balance. Baseline: 12-13 steps Goal status: IN PROGRESS    ASSESSMENT:  CLINICAL IMPRESSION: Pt presents today with reports of increased low back pain (has history of this, but he reports it has flared some over the past few days).  Skilled PT session focused on incorporating lumbar stretches and hamstring stretches into daily routine.  Also reviewed step strategy work for balance, with pt having 1-2 near LOB without holding support in Left lateral step direction.  Pt needs cues for more deliberate step strategy and best posture upon returning to midline.  He finishes therapy session with no reports of pain.  Pt will continue to benefit from skilled PT towards goals for improved functional mobility and decreased fall risk.   OBJECTIVE IMPAIRMENTS: Abnormal gait, decreased balance, decreased mobility, difficulty walking, decreased ROM, impaired flexibility, and postural dysfunction.   ACTIVITY LIMITATIONS: standing, transfers, and locomotion level  PARTICIPATION LIMITATIONS: community activity  PERSONAL FACTORS: 3+ comorbidities: see above are also affecting patient's functional outcome.   REHAB POTENTIAL: Good  CLINICAL DECISION MAKING: Evolving/moderate complexity  EVALUATION COMPLEXITY: Moderate  PLAN:  PT FREQUENCY:  1x/week  PT DURATION: 6 weeks plus eval visit  PLANNED INTERVENTIONS: 97750- Physical Performance Testing, 97110-Therapeutic exercises, 97530- Therapeutic activity, 97112- Neuromuscular re-education, 97535- Self Care, 02859- Manual therapy, [phone]- Gait training, Patient/Family education, and Balance training  PLAN FOR NEXT SESSION: Review stretches and progress HEP to address postural stability, reactive balance, stepping strategies in varied directions (resisted at [employer]?); work on dual tasking, balance, posture; glut/quad activation, postural strengthening   Jackilyn Umphlett W., PT 07/09/2024, 4:22 PM  [facility] at [facility] [address] [place], [place] [address] Phone # [phone] Fax # [phone]

## 2024-07-11 ENCOUNTER — Ambulatory Visit

## 2024-07-11 ENCOUNTER — Other Ambulatory Visit: Payer: Self-pay

## 2024-07-11 DIAGNOSIS — R2681 Unsteadiness on feet: Secondary | ICD-10-CM | POA: Diagnosis not present

## 2024-07-11 DIAGNOSIS — R471 Dysarthria and anarthria: Secondary | ICD-10-CM | POA: Diagnosis not present

## 2024-07-11 DIAGNOSIS — R41841 Cognitive communication deficit: Secondary | ICD-10-CM

## 2024-07-11 DIAGNOSIS — G20A1 Parkinson's disease without dyskinesia, without mention of fluctuations: Secondary | ICD-10-CM | POA: Diagnosis not present

## 2024-07-11 DIAGNOSIS — R293 Abnormal posture: Secondary | ICD-10-CM | POA: Diagnosis not present

## 2024-07-11 DIAGNOSIS — R2689 Other abnormalities of gait and mobility: Secondary | ICD-10-CM | POA: Diagnosis not present

## 2024-07-11 NOTE — Therapy (Signed)
 OUTPATIENT SPEECH LANGUAGE PATHOLOGY PARKINSON'S EVALUATION   Patient Name: Jeremy Davidson MRN: [id number] DOB:1950-11-20, 73 y.o., male Today's Date: 07/11/2024  PCP: Regino Slater, MD REFERRING PROVIDER: Evonnie Stabs, DO   END OF SESSION:  End of Session - 07/11/24 1558     Visit Number 1    Number of Visits 5    Date for Recertification  09/12/24   due to start date of ST 08/09/24   SLP Start Time 1403    SLP Stop Time  1441    SLP Time Calculation (min) 38 min    Activity Tolerance Patient tolerated treatment well           Past Medical History:  Diagnosis Date   Acute metabolic encephalopathy 05/23/2022   viral URI   Blood in stool 2010   Colonoscopy benign repeat 2015   Difficulty walking 05/23/2022   Esophageal dysmotility    Essential hypertension 09/09/2015   Hardening of the aorta (main artery of the heart)    Hyperlipidemia 09/09/2015   Hyponatremia 05/23/2022   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   Esophageal dysmotility    Hardening of the aorta (main artery of the heart)    Acute metabolic encephalopathy 05/23/2022   Difficulty walking 05/23/2022   Hyponatremia 05/23/2022   Parkinson's disease (HCC) 03/17/2017   Essential hypertension 09/09/2015   Hyperlipidemia 09/09/2015    ONSET DATE: Approx 9 years ago, script dated 05/30/24  REFERRING DIAG:  G20.A1 (ICD-10-CM) - Parkinson's disease without dyskinesia or fluctuating manifestations (HCC)     THERAPY DIAG:  Cognitive communication deficit - Plan: SLP plan of care cert/re-cert  Dysarthria and anarthria - Plan: SLP plan of care  cert/re-cert  Rationale for Evaluation and Treatment: Rehabilitation  SUBJECTIVE:   SUBJECTIVE STATEMENT: I really think I'm doing ok with my volume, I'm not sure why she wanted this.  Pt accompanied by: self  PERTINENT HISTORY: From Dr. Collie note 05/30/24: [name] was seen today in follow up for Parkinsons disease.  My previous records were reviewed prior to todays visit as well as outside records available to me.  Wife supplements history.  Patient was started on donepezil  last visit.  He is tolerating it well.  In regards to hallucinations, he reports that he is doing great.  He has some floaters but no hallucinations.  His BP has been low but he's not so good with the water and you will need to lecture us .    His wife emailed me back in February about [place] and asked about it, but they thought it was in clinical trial, when it was already on the market.  We expressed to them that it was already on the market, but at that time Medicare was not paying for it.  He missed his June appointment when I planned on discussing it further.  He has been to therapy screens and they have requested orders for physical and speech therapy.  1.  Parkinsons Disease             -continue carbidopa /levodopa  25/100, 2 tablet at 6 AM/2 at 10am/2 at 2pm/1 at 6 PM.              -  continue carbidopa /levodopa  50/200 at bed for first AM on             -Will send orders for physical and speech therapy.             -they asked about vyalev.  Discussed r/b/se.  Ultimately, wife states he has sensory issues and doesn't even like to wear a wedding ring.   2.  Insomnia             -no napping after 2 pm   3.  Constipation             -discussed nature and pathophysiology and association with PD             -discussed importance of hydration.  Pt is to increase water intake   4.  Mild dementia             -Doing mental and physical exercises             -neurocog testing in 06/2023 with evidence of mild  dementia             - Continue donepezil , 5 mg daily.  Has helped with hallucination.   5.  Depression             -continue lexapro  10 mg daily   6.  Fatigue             -may be related to #5 but discussed that fatigue is the #1 treatment resistant complaint of Parkinson's patients.   7.  Probably Neurogenic Orthostatic Hypotension              -start midodrine  2.5 mg tid with meals             -raise HOB at night             - Increase water intake.   PAIN:  Are you having pain? No  FALLS: Has patient fallen in last 6 months?  See PT evaluation for details  LIVING ENVIRONMENT: Lives with: lives with their spouse Lives in: House/apartment  PLOF:  Level of assistance: Independent with ADLs, Independent with IADLs Employment: Retired  PATIENT GOALS: Improve communication effectiveness  OBJECTIVE:   COGNITION: Overall cognitive status: Impaired Areas of impairment: Attention, Memory, and Awareness Comments: Pt stated he thought his volume was WNL, he asked wife about vacation plans when asked by SLP, did not recall PT's name he saw two days ago. Cognitive load (processing) mildly impacted fluidity of pt's speech today.   MOTOR SPEECH: Overall motor speech: impaired Level of impairment: Phrase Respiration: thoracic breathing and diaphragmatic/abdominal breathing Phonation: low vocal intensity - 67dB in conversation Resonance: WFL Articulation: Appears intact Intelligibility: intelligible in the quiet ST room, however if any background noise pt would not be understood Motor planning: Appears intact Effective technique: increased vocal intensity  ORAL MOTOR EXAMINATION: Overall status: Impaired: Labial: Bilateral (ROM and Coordination) Lingual: Bilateral (ROM and Coordination) Facial: Bilateral (ROM) Comments: labial and lingual ROM better after SLP model  OBJECTIVE VOICE ASSESSMENT: Sustained ah maximum phonation time: 6.8 seconds (below WNL) Conversational  loudness average: 54-75 dB Conversational loudness range: 67 dB Voice quality: breathy, low vocal intensity, and aphonic Stimulability trials: Given SLP modeling and usual mod cues, loudness average increased to 70dB (range of 66 to 75) with a short conversational monologue (25 seconds).  Pt does report difficulty with coughing x3/day on saliva, and occasional coughing with meals/liquids; Pt's swallowing may warrant further evaluation. A bedside swallow eval  may be initiated and a MBS may be warranted in this therapy course.  PATIENT REPORTED OUTCOME MEASURES (PROM): Communication Effectiveness Survey: to be administered during first  session  TODAY'S TREATMENT:                                                                                                                                         DATE:   07/11/24: Initially Pat declined ST, however, agreed to x1/week x6 weeks ST after the evaluation when SLP included pt's wife in the session today, and collaborated with pt and wife about how to structure ST to benefit pt most. It was agreed that Arland would join sessions in order to learn things to perform at home so that pt can remain as intelligible as possible in that setting, and learn some good cues when she and pt are out of the home to assist listener's comprehension of Pat's message. SLP will also work with [name] in session to improve muscle memory for louder (WNL) speech. SLP may also include dysphagia tasks such as clinical swallow eval, and/or MBS. Pat agreed to x1/week x6 weeks.    PATIENT EDUCATION: Education details: see today's treatment Person educated: Patient Education method: Explanation, Demonstration, and Verbal cues Education comprehension: verbalized understanding, returned demonstration, verbal cues required, and needs further education  HOME EXERCISE PROGRAM: Eventually, HEP for louder speech   GOALS: Goals reviewed with patient? No  SHORT TERM GOALS: =LONG TERM  GOALS: Target date: 09/09/24 (due to first ST 08/12/24)  Pt will undergo MBS if clinically indicated Baseline:  Goal status: INITIAL  2.  Pt will produce 5 minutes conversation with average volume 69dB given occasional nonverbal cues over 2 sessions Baseline:  Goal status: INITIAL  3.  To improve Pat's intelligibility at home and in the community, Arland will demo successful verbal and/or nonverbal cues that improve pt's speech volume in or between 2 sessions (as reported by pt or Arland) Baseline:  Goal status: INITIAL  4.  Pt's PROM will show improvement from initial administration  Baseline:  Goal status: INITIAL   5. Pt will tell SLP 2 memory strategies with mod I    Baseline:   Goal status: INITIAL  ASSESSMENT:  CLINICAL IMPRESSION: Patient is a 73y.o. M who was seen today for assessment of speech skills in light of his Parkinson disease diagnosed approx 9 years ago. Initially h denied having difficulty with speech volume but when SLP recorded pt speaking he agreed he was speaking quietly with a weak voice. He states occasional difficulty with meals/liquids - SLP may perform clinical swallow eval and may recommend MBS if clinically indicated (LTG #1). Memory stragegies will be provided and pt will use PRN (likely with cues from [place]).  OBJECTIVE IMPAIRMENTS: Objective impairments include attention, memory, dysarthria, and dysphagia. These impairments are limiting patient from managing medications, managing appointments, managing finances, household responsibilities, ADLs/IADLs, effectively communicating at  home and in community, and safety when swallowing.Factors affecting potential to achieve goals and functional outcome are ability to learn/carryover information, cooperation/participation level, and previous level of function.. Patient will benefit from skilled SLP services to address above impairments and improve overall function.  REHAB POTENTIAL: Good  PLAN:  SLP FREQUENCY:  1x/week  SLP DURATION: 4 weeks  PLANNED INTERVENTIONS: Aspiration precaution training, Pharyngeal strengthening exercises, Diet toleration management , Environmental controls, Trials of upgraded texture/liquids, Cueing hierachy, Cognitive reorganization, Internal/external aids, Functional tasks, SLP instruction and feedback, Compensatory strategies, Patient/family education, and MBS (possible)    Terence Bart, CCC-SLP 07/11/2024, 3:58 PM

## 2024-07-15 ENCOUNTER — Ambulatory Visit: Admitting: Physical Therapy

## 2024-07-15 ENCOUNTER — Encounter: Payer: Self-pay | Admitting: Physical Therapy

## 2024-07-15 DIAGNOSIS — R293 Abnormal posture: Secondary | ICD-10-CM | POA: Diagnosis not present

## 2024-07-15 DIAGNOSIS — R2689 Other abnormalities of gait and mobility: Secondary | ICD-10-CM

## 2024-07-15 DIAGNOSIS — R41841 Cognitive communication deficit: Secondary | ICD-10-CM | POA: Diagnosis not present

## 2024-07-15 DIAGNOSIS — R471 Dysarthria and anarthria: Secondary | ICD-10-CM | POA: Diagnosis not present

## 2024-07-15 DIAGNOSIS — R2681 Unsteadiness on feet: Secondary | ICD-10-CM | POA: Diagnosis not present

## 2024-07-15 DIAGNOSIS — G20A1 Parkinson's disease without dyskinesia, without mention of fluctuations: Secondary | ICD-10-CM | POA: Diagnosis not present

## 2024-07-15 NOTE — Therapy (Signed)
 OUTPATIENT PHYSICAL THERAPY NEURO TREATMENT NOTE   Patient Name: Jeremy Davidson MRN: [id number] DOB:[birth year], 73 y.o., male Today's Date: 07/15/2024   PCP: Regino Slater, MD  REFERRING PROVIDER:   Evonnie Asberry RAMAN, DO    END OF SESSION:  PT End of Session - 07/15/24 1623     Visit Number 4    Number of Visits 7    Date for Recertification  08/09/24    Authorization Type [employer]    PT Start Time 1622    PT Stop Time 1702    PT Time Calculation (min) 40 min    Equipment Utilized During Treatment Gait belt    Activity Tolerance Patient tolerated treatment well    Behavior During Therapy [facility] for tasks assessed/performed            Past Medical History:  Diagnosis Date   Acute metabolic encephalopathy 05/23/2022   viral URI   Blood in stool 2010   Colonoscopy benign repeat 2015   Difficulty walking 05/23/2022   Esophageal dysmotility    Essential hypertension 09/09/2015   Hardening of the aorta (main artery of the heart)    Hyperlipidemia 09/09/2015   Hyponatremia 05/23/2022   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   Esophageal dysmotility    Hardening of the aorta (main artery of the heart)    Acute metabolic encephalopathy 05/23/2022   Difficulty walking 05/23/2022   Hyponatremia 05/23/2022   Parkinson's disease (HCC) 03/17/2017   Essential hypertension 09/09/2015   Hyperlipidemia 09/09/2015    ONSET DATE: 05/30/2024 (MD referral)  REFERRING DIAG: G20.A1 (ICD-10-CM) - Parkinson's disease without dyskinesia or fluctuating manifestations (HCC)   THERAPY DIAG:  Unsteadiness on feet  Other abnormalities of gait  and mobility  Abnormal posture  Rationale for Evaluation and Treatment: Rehabilitation  SUBJECTIVE:                                                                                                                                                                                             SUBJECTIVE STATEMENT: Back is a lot better.  Pt accompanied by: self and significant other  PERTINENT HISTORY: PD, depression, insomnia, hx of back pain, neurogenic orthostatic hypotension, mild dementia due to PD  PAIN:  Are you having pain? Yes:  NPRS scale: varies; no/10 Pain location: back pain Pain description: sharp Aggravating factors: unsure Relieving factors: unsure  PRECAUTIONS: Fall and Other: issues with dizziness/low blood pressure  RED FLAGS: None   WEIGHT BEARING RESTRICTIONS: No  FALLS: Has patient fallen in last 6 months? Yes. Number of falls 1 fall, some near falls  LIVING ENVIRONMENT: Lives with: lives with their spouse Lives in: House/apartment Stairs: 3 steps to enter; 14 to get to downstairs den Has following equipment at home: None  PLOF: Independent and Leisure: 3x/wk Systems analyst, [employer]! Moves 1x/wk, Boxing 1x/wk  PATIENT GOALS: Trying to work on the dizziness, lightheadedness  OBJECTIVE:     TODAY'S TREATMENT: 07/15/2024 Activity Comments  Review of HEP addition last visit: Forward trunk stretch  Seated hamstring stretch Min cues for technique  Standing low back stretch at counter, 3 reps   Seated postural strengthening:  PWR! Up with red theraband  Then standing 5 reps   5 reps  Resisted forward and back gait at weightrack 20#>15#, 4 reps each direction with min guard and max cues for posture, step length and eccentric control  Gait with no resistance, but cues to increase effort as if walking against resistance, forward gait x 50 ft, 4 reps Cues for posture, arm swing  Minisquats to upon toes, lifting weighted ball, 10 reps, then 10 reps standing on  Airex   Standing on Airex:  feet apart EO/EC 30 sec,  marching in place x 10, forward step taps x 10,  back step taps x 10,  side step taps x 10   Cues for foot clearance  On solid ground:  forward step strategy-over obstacle x 10 Side step strategy-over obstacle x 10   Practice 360 turns R and L, multiple reps Cues for side step together turn, able to complete in 8 steps     HOME EXERCISE PROGRAM: Access Code: [facility] URL: [email or web address] Date: 07/09/2024 Prepared by: [facility] - Outpatient  Rehab - [facility]  Exercises - Alternating Step forward-Balance Reaction  - 1 x daily - 5 x weekly - 2 sets - 10 reps - Alternating Step Backward with Support  - 1 x daily - 5 x weekly - 2 sets - 10 reps - Side Stepping with Counter Support  - 1 x daily - 5 x weekly - 2 sets - 10 reps - Seated Flexion Stretch with Swiss Ball  - 1-2 x daily - 7 x weekly - 1 sets - 3-5 reps - 15 sec hold - Seated Thoracic Flexion and Rotation with Swiss Ball  - 1-2 x daily - 7 x weekly - 1 sets - 3-5 reps - 15 sec hold - Seated Hamstring stretch  - 1-2 x daily - 7 x weekly - 1 sets - 3 reps - 30 sec hold  PATIENT EDUCATION: Education details: Continue current HEP Person educated: Patient and Spouse Education method: Explanation, Demonstration, and Handouts Education comprehension: verbalized understanding, returned demonstration, and needs further education  -------------------------------------------- Note: Objective measures were completed at Evaluation unless otherwise noted.  VITALS:  117/79  DIAGNOSTIC FINDINGS: NT for this episode  COGNITION: Overall cognitive status: WFL for questions asked in eval; pt does look to wife to answer some questions/delayed responses at times    POSTURE: rounded shoulders, forward head, posterior pelvic tilt, and flexed trunk  Knees flexed in standing LOWER EXTREMITY ROM:     Active  Right Eval Left Eval  Hip flexion    Hip extension     Hip  abduction    Hip adduction    Hip internal rotation    Hip external rotation    Knee flexion    Knee extension -10 -20  Ankle dorsiflexion    Ankle plantarflexion    Ankle inversion    Ankle eversion     (Blank rows = not tested)  LOWER EXTREMITY MMT:    MMT Right Eval Left Eval  Hip flexion    Hip extension    Hip abduction    Hip adduction    Hip internal rotation    Hip external rotation    Knee flexion    Knee extension    Ankle dorsiflexion    Ankle plantarflexion    Ankle inversion    Ankle eversion    (Blank rows = not tested)  BED MOBILITY:  Not tested  TRANSFERS: Sit to stand: Modified independence  Assistive device utilized: None     Stand to sit: Modified independence  Assistive device utilized: None      GAIT: Findings: Gait Characteristics: step through pattern, decreased arm swing- Right, decreased arm swing- Left, decreased step length- Right, decreased step length- Left, and shuffling, Distance walked: 50 ft, Assistive device utilized:None, and Level of assistance: SBA  FUNCTIONAL TESTS:  5 times sit to stand: 8.78 sec Timed up and go (TUG): 8.72 sec 10 meter walk test: NT Mini-BESTest: 19/28 3 M walk backwards:  5.84 sec              TUG cognitive:  12.53 sec (miscounts) 360 turn to R:  13 steps 360 turn to L:  12 steps                                                                                                                   TREATMENT DATE: 06/24/2024    PATIENT EDUCATION: Education details: Eval results, POC; need to address balance strategies for improved stability and decreased fall risk Person educated: Patient and Spouse Education method: Explanation Education comprehension: verbalized understanding  HOME EXERCISE PROGRAM: Not yet initiated  GOALS: Goals reviewed with patient? Yes  SHORT TERM GOALS: Target date: 07/12/2024  Pt will be independent with HEP for improved balance, gait. Baseline: HEP initiated and  updated, 07/09/2024 Goal status: IN PROGRESS 07/09/2024   LONG TERM GOALS: Target date: 08/09/2024  Pt will be independent with progression of HEP for improved balance, flexibility, gait. Baseline:  Goal status: IN PROGRESS  2.  Pt will improve MiniBESTest score to at least 23/28 to decrease fall risk. Baseline: 19/28 Goal status: IN PROGRESS  3.  Pt will demo improved dual task with gait with <10% difference with TUG/TUG cognitive scores. Baseline:  Goal status: IN PROGRESS  4.  Pt will perform full 360 turns in 8-10 steps, R and L, for improved balance. Baseline: 12-13 steps Goal status: IN PROGRESS    ASSESSMENT:  CLINICAL IMPRESSION: Pt presents today with no reports of back pain, desiring to work on balance. Skilled PT session focused on posture and balance exercises,  with use of resistance (at weighted rack) for forward/backward stepping.  Pt does have difficulty with eccentric control returning to rack, despite max cues.  Worked on varied surfaces/obstacles for step strategy work, with pt needing cues occasionally for foot clearance.  He will continue to benefit from skilled PT towards goals for improved functional mobility and decreased fall risk.    OBJECTIVE IMPAIRMENTS: Abnormal gait, decreased balance, decreased mobility, difficulty walking, decreased ROM, impaired flexibility, and postural dysfunction.   ACTIVITY LIMITATIONS: standing, transfers, and locomotion level  PARTICIPATION LIMITATIONS: community activity  PERSONAL FACTORS: 3+ comorbidities: see above are also affecting patient's functional outcome.   REHAB POTENTIAL: Good  CLINICAL DECISION MAKING: Evolving/moderate complexity  EVALUATION COMPLEXITY: Moderate  PLAN:  PT FREQUENCY: 1x/week  PT DURATION: 6 weeks plus eval visit  PLANNED INTERVENTIONS: 97750- Physical Performance Testing, 97110-Therapeutic exercises, 97530- Therapeutic activity, 97112- Neuromuscular re-education, 97535- Self Care,  97140- Manual therapy, [id number]- Gait training, Patient/Family education, and Balance training  PLAN FOR NEXT SESSION: continue to progress HEP to address postural stability, reactive balance, stepping strategies in varied directions (resisted at weightrack again?); work on dual tasking, balance, posture; glut/quad activation, postural strengthening.  Need to discuss POC, more appts Jeremy STARLET NO W., PT 07/15/2024, 5:19 PM  [facility] at [facility] [address] [place], [address] [place], [place] [address] Phone # [phone] Fax # [phone]

## 2024-07-16 DIAGNOSIS — F321 Major depressive disorder, single episode, moderate: Secondary | ICD-10-CM | POA: Diagnosis not present

## 2024-07-16 DIAGNOSIS — G20B2 Parkinson's disease with dyskinesia, with fluctuations: Secondary | ICD-10-CM | POA: Diagnosis not present

## 2024-07-16 DIAGNOSIS — E7800 Pure hypercholesterolemia, unspecified: Secondary | ICD-10-CM | POA: Diagnosis not present

## 2024-07-18 ENCOUNTER — Ambulatory Visit

## 2024-07-22 ENCOUNTER — Ambulatory Visit: Attending: Neurology | Admitting: Physical Therapy

## 2024-07-22 ENCOUNTER — Encounter: Payer: Self-pay | Admitting: Physical Therapy

## 2024-07-22 DIAGNOSIS — R471 Dysarthria and anarthria: Secondary | ICD-10-CM | POA: Insufficient documentation

## 2024-07-22 DIAGNOSIS — R2681 Unsteadiness on feet: Secondary | ICD-10-CM | POA: Insufficient documentation

## 2024-07-22 DIAGNOSIS — R41841 Cognitive communication deficit: Secondary | ICD-10-CM | POA: Insufficient documentation

## 2024-07-22 DIAGNOSIS — R293 Abnormal posture: Secondary | ICD-10-CM | POA: Diagnosis not present

## 2024-07-22 DIAGNOSIS — R2689 Other abnormalities of gait and mobility: Secondary | ICD-10-CM | POA: Insufficient documentation

## 2024-07-22 NOTE — Therapy (Signed)
 OUTPATIENT PHYSICAL THERAPY NEURO TREATMENT NOTE/DISCHARGE SUMMARY   Patient Name: Jeremy Davidson MRN: [id number] DOB:1951-01-13, 73 y.o., male Today's Date: 07/22/2024   PCP: Regino Slater, MD  REFERRING PROVIDER:   Tat, Asberry RAMAN, DO   PHYSICAL THERAPY DISCHARGE SUMMARY  Visits from Start of Care: 5  Current functional level related to goals / functional outcomes: See below-pt has met 3 of 4 LTGs   Remaining deficits: High level balance, posture   Education / Equipment: Educated in HEP   Patient agrees to discharge. Patient goals were partially met. Patient is being discharged due to being pleased with the current functional level. Recommend PT return screen in 6-9 months.  END OF SESSION:  PT End of Session - 07/22/24 1100     Visit Number 5    Number of Visits 7    Date for Recertification  08/09/24    Authorization Type [employer] Medicare    PT Start Time 1102    PT Stop Time 1140    PT Time Calculation (min) 38 min    Equipment Utilized During Treatment --    Activity Tolerance Patient tolerated treatment well    Behavior During Therapy [facility] for tasks assessed/performed             Past Medical History:  Diagnosis Date   Acute metabolic encephalopathy 05/23/2022   viral URI   Blood in stool 2010   Colonoscopy benign repeat 2015   Difficulty walking 05/23/2022   Esophageal dysmotility    Essential hypertension 09/09/2015   Hardening of the aorta (main artery of the heart)    Hyperlipidemia 09/09/2015   Hyponatremia 05/23/2022   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   Esophageal dysmotility    Hardening of the  aorta (main artery of the heart)    Acute metabolic encephalopathy 05/23/2022   Difficulty walking 05/23/2022   Hyponatremia 05/23/2022   Parkinson's disease (HCC) 03/17/2017   Essential hypertension 09/09/2015   Hyperlipidemia 09/09/2015    ONSET DATE: 05/30/2024 (MD referral)  REFERRING DIAG: G20.A1 (ICD-10-CM) - Parkinson's disease without dyskinesia or fluctuating manifestations (HCC)   THERAPY DIAG:  Unsteadiness on feet  Other abnormalities of gait and mobility  Abnormal posture  Rationale for Evaluation and Treatment: Rehabilitation  SUBJECTIVE:  SUBJECTIVE STATEMENT: Same old, same old.  Think I've improved so far.   Pt accompanied by: self and significant other  PERTINENT HISTORY: PD, depression, insomnia, hx of back pain, neurogenic orthostatic hypotension, mild dementia due to PD  PAIN:  Are you having pain? Yes: NPRS scale: varies; no/10 Pain location: back pain Pain description: sharp Aggravating factors: unsure Relieving factors: unsure  PRECAUTIONS: Fall and Other: issues with dizziness/low blood pressure  RED FLAGS: None   WEIGHT BEARING RESTRICTIONS: No  FALLS: Has patient fallen in last 6 months? Yes. Number of falls 1 fall, some near falls  LIVING ENVIRONMENT: Lives with: lives with their spouse Lives in: House/apartment Stairs: 3 steps to enter; 14 to get to downstairs den Has following equipment at home: None  PLOF: Independent and Leisure: 3x/wk Systems analyst, [employer]! Moves 1x/wk, Boxing 1x/wk  PATIENT GOALS: Trying to work on the dizziness, lightheadedness  OBJECTIVE:    TODAY'S TREATMENT: 07/22/2024 Activity Comments  FTSTS:  8.22 sec   TUG 10.9 sec TUG cognitive:  11.82 sec   55M:  9.97 sec (3.29 ft/sec)   3 M walk backwards:  3.5 sec    MiniBEStest: 24/28 Improved from 19/28  360 R:  10 steps  360 L:  9 steps Improved from 12-13 steps  Posture exercises: seated horizontal adduction x 10 Scapular retraction x 10 Standing shoulder extension x 10 Red band  Gait 50 ft x 8 reps Cues for posture, step length, foot clearance and arm swing-rates effort as 3-4/10  Review of stepping exercises in HEP Performed at chair, good return demo      HOME EXERCISE PROGRAM: Access Code: [facility] URL: [email or web address] Date: 07/09/2024 Prepared by: [facility] - Outpatient  Rehab - [facility]  Exercises - Alternating Step forward-Balance Reaction  - 1 x daily - 5 x weekly - 2 sets - 10 reps - Alternating Step Backward with Support  - 1 x daily - 5 x weekly - 2 sets - 10 reps - Side Stepping with Counter Support  - 1 x daily - 5 x weekly - 2 sets - 10 reps - Seated Flexion Stretch with Swiss Ball  - 1-2 x daily - 7 x weekly - 1 sets - 3-5 reps - 15 sec hold - Seated Thoracic Flexion and Rotation with Swiss Ball  - 1-2 x daily - 7 x weekly - 1 sets - 3-5 reps - 15 sec hold - Seated Hamstring stretch  - 1-2 x daily - 7 x weekly - 1 sets - 3 reps - 30 sec hold  PATIENT EDUCATION: Education details: Progress towards goals, POC and plans for d/c today; importance of optimal posture, increased effort with gait-arm swing, posture, foot clearance Person educated: Patient and Spouse Education method: Explanation, Demonstration, and Handouts Education comprehension: verbalized understanding, returned demonstration, and needs further education  -------------------------------------------- Note: Objective measures were completed at Evaluation unless otherwise noted.  VITALS:  117/79  DIAGNOSTIC FINDINGS: NT for this episode  COGNITION: Overall cognitive status: WFL for questions asked in eval; pt does look to wife to answer some questions/delayed responses at times    POSTURE: rounded shoulders, forward head,  posterior pelvic tilt, and flexed trunk  Knees flexed in standing LOWER EXTREMITY ROM:     Active  Right Eval Left Eval  Hip flexion    Hip extension    Hip abduction    Hip adduction    Hip internal rotation    Hip external rotation    Knee flexion  Knee extension -10 -20  Ankle dorsiflexion    Ankle plantarflexion    Ankle inversion    Ankle eversion     (Blank rows = not tested)  LOWER EXTREMITY MMT:    MMT Right Eval Left Eval  Hip flexion    Hip extension    Hip abduction    Hip adduction    Hip internal rotation    Hip external rotation    Knee flexion    Knee extension    Ankle dorsiflexion    Ankle plantarflexion    Ankle inversion    Ankle eversion    (Blank rows = not tested)  BED MOBILITY:  Not tested  TRANSFERS: Sit to stand: Modified independence  Assistive device utilized: None     Stand to sit: Modified independence  Assistive device utilized: None      GAIT: Findings: Gait Characteristics: step through pattern, decreased arm swing- Right, decreased arm swing- Left, decreased step length- Right, decreased step length- Left, and shuffling, Distance walked: 50 ft, Assistive device utilized:None, and Level of assistance: SBA  FUNCTIONAL TESTS:  5 times sit to stand: 8.78 sec Timed up and go (TUG): 8.72 sec 10 meter walk test: NT Mini-BESTest: 19/28 3 M walk backwards:  5.84 sec              TUG cognitive:  12.53 sec (miscounts) 360 turn to R:  13 steps 360 turn to L:  12 steps            [facility] PT Assessment - 07/22/24 1111       Standardized Balance Assessment   Standardized Balance Assessment Mini-BESTest      Mini-BESTest   Sit To Stand Normal: Comes to stand without use of hands and stabilizes independently.    Rise to Toes Normal: Stable for 3 s with maximum height.    Stand on one leg (left) Moderate: < 20 s   1.88 sed, 6.25   Stand on one leg (right) Moderate: < 20 s   4.63, 3.22   Stand on one leg - lowest score 1     Compensatory Stepping Correction - Forward Normal: Recovers independently with a single, large step (second realignement is allowed).    Compensatory Stepping Correction - Backward Moderate: More than one step is required to recover equilibrium    Compensatory Stepping Correction - Left Lateral Moderate: Several steps to recover equilibrium    Compensatory Stepping Correction - Right Lateral Moderate: Several steps to recover equilibrium    Stepping Corredtion Lateral - lowest score 1    Stance - Feet together, eyes open, firm surface  Normal: 30s    Stance - Feet together, eyes closed, foam surface  Normal: 30s    Incline - Eyes Closed Normal: Stands independently 30s and aligns with gravity    Change in Gait Speed Normal: Significantly changes walkling speed without imbalance    Walk with head turns - Horizontal Normal: performs head turns with no change in gait speed and good balance    Walk with pivot turns Normal: Turns with feet close FAST (< 3 steps) with good balance.    Step over obstacles Normal: Able to step over box with minimal change of gait speed and with good balance.    Timed UP & GO with Dual Task Moderate: Dual Task affects either counting OR walking (>10%) when compared to the TUG without Dual Task.    Mini-BEST total score 24  TREATMENT DATE: 06/24/2024    PATIENT EDUCATION: Education details: Eval results, POC; need to address balance strategies for improved stability and decreased fall risk Person educated: Patient and Spouse Education method: Explanation Education comprehension: verbalized understanding  HOME EXERCISE PROGRAM: Not yet initiated  GOALS: Goals reviewed with patient? Yes  SHORT TERM GOALS: Target date: 07/12/2024  Pt will be independent with HEP for improved balance, gait. Baseline: HEP initiated and updated, 07/09/2024 Goal status: MET  07/22/2024   LONG TERM GOALS: Target date: 08/09/2024  Pt will be independent with progression of HEP for improved balance, flexibility, gait. Baseline:  Goal status: MET, 07/22/2024  2.  Pt will improve MiniBESTest score to at least 23/28 to decrease fall risk. Baseline: 19/28>24/28 07/22/2024 Goal status: MET, 07/22/2024  3.  Pt will demo improved dual task with gait with <10% difference with TUG/TUG cognitive scores. Baseline:  Goal status: NOT MET, 07/22/2024  4.  Pt will perform full 360 turns in 8-10 steps, R and L, for improved balance. Baseline: 12-13 steps; 9-10 steps 07/22/2024 Goal status: MET 07/22/2024   ASSESSMENT:  CLINICAL IMPRESSION: Pt presents today with no new complaints and reports he is feeling good, like he is ready to discharge today. Skilled PT session focused on assessing LTGs and reviewing HEP.   Pt has met 3 of 4 LTGs.  He has improved MiniBESTest, 360 turn scores, demonstrating decreased fall risk.  He has improved TUG/TUG cognitive, just not to goal level. Reiterated importance of postural exercises and attention to increased effort level with gait activities and balance recovery; he has a trainer that he feels can continue to work with him on these things.  He is appropriate for discharge at this time.  OBJECTIVE IMPAIRMENTS: Abnormal gait, decreased balance, decreased mobility, difficulty walking, decreased ROM, impaired flexibility, and postural dysfunction.   ACTIVITY LIMITATIONS: standing, transfers, and locomotion level  PARTICIPATION LIMITATIONS: community activity  PERSONAL FACTORS: 3+ comorbidities: see above are also affecting patient's functional outcome.   REHAB POTENTIAL: Good  CLINICAL DECISION MAKING: Evolving/moderate complexity  EVALUATION COMPLEXITY: Moderate  PLAN:  PT FREQUENCY: 1x/week  PT DURATION: 6 weeks plus eval visit  PLANNED INTERVENTIONS: 97750- Physical Performance Testing, 97110-Therapeutic exercises, 97530-  Therapeutic activity, 97112- Neuromuscular re-education, 97535- Self Care, 02859- Manual therapy, [phone]- Gait training, Patient/Family education, and Balance training  PLAN FOR NEXT SESSION: Discharge this visit; plan for return PD screens in 6-9 months (to be scheduled once speech finishes)  Beonca Gibb W., PT 07/22/2024, 11:46 AM  [facility] at [facility] [address], [address] [place], [place] [address] Phone # [phone] Fax # [phone]

## 2024-07-25 ENCOUNTER — Ambulatory Visit: Admitting: Physical Therapy

## 2024-08-02 ENCOUNTER — Other Ambulatory Visit: Payer: Self-pay | Admitting: Neurology

## 2024-08-02 DIAGNOSIS — F330 Major depressive disorder, recurrent, mild: Secondary | ICD-10-CM

## 2024-08-12 ENCOUNTER — Ambulatory Visit

## 2024-08-12 DIAGNOSIS — R2681 Unsteadiness on feet: Secondary | ICD-10-CM | POA: Diagnosis not present

## 2024-08-12 DIAGNOSIS — R471 Dysarthria and anarthria: Secondary | ICD-10-CM | POA: Diagnosis not present

## 2024-08-12 DIAGNOSIS — R41841 Cognitive communication deficit: Secondary | ICD-10-CM | POA: Diagnosis not present

## 2024-08-12 DIAGNOSIS — R293 Abnormal posture: Secondary | ICD-10-CM | POA: Diagnosis not present

## 2024-08-12 DIAGNOSIS — R2689 Other abnormalities of gait and mobility: Secondary | ICD-10-CM | POA: Diagnosis not present

## 2024-08-12 NOTE — Patient Instructions (Signed)

## 2024-08-12 NOTE — Therapy (Addendum)
 OUTPATIENT SPEECH LANGUAGE PATHOLOGY [facility]'S [facility]REATMENT   Patient Name: Jeremy Davidson MRN: [id number] DOB:[birth year], 73 y.o., male Today's Date: 08/12/2024  PCP: Jeremy Slater, MD REFERRING PROVIDER: Evonnie Stabs, DO   END OF SESSION:  End of Session - 08/12/24 1451     Visit Number 2    Number of Visits 5    Date for Recertification  09/12/24   due to start date of ST 08/09/24   SLP Start Time 1450    SLP Stop Time  1530    SLP Time Calculation (min) 40 min    Activity Tolerance Patient tolerated treatment well            Past Medical History:  Diagnosis Date   Acute metabolic encephalopathy 05/23/2022   viral URI   Blood in stool 2010   Colonoscopy benign repeat 2015   Difficulty walking 05/23/2022   Esophageal dysmotility    Essential hypertension 09/09/2015   Hardening of the aorta (main artery of the heart)    Hyperlipidemia 09/09/2015   Hyponatremia 05/23/2022   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   Esophageal dysmotility    Hardening of the aorta (main artery of the heart)    Acute metabolic encephalopathy 05/23/2022   Difficulty walking 05/23/2022   Hyponatremia 05/23/2022   Parkinson's disease (HCC) 03/17/2017   Essential hypertension 09/09/2015   Hyperlipidemia 09/09/2015    ONSET DATE: Approx 9 years ago, script dated 05/30/24  REFERRING DIAG:  G20.A1 (ICD-10-CM) - Parkinson's disease without dyskinesia or fluctuating manifestations (HCC)     THERAPY DIAG:  Dysarthria and anarthria  Cognitive communication deficit  Rationale for Evaluation and Treatment: Rehabilitation  SUBJECTIVE:    SUBJECTIVE STATEMENT: He does have problems with his memory so if you can go over memory tips that would be good. Jeremy Davidson, and pt agreed)  Pt accompanied by: self  PERTINENT HISTORY: From Jeremy Davidson note 05/30/24: Jeremy Davidson was seen today in follow up for Parkinsons disease.  My previous records were reviewed prior to todays visit as well as outside records available to me.  Wife supplements history.  Patient was started on donepezil  last visit.  He is tolerating it well.  In regards to hallucinations, he reports that he is doing great.  He has some floaters but no hallucinations.  His BP has been low but he's not so good with the water and you will need to lecture us .    His wife emailed me back in February about [name] and asked about it, but they thought it was in clinical trial, when it was already on the market.  We expressed to them that it was already on the market, but at that time Medicare was not paying for it.  He missed his June appointment when I planned on discussing it further.  He has been to therapy screens and they have requested orders for physical and speech therapy.  1.  Parkinsons Disease             -continue carbidopa /levodopa  25/100, 2 tablet at 6 AM/2 at 10am/2 at 2pm/1 at 6 PM.              -  continue carbidopa /levodopa  50/200 at bed for first AM on             -Will send orders for physical and speech therapy.             -they asked about vyalev.  Discussed r/b/se.  Ultimately, wife states he has sensory issues and doesn't even like to wear a wedding ring.   2.  Insomnia             -no napping after 2 pm   3.  Constipation             -discussed nature and pathophysiology and association with PD             -discussed importance of hydration.  Pt is to increase water intake   4.  Mild dementia             -Doing mental and physical exercises             -neurocog testing in 06/2023 with evidence of mild dementia             - Continue donepezil , 5 mg  daily.  Has helped with hallucination.   5.  Depression             -continue lexapro  10 mg daily   6.  Fatigue             -may be related to #5 but discussed that fatigue is the #1 treatment resistant complaint of Parkinson's patients.   7.  Probably Neurogenic Orthostatic Hypotension              -start midodrine  2.5 mg tid with meals             -raise HOB at night             - Increase water intake.   PAIN:  Are you having pain? No  FALLS: Has patient fallen in last 6 months?  See PT evaluation for details   PATIENT GOALS: Improve communication effectiveness  OBJECTIVE:    PATIENT REPORTED OUTCOME MEASURES (PROM): Communication Effectiveness Survey: to be administered during first  session  TODAY'S TREATMENT:                                                                                                                                         DATE:   08/12/24: Pt and wfe deny difficulties communicating at home since evaluation; they are talking in the same room and speaking to each other. Jeremy Davidson stated when he wants to talk with Jeremy Davidson I just get up and head that way. SLP reviewed memory strategies with pt and wife. Suggested pt/wife use [employer] home for reminders and alarms. Also provided some suggestions about other apps on iPhone for pt to use to aid memory Building Control Surveyor, Voice Memo). Jeremy Davidson is using nonverbal and verbal gestures - SLP told her/them  to track their effectiveness until next session.  Pt and wife suggested they return every other week. SLP agreed.  07/11/24: Initially Jeremy Davidson declined ST, however, agreed to x1/week x6 weeks ST after the evaluation when SLP included pt's wife in the session today, and collaborated with pt and wife about how to structure ST to benefit pt most. It was agreed that Jeremy Davidson would join sessions in order to learn things to perform at home so that pt can remain as intelligible as possible in that setting, and learn some good cues when she and pt  are out of the home to assist listener's comprehension of Jeremy Davidson's message. SLP will also work with Jeremy Davidson in session to improve muscle memory for louder (WNL) speech. SLP may also include dysphagia tasks such as clinical swallow eval, and/or MBS. Jeremy Davidson agreed to x1/week x6 weeks.    PATIENT EDUCATION: Education details: see today's treatment Person educated: Patient Education method: Explanation, Demonstration, and Verbal cues Education comprehension: verbalized understanding, returned demonstration, verbal cues required, and needs further education  HOME EXERCISE PROGRAM: Eventually, HEP for louder speech   GOALS: Goals reviewed with patient? No  SHORT TERM GOALS: =LONG TERM GOALS: Target date: 09/09/24 (due to first ST 08/12/24)  Pt will undergo MBS if clinically indicated Baseline:  Goal status: INITIAL  2.  Pt will produce 5 minutes conversation with average volume 69dB given occasional nonverbal cues over 2 sessions Baseline:  Goal status: INITIAL  3.  To improve Jeremy Davidson's intelligibility at home and in the community, Jeremy Davidson will demo successful verbal and/or nonverbal cues that improve pt's speech volume in or between 2 sessions (as reported by pt or Jeremy Davidson) Baseline:  Goal status: INITIAL  4.  Pt's PROM will show improvement from initial administration  Baseline:  Goal status: INITIAL   5. Pt will tell SLP 2 memory strategies with mod I    Baseline:   Goal status: INITIAL  ASSESSMENT:  CLINICAL IMPRESSION: Patient is a 73y.o. M who was seen today for treatment of speech skills in light of his Parkinson disease diagnosed approx 9 years ago. See treatment date above for today's date for further details on today's session. Memory stragegies were provided and discussed today. Pt and wife are making compensations for pt's speech volume and they are satisfied with this. SLP to consider d/c'ing speech goals.  On eval date, he denied having difficulty with speech volume but when SLP  recorded pt speaking he agreed he was speaking quietly with a weak voice. He states occasional difficulty with meals/liquids - SLP may perform clinical swallow eval and may recommend MBS if clinically indicated (LTG #1). and pt will use PRN (likely with cues from [place]).  OBJECTIVE IMPAIRMENTS: Objective impairments include attention, memory, dysarthria, and dysphagia. These impairments are limiting patient from managing medications, managing appointments, managing finances, household responsibilities, ADLs/IADLs, effectively communicating at home and in community, and safety when swallowing.Factors affecting potential to achieve goals and functional outcome are ability to learn/carryover information, cooperation/participation level, and previous level of function.. Patient will benefit from skilled SLP services to address above impairments and improve overall function.  REHAB POTENTIAL: Good  PLAN:  SLP FREQUENCY: 1x/week  SLP DURATION: 4 weeks  PLANNED INTERVENTIONS: Aspiration precaution training, Pharyngeal strengthening exercises, Diet toleration management , Environmental controls, Trials of upgraded texture/liquids, Cueing hierachy, Cognitive reorganization, Internal/external aids, Functional tasks, SLP instruction and feedback, Compensatory strategies, Patient/family education, and MBS (possible)    Itai Barbian, CCC-SLP 08/12/2024, 2:53 PM

## 2024-08-16 DIAGNOSIS — G20B2 Parkinson's disease with dyskinesia, with fluctuations: Secondary | ICD-10-CM | POA: Diagnosis not present

## 2024-08-16 DIAGNOSIS — E7800 Pure hypercholesterolemia, unspecified: Secondary | ICD-10-CM | POA: Diagnosis not present

## 2024-08-16 DIAGNOSIS — F321 Major depressive disorder, single episode, moderate: Secondary | ICD-10-CM | POA: Diagnosis not present

## 2024-08-19 ENCOUNTER — Ambulatory Visit

## 2024-08-20 DIAGNOSIS — C44519 Basal cell carcinoma of skin of other part of trunk: Secondary | ICD-10-CM | POA: Diagnosis not present

## 2024-08-20 DIAGNOSIS — L814 Other melanin hyperpigmentation: Secondary | ICD-10-CM | POA: Diagnosis not present

## 2024-08-20 DIAGNOSIS — L821 Other seborrheic keratosis: Secondary | ICD-10-CM | POA: Diagnosis not present

## 2024-08-20 DIAGNOSIS — D692 Other nonthrombocytopenic purpura: Secondary | ICD-10-CM | POA: Diagnosis not present

## 2024-08-20 DIAGNOSIS — L570 Actinic keratosis: Secondary | ICD-10-CM | POA: Diagnosis not present

## 2024-08-20 DIAGNOSIS — Z85828 Personal history of other malignant neoplasm of skin: Secondary | ICD-10-CM | POA: Diagnosis not present

## 2024-08-20 DIAGNOSIS — L853 Xerosis cutis: Secondary | ICD-10-CM | POA: Diagnosis not present

## 2024-08-20 DIAGNOSIS — D1801 Hemangioma of skin and subcutaneous tissue: Secondary | ICD-10-CM | POA: Diagnosis not present

## 2024-08-20 DIAGNOSIS — D0462 Carcinoma in situ of skin of left upper limb, including shoulder: Secondary | ICD-10-CM | POA: Diagnosis not present

## 2024-08-26 ENCOUNTER — Ambulatory Visit: Attending: Neurology

## 2024-08-26 DIAGNOSIS — R41841 Cognitive communication deficit: Secondary | ICD-10-CM | POA: Diagnosis not present

## 2024-08-26 DIAGNOSIS — R1312 Dysphagia, oropharyngeal phase: Secondary | ICD-10-CM | POA: Diagnosis not present

## 2024-08-26 DIAGNOSIS — R471 Dysarthria and anarthria: Secondary | ICD-10-CM | POA: Insufficient documentation

## 2024-08-26 NOTE — Therapy (Signed)
 OUTPATIENT SPEECH LANGUAGE PATHOLOGY [facility]   Patient Name: Jeremy Davidson MRN: [id number] DOB:06-06-1951, 73 y.o., male Today's Date: 08/26/2024  PCP: Regino Slater, MD REFERRING PROVIDER: Evonnie Stabs, DO   END OF SESSION:  End of Session - 08/26/24 1619     Visit Number 3    Number of Visits 5    Date for Recertification  09/12/24   due to start date of ST 08/09/24   SLP Start Time 1619    SLP Stop Time  1649    SLP Time Calculation (min) 30 min    Activity Tolerance Patient tolerated treatment well            Past Medical History:  Diagnosis Date   Acute metabolic encephalopathy 05/23/2022   viral URI   Blood in stool 2010   Colonoscopy benign repeat 2015   Difficulty walking 05/23/2022   Esophageal dysmotility    Essential hypertension 09/09/2015   Hardening of the aorta (main artery of the heart)    Hyperlipidemia 09/09/2015   Hyponatremia 05/23/2022   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   Esophageal dysmotility    Hardening of the aorta (main artery of the heart)    Acute metabolic encephalopathy 05/23/2022   Difficulty walking 05/23/2022   Hyponatremia 05/23/2022   Parkinson's disease (HCC) 03/17/2017   Essential hypertension 09/09/2015   Hyperlipidemia 09/09/2015    ONSET DATE: Approx 9 years ago, script dated 05/30/24  REFERRING DIAG:  G20.A1 (ICD-10-CM) - Parkinson's disease without dyskinesia or fluctuating manifestations (HCC)     THERAPY DIAG:  Dysphagia, oropharyngeal phase - Plan: SLP modified barium swallow  Dysarthria and anarthria - Plan: SLP modified barium swallow  Cognitive  communication deficit - Plan: SLP modified barium swallow  Rationale for Evaluation and Treatment: Rehabilitation  SUBJECTIVE:   SUBJECTIVE STATEMENT: He does have problems with his memory so if you can go over memory tips that would be good. Jeremy Davidson, and pt agreed)  Pt accompanied by: self  PERTINENT HISTORY: From Dr. Collie note 05/30/24: Jeremy Davidson was seen today in follow up for Parkinsons disease.  My previous records were reviewed prior to todays visit as well as outside records available to me.  Wife supplements history.  Patient was started on donepezil  last visit.  He is tolerating it well.  In regards to hallucinations, he reports that he is doing great.  He has some floaters but no hallucinations.  His BP has been low but he's not so good with the water and you will need to lecture us .    His wife emailed me back in February about [place] and asked about it, but they thought it was in clinical trial, when it was already on the market.  We expressed to them that it was already on the market, but at that time Medicare was not paying for it.  He missed his June appointment when I planned on discussing it further.  He has been to therapy screens and they have requested orders for physical and speech therapy.  1.  Parkinsons Disease             -continue  carbidopa /levodopa  25/100, 2 tablet at 6 AM/2 at 10am/2 at 2pm/1 at 6 PM.              -continue carbidopa /levodopa  50/200 at bed for first AM on             -Will send orders for physical and speech therapy.             -they asked about vyalev.  Discussed r/b/se.  Ultimately, wife states he has sensory issues and doesn't even like to wear a wedding ring.   2.  Insomnia             -no napping after 2 pm   3.  Constipation             -discussed nature and pathophysiology and association with PD             -discussed importance of hydration.  Pt is to increase water intake   4.  Mild dementia             -Doing mental and  physical exercises             -neurocog testing in 06/2023 with evidence of mild dementia             - Continue donepezil , 5 mg daily.  Has helped with hallucination.   5.  Depression             -continue lexapro  10 mg daily   6.  Fatigue             -may be related to #5 but discussed that fatigue is the #1 treatment resistant complaint of Parkinson's patients.   7.  Probably Neurogenic Orthostatic Hypotension              -start midodrine  2.5 mg tid with meals             -raise HOB at night             - Increase water intake.   PAIN:  Are you having pain? No  FALLS: Has patient fallen in last 6 months?  See PT evaluation for details   PATIENT GOALS: Improve communication effectiveness  OBJECTIVE:    PATIENT REPORTED OUTCOME MEASURES (PROM): Communication Effectiveness Survey: Jeremy Davidson filled out 08/26/24 with score 22/32, higher scores indicating that pt's communication is very effective. HE scored 4 (very effective) with participating in a conversation with strangers in a quiet place, and having a conversation while traveling in a car. He scored 1 (not at all effective) with taking part of a conversation in a noisy environment, and with someone at a distance.  TODAY'S TREATMENT:                                                                                                                                         DATE:   08/26/24: Jeremy Davidson still  endorses she needs hearing aids, re: hearing and understanding Jeremy Davidson. However both report it is going better due to compensatory measures. Pt and wife cannot think of any other areas necessary for SLP to assist with at this time as communication has improved due to compensations. CES was completed today with results as above. This was completed for a baseline for future therapy courses.  Jeremy Davidson would like to participate in a MBS as he continues to cough with liquids and solids, and regularly with saliva during the day. As plan of care was  signed off by referring MD and included MBS, SLP passed along referral for this today to Dr. Evonnie. Pt will schedule next visit after MBS.  Jeremy Davidson told SLP two strategies for memory that he could use, with mod I.   08/12/24: Pt and wfe deny difficulties communicating at home since evaluation; they are talking in the same room and speaking to each other. Jeremy Davidson stated when he wants to talk with Jeremy Davidson I just get up and head that way. SLP reviewed memory strategies with pt and wife. Suggested pt/wife use [employer] home for reminders and alarms. Also provided some suggestions about other apps on iPhone for pt to use to aid memory Building Control Surveyor, Voice Memo). Jeremy Davidson is using nonverbal and verbal gestures - SLP told her/them to track their effectiveness until next session.  Pt and wife suggested they return every other week. SLP agreed.  07/11/24: Initially Jeremy Davidson declined ST, however, agreed to x1/week x6 weeks ST after the evaluation when SLP included pt's wife in the session today, and collaborated with pt and wife about how to structure ST to benefit pt most. It was agreed that Jeremy Davidson would join sessions in order to learn things to perform at home so that pt can remain as intelligible as possible in that setting, and learn some good cues when she and pt are out of the home to assist listener's comprehension of Jeremy Davidson's message. SLP will also work with Jeremy Davidson in session to improve muscle memory for louder (WNL) speech. SLP may also include dysphagia tasks such as clinical swallow eval, and/or MBS. Jeremy Davidson agreed to x1/week x6 weeks.    PATIENT EDUCATION: Education details: see today's treatment Person educated: Patient Education method: Explanation, Demonstration, and Verbal cues Education comprehension: verbalized understanding, returned demonstration, verbal cues required, and needs further education  HOME EXERCISE PROGRAM: Eventually, HEP for louder speech   GOALS: Goals reviewed with patient? No  SHORT TERM GOALS:  =LONG TERM GOALS: Target date: 09/09/24 (due to first ST 08/12/24)  Pt will undergo MBS if clinically indicated Baseline:  Goal status: met  2.  Pt will produce 5 minutes conversation with average volume 69dB given occasional nonverbal cues over 2 sessions Baseline:  Goal status: d/c per pt/wife satisfied with pt's speech  3.  To improve Jeremy Davidson's intelligibility at home and in the community, Jeremy Davidson will demo successful verbal and/or nonverbal cues that improve pt's speech volume in or between 2 sessions (as reported by pt or Jeremy Davidson) Baseline:  Goal status: d/c, as pt/wife satisfied with pt's speech  4.  Pt's PROM will show improvement from initial administration  Baseline:  Goal status: INITIAL   5. Pt will tell SLP 2 memory strategies with mod I    Baseline:   Goal status: met  ASSESSMENT:  CLINICAL IMPRESSION: Patient is a 73y.o. M who was seen today for treatment of speech skills in light of his Parkinson disease diagnosed approx 9 years ago. See treatment date above for  today's date for further details on today's session. Pt completed CES today. Pt and wife cont to make compensations for pt's speech volume and continue satisfied with this. SLP to d/c speech goals.  On eval date, he denied having difficulty with speech volume but when SLP recorded pt speaking he agreed he was speaking quietly with a weak voice. He states occasional difficulty with meals/liquids. MBS suggested today and order sent for referring MD to cosign.   OBJECTIVE IMPAIRMENTS: Objective impairments include attention, memory, dysarthria, and dysphagia. These impairments are limiting patient from managing medications, managing appointments, managing finances, household responsibilities, ADLs/IADLs, effectively communicating at home and in community, and safety when swallowing.Factors affecting potential to achieve goals and functional outcome are ability to learn/carryover information, cooperation/participation level,  and previous level of function.. Patient will benefit from skilled SLP services to address above impairments and improve overall function.  REHAB POTENTIAL: Good  PLAN:  SLP FREQUENCY: 1x/week  SLP DURATION: 4 weeks  PLANNED INTERVENTIONS: Aspiration precaution training, Pharyngeal strengthening exercises, Diet toleration management , Environmental controls, Trials of upgraded texture/liquids, Cueing hierachy, Cognitive reorganization, Internal/external aids, Functional tasks, SLP instruction and feedback, Compensatory strategies, Patient/family education, and MBS (possible)    Jeremy Davidson, CCC-SLP 08/26/2024, 5:23 PM

## 2024-08-27 ENCOUNTER — Other Ambulatory Visit (HOSPITAL_COMMUNITY): Payer: Self-pay | Admitting: *Deleted

## 2024-08-27 DIAGNOSIS — R1310 Dysphagia, unspecified: Secondary | ICD-10-CM

## 2024-09-02 ENCOUNTER — Encounter

## 2024-09-04 DIAGNOSIS — E7800 Pure hypercholesterolemia, unspecified: Secondary | ICD-10-CM | POA: Diagnosis not present

## 2024-09-04 DIAGNOSIS — E871 Hypo-osmolality and hyponatremia: Secondary | ICD-10-CM | POA: Diagnosis not present

## 2024-09-04 DIAGNOSIS — Z79899 Other long term (current) drug therapy: Secondary | ICD-10-CM | POA: Diagnosis not present

## 2024-09-07 ENCOUNTER — Other Ambulatory Visit: Payer: Self-pay | Admitting: Neurology

## 2024-09-07 DIAGNOSIS — G20A1 Parkinson's disease without dyskinesia, without mention of fluctuations: Secondary | ICD-10-CM

## 2024-09-09 ENCOUNTER — Encounter

## 2024-09-13 ENCOUNTER — Other Ambulatory Visit: Payer: Self-pay | Admitting: Neurology

## 2024-09-13 DIAGNOSIS — R4189 Other symptoms and signs involving cognitive functions and awareness: Secondary | ICD-10-CM

## 2024-09-13 DIAGNOSIS — G20A1 Parkinson's disease without dyskinesia, without mention of fluctuations: Secondary | ICD-10-CM

## 2024-09-14 ENCOUNTER — Other Ambulatory Visit: Payer: Self-pay | Admitting: Neurology

## 2024-09-14 DIAGNOSIS — G20A1 Parkinson's disease without dyskinesia, without mention of fluctuations: Secondary | ICD-10-CM

## 2024-09-15 DIAGNOSIS — F321 Major depressive disorder, single episode, moderate: Secondary | ICD-10-CM | POA: Diagnosis not present

## 2024-09-15 DIAGNOSIS — G20B2 Parkinson's disease with dyskinesia, with fluctuations: Secondary | ICD-10-CM | POA: Diagnosis not present

## 2024-09-15 DIAGNOSIS — E7800 Pure hypercholesterolemia, unspecified: Secondary | ICD-10-CM | POA: Diagnosis not present

## 2024-09-16 ENCOUNTER — Encounter

## 2024-09-16 DIAGNOSIS — Z961 Presence of intraocular lens: Secondary | ICD-10-CM | POA: Diagnosis not present

## 2024-09-16 DIAGNOSIS — H52203 Unspecified astigmatism, bilateral: Secondary | ICD-10-CM | POA: Diagnosis not present

## 2024-09-16 DIAGNOSIS — H04123 Dry eye syndrome of bilateral lacrimal glands: Secondary | ICD-10-CM | POA: Diagnosis not present

## 2024-09-17 ENCOUNTER — Ambulatory Visit (HOSPITAL_COMMUNITY)
Admission: RE | Admit: 2024-09-17 | Discharge: 2024-09-17 | Disposition: A | Discharge status: Home / Self Care | Source: Ambulatory Visit | Attending: Family Medicine | Admitting: Family Medicine

## 2024-09-17 DIAGNOSIS — R1310 Dysphagia, unspecified: Secondary | ICD-10-CM | POA: Insufficient documentation

## 2024-09-17 DIAGNOSIS — F02A0 Dementia in other diseases classified elsewhere, mild, without behavioral disturbance, psychotic disturbance, mood disturbance, and anxiety: Secondary | ICD-10-CM | POA: Diagnosis not present

## 2024-09-17 DIAGNOSIS — R41841 Cognitive communication deficit: Secondary | ICD-10-CM

## 2024-09-17 DIAGNOSIS — R471 Dysarthria and anarthria: Secondary | ICD-10-CM

## 2024-09-17 DIAGNOSIS — R059 Cough, unspecified: Secondary | ICD-10-CM | POA: Insufficient documentation

## 2024-09-17 DIAGNOSIS — K219 Gastro-esophageal reflux disease without esophagitis: Secondary | ICD-10-CM | POA: Diagnosis not present

## 2024-09-17 DIAGNOSIS — G20A1 Parkinson's disease without dyskinesia, without mention of fluctuations: Secondary | ICD-10-CM | POA: Diagnosis not present

## 2024-09-17 DIAGNOSIS — R490 Dysphonia: Secondary | ICD-10-CM | POA: Diagnosis not present

## 2024-09-17 DIAGNOSIS — R1312 Dysphagia, oropharyngeal phase: Secondary | ICD-10-CM

## 2024-09-17 NOTE — Progress Notes (Signed)
 Modified Barium Swallow Study  Patient Details  Name: Jeremy Davidson MRN: [id number] Date of Birth: [birth year]  Today's Date: 09/17/2024  HPI/PMH: HPI: pt is a 73 yo male referred for repeat OP MBS due to ongoing issues with dysphagia.  Pt with PMH + for Parkinson's disease, esophageal dysmotility, URI, mild dementia in setting of Parkinson's and ongoing issues with coughing associated with and without PO intake.  Pt has previously undergone MBS 07/2023 that showed functional oropharyngeal swallow, penetration of liquids - trace and trace retention.  He has maintained his weight, not had pulmonary infections and has not required the heimlich manuever per spouse.  He denies pill dysphagia and take some pills without liquids at times.  Admits to sensation of mucous in his throat requiring him to cough to clear at times- which he suspects contributes to his dysphagia.  Voice is hypophonic and hoarse but clears with high pitched e sound.   Clinical Impression: Clinical Impression: Patient's swallow function is nearly consistent with prior exam approx one year ago. He continues with decreased sustained laryngeal closure allowing penetration of thin liquids to upper larynx inconsistently. He takes large boluses most notably with thin liquids - which likely worsens his penetration.  Trace SILENT aspiration of thin *just below vocal cords* noted when taking barium tablet with thin with second swallow.  Pt reflexively is conducting breathhold and 2 swallows with liquid boluses. In addition to decreased sustained laryngeal closure, pt appears with decreased oral coordination with premature spillage of liquid to pyriform sinus on occasion - suspect due to his Parkinson's impacting initiating.  Did not test chin tuck posture as pt with oral transiting difficulties - and was not necessary at this time.  Pharyngeal swallow is strong with only trace retention.   He did not cough or complain of dysphagia during  testing.  Recommend brief follow up with OP SLP to establish HEP of laryngeal closure exercises an Respiratory Muscle Strength Training due to his Parkinson's disease.    Reviewed MBS flouroscopy loops with pt and his wife using teach back. As pt reports more issues with foods - recommend anti choking apparatus i.e. Life Vac for emergent use. Pt has not had pneumonias nor weight loss, therefore he is tolerating his PO diet.  Recommend continue diet with precautions - Thanks for this consult.  Factors that may increase risk of adverse event in presence of aspiration [name] & [name] 2021): Factors that may increase risk of adverse event in presence of aspiration [name] & [name] 2021): Reduced cognitive function   Recommendations/Plan: Swallowing Evaluation Recommendations Swallowing Evaluation Recommendations Recommendations: PO diet PO Diet Recommendation: Regular; Thin liquids (Level 0) Liquid Administration via: Cup; Straw Medication Administration: Other (Comment) (as tolerated) Supervision: Full supervision/cueing for swallowing strategies Swallowing strategies  : Slow rate; Small bites/sips (start intake with liquids) Postural changes: Position pt fully upright for meals; Stay upright 30-60 min after meals Oral care recommendations: Oral care BID (2x/day)    Treatment Plan Treatment Plan Follow-up recommendations: Outpatient SLP     Recommendations Recommendations for follow up therapy are one component of a multi-disciplinary discharge planning process, led by the attending physician.  Recommendations may be updated based on patient status, additional functional criteria and insurance authorization.  Assessment: Orofacial Exam: Orofacial Exam Oral Cavity: Oral Hygiene: WFL    Anatomy:  Anatomy: WFL   Boluses Administered: Boluses Administered Boluses Administered: Thin liquids (Level 0); Mildly thick liquids (Level 2, nectar thick); Moderately thick liquids (Level  3, honey thick);  Puree; Solid     Oral Impairment Domain: Oral Impairment Domain Lip Closure: No labial escape Tongue control during bolus hold: Cohesive bolus between tongue to palatal seal; Posterior escape of less than half of bolus Bolus preparation/mastication: Timely and efficient chewing and mashing Bolus transport/lingual motion: Brisk tongue motion Oral residue: Residue collection on oral structures; Trace residue lining oral structures Location of oral residue : Tongue Initiation of pharyngeal swallow : Pyriform sinuses; Posterior laryngeal surface of the epiglottis; Valleculae     Pharyngeal Impairment Domain: Pharyngeal Impairment Domain Soft palate elevation: No bolus between soft palate (SP)/pharyngeal wall (PW) Laryngeal elevation: Partial superior movement of thyroid  cartilage/partial approximation of arytenoids to epiglottic petiole Anterior hyoid excursion: Complete anterior movement Epiglottic movement: Complete inversion Laryngeal vestibule closure: Incomplete, narrow column air/contrast in laryngeal vestibule Pharyngeal stripping wave : Present - complete Pharyngeal contraction (A/P view only): N/A Pharyngoesophageal segment opening: Complete distension and complete duration, no obstruction of flow Tongue base retraction: Trace column of contrast or air between tongue base and PPW Pharyngeal residue: Trace residue within or on pharyngeal structures     Esophageal Impairment Domain: Esophageal Impairment Domain Esophageal clearance upright position: Complete clearance, esophageal coating    Pill: No data recorded  Penetration/Aspiration Scale Score: Penetration/Aspiration Scale Score 1.  Material does not enter airway: Mildly thick liquids (Level 2, nectar thick); Moderately thick liquids (Level 3, honey thick); Puree; Solid 2.  Material enters airway, remains ABOVE vocal cords then ejected out: Thin liquids (Level 0) 8.  Material enters airway, passes  BELOW cords without attempt by patient to eject out (silent aspiration) : Thin liquids (Level 0) (when consumed with barium tablet)    Compensatory Strategies: Compensatory Strategies Compensatory strategies: No       General Information: Caregiver present: Yes   Diet Prior to this Study: Regular; Thin liquids (Level 0)    Temperature : Normal    Respiratory Status: WFL    Supplemental O2: None (Room air)    History of Recent Intubation: No   Behavior/Cognition: Alert; Cooperative; Pleasant mood  Self-Feeding Abilities: Able to self-feed  Baseline vocal quality/speech: Dysphonic  Volitional Cough: Able to elicit  Volitional Swallow: Able to elicit  Exam Limitations: No limitations   Goal Planning: No data recorded No data recorded No data recorded No data recorded No data recorded  Pain: Pain Assessment Pain Assessment: No/denies pain    End of Session: Start Time:SLP Start Time (ACUTE ONLY): 1253  Stop Time: SLP Stop Time (ACUTE ONLY): 1325  Time Calculation:SLP Time Calculation (min) (ACUTE ONLY): 32 min  Charges: SLP Evaluations $ SLP Speech Visit: 1 Visit  SLP Evaluations $Outpatient MBS Swallow: 1 Procedure $Swallowing Treatment: 1 Procedure   SLP visit diagnosis: SLP Visit Diagnosis: Dysphagia, oropharyngeal phase (R13.12)    Past Medical History:  Past Medical History:  Diagnosis Date   Acute metabolic encephalopathy 05/23/2022   viral URI   Blood in stool 2010   Colonoscopy benign repeat 2015   Difficulty walking 05/23/2022   Esophageal dysmotility    Essential hypertension 09/09/2015   Hardening of the aorta (main artery of the heart)    Hyperlipidemia 09/09/2015   Hyponatremia 05/23/2022   Mild dementia due to Parkinson's disease 07/07/2023   Parkinson's disease (HCC) 03/17/2017   Viral URI with cough 05/23/2022   Past Surgical History:  Past Surgical History:  Procedure Laterality Date   HERNIA REPAIR  06/23/2010    With large ultra pro hrnia system-- Dr Ebbie 1971. x2  Madelin POUR, MS [facility] SLP Acute Rehab Services Office [phone]   Nicolas Emmie Caldron 09/17/2024, 1:50 PM

## 2024-09-19 ENCOUNTER — Ambulatory Visit

## 2024-09-25 ENCOUNTER — Ambulatory Visit: Attending: Neurology

## 2024-09-25 DIAGNOSIS — R1312 Dysphagia, oropharyngeal phase: Secondary | ICD-10-CM

## 2024-09-25 DIAGNOSIS — R41841 Cognitive communication deficit: Secondary | ICD-10-CM | POA: Insufficient documentation

## 2024-09-25 DIAGNOSIS — R471 Dysarthria and anarthria: Secondary | ICD-10-CM | POA: Insufficient documentation

## 2024-09-25 NOTE — Patient Instructions (Signed)
° °  SWALLOWING EXERCISES Do these 5-6 days/week for 8 weeks, then 2 days per week afterwards You can use 1-2 drops of liquid to help you swallow, if your mouth gets dry       1.  Super Swallow  - Take a breath- hold it   (take 1-2 drops)  -  Bear down  - Swallow and cough  - Do at least 30 reps/day, in sets of 10-15

## 2024-09-25 NOTE — Therapy (Signed)
 OUTPATIENT SPEECH LANGUAGE PATHOLOGY PARKINSON'S TREATMENT-RECERTIFICATION   Patient Name: Jeremy Davidson Davidson MRN: [id number] DOB:Jul 28, 1951, 73 y.o., male Today's Date: 09/26/2024  PCP: Jeremy Davidson Slater, MD REFERRING PROVIDER: Evonnie Stabs, DO   END OF SESSION:  End of Session - 09/26/24 0842     Visit Number 4    Number of Visits 8    Date for Recertification  11/22/24   due to start date of ST 08/09/24   SLP Start Time 1320    SLP Stop Time  1400    SLP Time Calculation (min) 40 min    Activity Tolerance Patient tolerated treatment well             Past Medical History:  Diagnosis Date   Acute metabolic encephalopathy 05/23/2022   viral URI   Blood in stool 2010   Colonoscopy benign repeat 2015   Difficulty walking 05/23/2022   Esophageal dysmotility    Essential hypertension 09/09/2015   Hardening of the aorta (main artery of the heart)    Hyperlipidemia 09/09/2015   Hyponatremia 05/23/2022   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   Esophageal dysmotility    Hardening of the aorta (main artery of the heart)    Acute metabolic encephalopathy 05/23/2022   Difficulty walking 05/23/2022   Hyponatremia 05/23/2022   Parkinson's disease (HCC) 03/17/2017   Essential hypertension 09/09/2015   Hyperlipidemia 09/09/2015    Speech Therapy Progress Note  Dates of Reporting Period: 07/11/24 to present  Subjective Statement: Pt was seen for ST for dysarthria but also had questions about his swallowing and a MBS was completed with recommended f/u for OPST.  Objective: Pt's speech intelligibility has improved primarily due to  compensations he and his wife have put into place.  See MBS report under 'Imaging. One exercise for dysphagia was presented today and pt will be monitored with this exercise. He was told to complete at least x5 days/week,for 8 weeks, and then twice a week after that time. SLP encouraged pt's wife to assist PRN with procedure.  Goal Update: See below.  Plan: See pt for 4 more sessions, every other week.  Reason Skilled Services are Required: PT requires skilled ST to cont due to SLP need to monitor pt accuracy with this exercise to ensure pt success.    ONSET DATE: Approx 9 years ago, script dated 05/30/24  REFERRING DIAG:  G20.A1 (ICD-10-CM) - Parkinson's disease without dyskinesia or fluctuating manifestations (HCC)     THERAPY DIAG:  Dysphagia, oropharyngeal phase  Dysarthria and anarthria  Cognitive communication deficit  Rationale for Evaluation and Treatment: Rehabilitation  SUBJECTIVE:   SUBJECTIVE STATEMENT: I think that's a good idea ([name], re: return for ensuring exercise is done correctly)  Pt accompanied by: self  PERTINENT HISTORY:  MBS 09/17/24 Clinical Impression: Patient's swallow function is nearly consistent with prior exam approx one year ago. He continues with decreased sustained laryngeal closure allowing penetration of thin liquids to upper larynx inconsistently. He takes large boluses most notably with thin liquids - which likely worsens his penetration.  Trace SILENT aspiration of thin *just below vocal cords* noted when taking barium tablet with thin with  second swallow.     Pt reflexively is conducting breathhold and 2 swallows with liquid boluses. In addition to decreased sustained laryngeal closure, pt appears with decreased oral coordination with premature spillage of liquid to pyriform sinus on occasion - suspect due to his Parkinson's impacting initiating.     Did not test chin tuck posture as pt with oral transiting difficulties - and was not  necessary at this time.  Pharyngeal swallow is strong with only trace retention.   He did not cough or complain of dysphagia during testing.     Recommend brief follow up with OP SLP to establish HEP of laryngeal closure exercises an Respiratory Muscle Strength Training due to his Parkinson's disease.    Reviewed MBS flouroscopy loops with pt and his wife using teach back. As pt reports more issues with foods - recommend anti choking apparatus i.e. Life Vac for emergent use.    Pt has not had pneumonias nor weight loss, therefore he is tolerating his PO diet.  Recommend continue diet with precautions - Thanks for this consult.   Factors that may increase risk of adverse event in presence of aspiration [name] & Jeremy Davidson 2021): Factors that may increase risk of adverse event in presence of aspiration [name] & Jeremy Davidson 2021): Reduced cognitive function     Recommendations/Plan: Swallowing Evaluation Recommendations Swallowing Evaluation Recommendations Recommendations: PO diet PO Diet Recommendation: Regular; Thin liquids (Level 0) Liquid Administration via: Cup; Straw Medication Administration: Other (Comment) (as tolerated) Supervision: Full supervision/cueing for swallowing strategies Swallowing strategies  : Slow rate; Small bites/sips (start intake with liquids) Postural changes: Position pt fully upright for meals; Stay upright 30-60 min after meals Oral care recommendations: Oral care BID (2x/day)  From Dr. Collie note 05/30/24: Jeremy Davidson Davidson was seen today in follow up for Parkinsons disease.  My previous records were reviewed prior to todays visit as well as outside records available to me.  Wife supplements history.  Patient was started on donepezil  last visit.  He is tolerating it well.  In regards to hallucinations, he reports that he is doing great.  He has some floaters but no hallucinations.  His BP has been low but he's not so good with the water and you will need to lecture  us .    His wife emailed me back in February about Jeremy Davidson and asked about it, but they thought it was in clinical trial, when it was already on the market.  We expressed to them that it was already on the market, but at that time Medicare was not paying for it.  He missed his June appointment when I planned on discussing it further.  He has been to therapy screens and they have requested orders for physical and speech therapy.  1.  Parkinsons Disease             -continue carbidopa /levodopa  25/100, 2 tablet at 6 AM/2 at 10am/2 at 2pm/1 at 6 PM.              -continue carbidopa /levodopa  50/200 at bed for first AM on             -Will send orders for physical and speech therapy.             -they asked about vyalev.  Discussed r/b/se.  Ultimately, wife states he has sensory issues and doesn't even like to wear a wedding ring.   2.  Insomnia             -no  napping after 2 pm   3.  Constipation             -discussed nature and pathophysiology and association with PD             -discussed importance of hydration.  Pt is to increase water intake   4.  Mild dementia             -Doing mental and physical exercises             -neurocog testing in 06/2023 with evidence of mild dementia             - Continue donepezil , 5 mg daily.  Has helped with hallucination.   5.  Depression             -continue lexapro  10 mg daily   6.  Fatigue             -may be related to #5 but discussed that fatigue is the #1 treatment resistant complaint of Parkinson's patients.   7.  Probably Neurogenic Orthostatic Hypotension              -start midodrine  2.5 mg tid with meals             -raise HOB at night             - Increase water intake.   PAIN:  Are you having pain? No  FALLS: Has patient fallen in last 6 months?  See PT evaluation for details   PATIENT GOALS: Improve communication effectiveness  OBJECTIVE:    PATIENT REPORTED OUTCOME MEASURES (PROM): Communication Effectiveness Survey:  [name] filled out 08/26/24 with score 22/32, higher scores indicating that pt's communication is very effective. HE scored 4 (very effective) with participating in a conversation with strangers in a quiet place, and having a conversation while traveling in a car. He scored 1 (not at all effective) with taking part of a conversation in a noisy environment, and with someone at a distance.  TODAY'S TREATMENT:                                                                                                                                         DATE:   09/25/24: Pt and Arland indicated that speech situations cont to go well, due to implementing compensations for improved communication. PROMs completed today - CES-19/32 indicating that pt's awareness of his speech difficulties may have improved since November. Scores for conversation in a car and with strangers in a quiet place both were less than in November. EAT-10 was completed with [name] scoring himself 4/32 indicating low effect of eating on his QoL. SLP reviewed MBS with pt and Arland and SLP educated pt and Arland about one exercise for laryngeal closure (supersupraglottic), which Arland stated she could assist pt PRN. [name] req'd usual mod A including SLP model faded to min cues rarely  for sequencing. SLP to follow every other week for 3-4 visits to ensure pt success with completion of this exercise.  08/26/24: Arland still endorses she needs hearing aids, re: hearing and understanding [name]. However both report it is going better due to compensatory measures. Pt and wife cannot think of any other areas necessary for SLP to assist with at this time as communication has improved due to compensations. CES was completed today with results as above. This was completed for a baseline for future therapy courses.  [name] would like to participate in a MBS as he continues to cough with liquids and solids, and regularly with saliva during the day. As plan of care was signed  off by referring MD and included MBS, SLP passed along referral for this today to Dr. Evonnie. Pt will schedule next visit after MBS.  [name] told SLP two strategies for memory that he could use, with mod I.   08/12/24: Pt and wfe deny difficulties communicating at home since evaluation; they are talking in the same room and speaking to each other. [name] stated when he wants to talk with Arland I just get up and head that way. SLP reviewed memory strategies with pt and wife. Suggested pt/wife use [employer] home for reminders and alarms. Also provided some suggestions about other apps on iPhone for pt to use to aid memory Building [employer], [employer]). Arland is using nonverbal and verbal gestures - SLP told her/them to track their effectiveness until next session.  Pt and wife suggested they return every other week. SLP agreed.  07/11/24: Initially [name] declined ST, however, agreed to x1/week x6 weeks ST after the evaluation when SLP included pt's wife in the session today, and collaborated with pt and wife about how to structure ST to benefit pt most. It was agreed that Arland would join sessions in order to learn things to perform at home so that pt can remain as intelligible as possible in that setting, and learn some good cues when she and pt are out of the home to assist listener's comprehension of [name]'s message. SLP will also work with [name] in session to improve muscle memory for louder (WNL) speech. SLP may also include dysphagia tasks such as clinical swallow eval, and/or MBS. [name] agreed to x1/week x6 weeks.    PATIENT EDUCATION: Education details: see today's treatment Person educated: Patient Education method: Explanation, Demonstration, and Verbal cues Education comprehension: verbalized understanding, returned demonstration, verbal cues required, and needs further education  HOME EXERCISE PROGRAM: Eventually, HEP for louder speech   GOALS: Goals reviewed with patient? No  SHORT TERM GOALS: =LONG TERM  GOALS: Target date:   11/22/24  Pt will undergo MBS if clinically indicated Baseline:  Goal status: met  2.  Pt will produce 5 minutes conversation with average volume 69dB given occasional nonverbal cues over 2 sessions Baseline:  Goal status: d/c per pt/wife satisfied with pt's speech  3.  To improve [name]'s intelligibility at home and in the community, Arland will demo successful verbal and/or nonverbal cues that improve pt's speech volume in or between 2 sessions (as reported by pt or Arland) Baseline:  Goal status: d/c, as pt/wife satisfied with pt's speech  4.  Pt's PROM will show improvement from initial administration  Baseline:  Goal status: INITIAL   5. Pt will tell SLP 2 memory strategies with mod I    Baseline:   Goal status: met    6.  Pt will complete supersupraglottic with mod I in two  sessions  Baseline:  Goal status: INITIAL  ASSESSMENT:  CLINICAL IMPRESSION: RECERT TODAY. Patient is a 73y.o. M who was seen today for treatment of swallowing skills in light of his Parkinson disease diagnosed approx 9 years ago. See treatment date above for today's date for further details on today's session. MBS was completed 09/17/24 with results above under diagnostic information. Pt and wife cont to make compensations for pt's speech volume and continue satisfied with this. SLP to d/c speech goals.  On eval date, he denied having difficulty with speech volume but when SLP recorded pt speaking he agreed he was speaking quietly with a weak voice. He states occasional difficulty with meals/liquids.   OBJECTIVE IMPAIRMENTS: Objective impairments include attention, memory, dysarthria, and dysphagia. These impairments are limiting patient from managing medications, managing appointments, managing finances, household responsibilities, ADLs/IADLs, effectively communicating at home and in community, and safety when swallowing.Factors affecting potential to achieve goals and functional outcome  are ability to learn/carryover information, cooperation/participation level, and previous level of function.. Patient will benefit from skilled SLP services to address above impairments and improve overall function.  REHAB POTENTIAL: Good  PLAN:  SLP FREQUENCY: every other week  SLP DURATION: 4 sessions  PLANNED INTERVENTIONS: Aspiration precaution training, Pharyngeal strengthening exercises, Diet toleration management , Environmental controls, Trials of upgraded texture/liquids, Cueing hierachy, Cognitive reorganization, Internal/external aids, Functional tasks, SLP instruction and feedback, Compensatory strategies, Patient/family education, and [id number] Treatment of swallowing function    Alecea Trego, CCC-SLP 09/26/2024, 8:44 AM

## 2024-10-08 ENCOUNTER — Ambulatory Visit

## 2024-10-14 ENCOUNTER — Other Ambulatory Visit: Payer: Self-pay | Admitting: Neurology

## 2024-10-15 ENCOUNTER — Ambulatory Visit

## 2024-10-15 DIAGNOSIS — R471 Dysarthria and anarthria: Secondary | ICD-10-CM

## 2024-10-15 DIAGNOSIS — R41841 Cognitive communication deficit: Secondary | ICD-10-CM

## 2024-10-15 DIAGNOSIS — R1312 Dysphagia, oropharyngeal phase: Secondary | ICD-10-CM

## 2024-10-15 NOTE — Patient Instructions (Addendum)
" ° ° °  -  TAKE A BREATH AND SQUEEZE                      then   - SWALLOW SALIVA AND COUGH "

## 2024-10-15 NOTE — Therapy (Signed)
 " OUTPATIENT SPEECH LANGUAGE PATHOLOGY PARKINSON'S TREATMENT   Patient Name: Jeremy Davidson MRN: [id number] DOB:1951/02/25, 73 y.o., male Today's Date: 10/15/2024  PCP: Regino Slater, MD REFERRING PROVIDER: Evonnie Stabs, DO   END OF SESSION:  End of Session - 10/15/24 1736     Visit Number 5    Number of Visits 8    Date for Recertification  11/22/24    SLP Start Time 1618    SLP Stop Time  1634    SLP Time Calculation (min) 16 min    Activity Tolerance Patient tolerated treatment well             Past Medical History:  Diagnosis Date   Acute metabolic encephalopathy 05/23/2022   viral URI   Blood in stool 2010   Colonoscopy benign repeat 2015   Difficulty walking 05/23/2022   Esophageal dysmotility    Essential hypertension 09/09/2015   Hardening of the aorta (main artery of the heart)    Hyperlipidemia 09/09/2015   Hyponatremia 05/23/2022   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   Esophageal dysmotility    Hardening of the aorta (main artery of the heart)    Acute metabolic encephalopathy 05/23/2022   Difficulty walking 05/23/2022   Hyponatremia 05/23/2022   Parkinson's disease (HCC) 03/17/2017   Essential hypertension 09/09/2015   Hyperlipidemia 09/09/2015     ONSET DATE: Approx 9 years ago, script dated 05/30/24  REFERRING DIAG:  G20.A1 (ICD-10-CM) - Parkinson's disease without dyskinesia or fluctuating manifestations (HCC)     THERAPY DIAG:  Dysphagia, oropharyngeal phase  Dysarthria and anarthria  Cognitive communication deficit  Rationale for Evaluation and Treatment: Rehabilitation  SUBJECTIVE:    SUBJECTIVE STATEMENT: I think (communication) is better, even [than previous session]. Pt accompanied by: self  PERTINENT HISTORY:  MBS 09/17/24 Clinical Impression: Patient's swallow function is nearly consistent with prior exam approx one year ago. He continues with decreased sustained laryngeal closure allowing penetration of thin liquids to upper larynx inconsistently. He takes large boluses most notably with thin liquids - which likely worsens his penetration.  Trace SILENT aspiration of thin *just below vocal cords* noted when taking barium tablet with thin with second swallow.     Pt reflexively is conducting breathhold and 2 swallows with liquid boluses. In addition to decreased sustained laryngeal closure, pt appears with decreased oral coordination with premature spillage of liquid to pyriform sinus on occasion - suspect due to his Parkinson's impacting initiating.     Did not test chin tuck posture as pt with oral transiting difficulties - and was not necessary at this time.  Pharyngeal swallow is strong with only trace retention.   He did not cough or complain of dysphagia during testing.     Recommend brief follow up with OP SLP to establish HEP of laryngeal closure exercises an Respiratory Muscle Strength Training due to his Parkinson's disease.    Reviewed MBS flouroscopy loops with pt and his wife using teach back. As pt reports more issues with foods - recommend anti choking apparatus i.e. Life Vac for emergent use.    Pt has not had pneumonias nor weight loss, therefore  he is tolerating his PO diet.  Recommend continue diet with precautions - Thanks for this consult.   Factors that may increase risk of adverse event in presence of aspiration [name] & [name] 2021): Factors that may increase risk of adverse event in presence of aspiration [name] & [name] 2021): Reduced cognitive function     Recommendations/Plan: Swallowing Evaluation Recommendations Swallowing  Evaluation Recommendations Recommendations: PO diet PO Diet Recommendation: Regular; Thin liquids (Level 0) Liquid Administration via: Cup; Straw Medication Administration: Other (Comment) (as tolerated) Supervision: Full supervision/cueing for swallowing strategies Swallowing strategies  : Slow rate; Small bites/sips (start intake with liquids) Postural changes: Position pt fully upright for meals; Stay upright 30-60 min after meals Oral care recommendations: Oral care BID (2x/day)  From Dr. Collie note 05/30/24: [name] was seen today in follow up for Parkinsons disease.  My previous records were reviewed prior to todays visit as well as outside records available to me.  Wife supplements history.  Patient was started on donepezil  last visit.  He is tolerating it well.  In regards to hallucinations, he reports that he is doing great.  He has some floaters but no hallucinations.  His BP has been low but he's not so good with the water and you will need to lecture us .    His wife emailed me back in February about [place] and asked about it, but they thought it was in clinical trial, when it was already on the market.  We expressed to them that it was already on the market, but at that time Medicare was not paying for it.  He missed his June appointment when I planned on discussing it further.  He has been to therapy screens and they have requested orders for physical and speech therapy.  1.  Parkinsons Disease             -continue carbidopa /levodopa  25/100, 2 tablet at 6 AM/2 at 10am/2 at 2pm/1 at 6 PM.              -continue carbidopa /levodopa  50/200 at bed for first AM on             -Will send orders for physical and speech therapy.             -they asked about vyalev.  Discussed r/b/se.  Ultimately, wife states he has sensory issues and doesn't even like to wear a wedding ring.   2.  Insomnia             -no napping after 2 pm   3.  Constipation             -discussed nature and  pathophysiology and association with PD             -discussed importance of hydration.  Pt is to increase water intake   4.  Mild dementia             -Doing mental and physical exercises             -neurocog testing in 06/2023 with evidence of mild dementia             - Continue donepezil , 5 mg daily.  Has helped with hallucination.   5.  Depression             -continue lexapro  10 mg daily   6.  Fatigue             -may be related to #5 but discussed that  fatigue is the #1 treatment resistant complaint of Parkinson's patients.   7.  Probably Neurogenic Orthostatic Hypotension              -start midodrine  2.5 mg tid with meals             -raise HOB at night             - Increase water intake.   PAIN:  Are you having pain? No  FALLS: Has patient fallen in last 6 months?  See PT evaluation for details   PATIENT GOALS: Improve communication effectiveness  OBJECTIVE:    PATIENT REPORTED OUTCOME MEASURES (PROM): Communication Effectiveness Survey: Pat filled out 08/26/24 with score 22/32, higher scores indicating that pt's communication is very effective. HE scored 4 (very effective) with participating in a conversation with strangers in a quiet place, and having a conversation while traveling in a car. He scored 1 (not at all effective) with taking part of a conversation in a noisy environment, and with someone at a distance.  TODAY'S TREATMENT:                                                                                                                                         DATE:   10/15/24: [name] tells SLP that communication between he and Arland has improved since the last session. Pt indicates he has been better with consistency of HEP in the last approx two weeks; Did not perform HEP in the first 7-10 days after previous session. Today pt understands it is SLP's goal to ensure pt is performing supersupraglottic swallow appropriately. Today pt req'd occasional mod A  quickly faded to rare min A for ensuring  he is swallowing in order to complete the exercise. SLP met pt's wife in lobby and she stated she was very comfortable with assisting pt PRN, and pt agreed to decr frequency to once every 4 weeks. Possible d/c next session depending on Donna's ability to cue pt correctly.   09/25/24: Pt and Arland indicated that speech situations cont to go well, due to implementing compensations for improved communication. PROMs completed today - CES-19/32 indicating that pt's awareness of his speech difficulties may have improved since November. Scores for conversation in a car and with strangers in a quiet place both were less than in November. EAT-10 was completed with Pat scoring himself 4/32 indicating low effect of eating on his QoL. SLP reviewed MBS with pt and Arland and SLP educated pt and Arland about one exercise for laryngeal closure (supersupraglottic), which Arland stated she could assist pt PRN. Pat req'd usual mod A including SLP model faded to min cues rarely for sequencing. SLP to follow every other week for 3-4 visits to ensure pt success with completion of this exercise.  08/26/24: Arland still endorses she needs hearing aids, re: hearing and understanding Pat. However both report it is going better  due to compensatory measures. Pt and wife cannot think of any other areas necessary for SLP to assist with at this time as communication has improved due to compensations. CES was completed today with results as above. This was completed for a baseline for future therapy courses.  [name] would like to participate in a MBS as he continues to cough with liquids and solids, and regularly with saliva during the day. As plan of care was signed off by referring MD and included MBS, SLP passed along referral for this today to Dr. Evonnie. Pt will schedule next visit after MBS.  Pat told SLP two strategies for memory that he could use, with mod I.   08/12/24: Pt and wfe deny  difficulties communicating at home since evaluation; they are talking in the same room and speaking to each other. [name] stated when he wants to talk with Arland I just get up and head that way. SLP reviewed memory strategies with pt and wife. Suggested pt/wife use [employer] home for reminders and alarms. Also provided some suggestions about other apps on iPhone for pt to use to aid memory [employer], [employer]). Arland is using nonverbal and verbal gestures - SLP told her/them to track their effectiveness until next session.  Pt and wife suggested they return every other week. SLP agreed.  07/11/24: Initially Pat declined ST, however, agreed to x1/week x6 weeks ST after the evaluation when SLP included pt's wife in the session today, and collaborated with pt and wife about how to structure ST to benefit pt most. It was agreed that Arland would join sessions in order to learn things to perform at home so that pt can remain as intelligible as possible in that setting, and learn some good cues when she and pt are out of the home to assist listener's comprehension of Pat's message. SLP will also work with [name] in session to improve muscle memory for louder (WNL) speech. SLP may also include dysphagia tasks such as clinical swallow eval, and/or MBS. Pat agreed to x1/week x6 weeks.    PATIENT EDUCATION: Education details: see today's treatment Person educated: Patient Education method: Explanation, Demonstration, and Verbal cues Education comprehension: verbalized understanding, returned demonstration, verbal cues required, and needs further education  HOME EXERCISE PROGRAM: Eventually, HEP for louder speech   GOALS: Goals reviewed with patient? No  SHORT TERM GOALS: =LONG TERM GOALS: Target date:   11/22/24  Pt will undergo MBS if clinically indicated Baseline:  Goal status: met  2.  Pt will produce 5 minutes conversation with average volume 69dB given occasional nonverbal cues over 2  sessions Baseline:  Goal status: d/c per pt/wife satisfied with pt's speech  3.  To improve Pat's intelligibility at home and in the community, Arland will demo successful verbal and/or nonverbal cues that improve pt's speech volume in or between 2 sessions (as reported by pt or Arland) Baseline:  Goal status: d/c, as pt/wife satisfied with pt's speech  4.  Pt's PROM will show improvement from initial administration  Baseline:  Goal status: INITIAL   5. Pt will tell SLP 2 memory strategies with mod I    Baseline:   Goal status: met    6.  Pt will complete supersupraglottic with mod I in two sessions  Baseline:  Goal status: INITIAL  ASSESSMENT:  CLINICAL IMPRESSION: Patient is a 73y.o. M who was seen today for treatment of swallowing skills in light of his Parkinson disease diagnosed approx 9 years ago. See treatment date above  for today's date for further details on today's session. Pt req'd SLP rare min A for supersupraglottic swallow. MBS was completed 09/17/24 with results above under diagnostic information.  Pt and wife cont to make compensations for pt's speech volume and continue satisfied with this. Therefore SLP d/c'd speech goals..   OBJECTIVE IMPAIRMENTS: Objective impairments include attention, memory, dysarthria, and dysphagia. These impairments are limiting patient from managing medications, managing appointments, managing finances, household responsibilities, ADLs/IADLs, effectively communicating at home and in community, and safety when swallowing.Factors affecting potential to achieve goals and functional outcome are ability to learn/carryover information, cooperation/participation level, and previous level of function.. Patient will benefit from skilled SLP services to address above impairments and improve overall function.  REHAB POTENTIAL: Good  PLAN:  SLP FREQUENCY: every approx 4 weeks  SLP DURATION: 4 sessions  PLANNED INTERVENTIONS: Aspiration precaution  training, Pharyngeal strengthening exercises, Diet toleration management , Environmental controls, Trials of upgraded texture/liquids, Cueing hierachy, Internal/external aids, SLP instruction and feedback, Compensatory strategies, Patient/family education, and [id number] Treatment of swallowing function    Lashondra Vaquerano, CCC-SLP 10/15/2024, 5:43 PM      "

## 2024-10-23 ENCOUNTER — Ambulatory Visit

## 2024-10-28 ENCOUNTER — Ambulatory Visit: Admitting: Podiatry

## 2024-11-03 ENCOUNTER — Other Ambulatory Visit: Payer: Self-pay | Admitting: Neurology

## 2024-11-03 DIAGNOSIS — F330 Major depressive disorder, recurrent, mild: Secondary | ICD-10-CM

## 2024-11-04 ENCOUNTER — Encounter: Payer: Self-pay | Admitting: Podiatry

## 2024-11-04 ENCOUNTER — Ambulatory Visit

## 2024-11-04 ENCOUNTER — Ambulatory Visit: Admitting: Podiatry

## 2024-11-04 VITALS — Ht 71.0 in | Wt 176.2 lb

## 2024-11-04 DIAGNOSIS — M7752 Other enthesopathy of left foot: Secondary | ICD-10-CM | POA: Diagnosis not present

## 2024-11-04 NOTE — Progress Notes (Signed)
" ° °  Chief Complaint  Patient presents with   Foot Pain    Pt is here due to pain in the left foot, in between the big and 2nd toe, he states it feels like something is loose.    HPI: 74 y.o. malepresenting for evaluation of infrequent intermittent numb type sensation to the first intermetatarsal space of the left foot.  He reports that this has only happened about 3 times over the past 6 months.  It will persist for about half a day and then resolved.  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 x2    Allergies[1]   Physical Exam: General: The patient is alert and oriented x3 in no acute distress.  Dermatology: Skin is warm, dry and supple bilateral lower extremities.   Vascular: Palpable pedal pulses bilaterally. Capillary refill within normal limits.  No appreciable edema.  No erythema.  Neurological: Grossly intact via light touch  Musculoskeletal Exam: No pedal deformities noted  Radiographic Exam LT foot 11/04/2024:  Normal osseous mineralization. Joint spaces preserved.  No fractures or irregularities noted.  Impression: Negative  Assessment/Plan of Care: 1.  Intermittent neuritis left first intermetatarsal space; currently asymptomatic  - Patient evaluated.  X-rays reviewed -Currently it is asymptomatic.  I do not believe there is anything alarming is concerning.  This  has only happened about 3 episodes over the past 6 months.  It seems to last for about half a day and then it resolves -Recommend good supportive tennis shoes and sneakers -Return to clinic PRN     Thresa EMERSON Sar, DPM [facility]  Dr. Thresa EMERSON Sar, DPM    [address] [place], [place] [address]                Office [phone]  Fax [phone]        [1] No Known Allergies  "

## 2024-11-06 ENCOUNTER — Ambulatory Visit

## 2024-11-12 ENCOUNTER — Ambulatory Visit

## 2024-11-20 ENCOUNTER — Ambulatory Visit

## 2024-11-28 ENCOUNTER — Ambulatory Visit: Admitting: Neurology

## 2024-12-12 ENCOUNTER — Telehealth: Admitting: Neurology
